# Patient Record
Sex: Male | Born: 1935 | Race: Black or African American | Hispanic: No | Smoking: Former smoker
Health system: Southern US, Community
[De-identification: ages and names within clinical notes are randomized; demographics above are authoritative.]

## PROBLEM LIST (undated history)

## (undated) DIAGNOSIS — I2699 Other pulmonary embolism without acute cor pulmonale: Secondary | ICD-10-CM

## (undated) DIAGNOSIS — I4891 Unspecified atrial fibrillation: Secondary | ICD-10-CM

## (undated) DIAGNOSIS — K219 Gastro-esophageal reflux disease without esophagitis: Secondary | ICD-10-CM

## (undated) DIAGNOSIS — R270 Ataxia, unspecified: Secondary | ICD-10-CM

## (undated) DIAGNOSIS — F329 Major depressive disorder, single episode, unspecified: Secondary | ICD-10-CM

## (undated) DIAGNOSIS — C61 Malignant neoplasm of prostate: Secondary | ICD-10-CM

## (undated) DIAGNOSIS — S7290XA Unspecified fracture of unspecified femur, initial encounter for closed fracture: Secondary | ICD-10-CM

## (undated) DIAGNOSIS — F32A Depression, unspecified: Secondary | ICD-10-CM

## (undated) DIAGNOSIS — F0390 Unspecified dementia without behavioral disturbance: Secondary | ICD-10-CM

---

## 2005-10-26 ENCOUNTER — Ambulatory Visit: Payer: Self-pay

## 2006-06-11 ENCOUNTER — Ambulatory Visit: Payer: Self-pay | Admitting: Internal Medicine

## 2006-12-31 ENCOUNTER — Ambulatory Visit: Payer: Self-pay | Admitting: Otolaryngology

## 2008-06-07 ENCOUNTER — Ambulatory Visit: Payer: Self-pay | Admitting: Internal Medicine

## 2009-01-10 ENCOUNTER — Ambulatory Visit: Payer: Self-pay | Admitting: Neurology

## 2010-10-11 ENCOUNTER — Ambulatory Visit: Payer: Self-pay | Admitting: Pain Medicine

## 2010-10-18 ENCOUNTER — Ambulatory Visit: Payer: Self-pay | Admitting: Pain Medicine

## 2010-10-31 ENCOUNTER — Emergency Department: Payer: Self-pay | Admitting: Emergency Medicine

## 2010-11-01 ENCOUNTER — Ambulatory Visit: Payer: Self-pay | Admitting: Pain Medicine

## 2010-11-14 ENCOUNTER — Inpatient Hospital Stay: Payer: Self-pay | Admitting: Internal Medicine

## 2010-11-16 DIAGNOSIS — I2699 Other pulmonary embolism without acute cor pulmonale: Secondary | ICD-10-CM | POA: Insufficient documentation

## 2010-11-16 LAB — PSA

## 2010-12-03 ENCOUNTER — Other Ambulatory Visit: Payer: Self-pay | Admitting: Geriatric Medicine

## 2014-06-24 DIAGNOSIS — G309 Alzheimer's disease, unspecified: Secondary | ICD-10-CM

## 2014-06-24 DIAGNOSIS — F0150 Vascular dementia without behavioral disturbance: Secondary | ICD-10-CM | POA: Insufficient documentation

## 2014-06-24 DIAGNOSIS — F0280 Dementia in other diseases classified elsewhere without behavioral disturbance: Secondary | ICD-10-CM

## 2014-10-05 DIAGNOSIS — R270 Ataxia, unspecified: Secondary | ICD-10-CM | POA: Insufficient documentation

## 2014-10-05 DIAGNOSIS — W19XXXA Unspecified fall, initial encounter: Secondary | ICD-10-CM | POA: Insufficient documentation

## 2015-01-10 ENCOUNTER — Observation Stay
Admit: 2015-01-10 | Disposition: A | Discharge status: Home / Self Care | Payer: Self-pay | Attending: Internal Medicine | Admitting: Internal Medicine

## 2015-01-10 LAB — COMPREHENSIVE METABOLIC PANEL
ALBUMIN: 3.6 g/dL
ANION GAP: 8 (ref 7–16)
Alkaline Phosphatase: 78 U/L
BILIRUBIN TOTAL: 0.6 mg/dL
BUN: 19 mg/dL
Calcium, Total: 8.6 mg/dL — ABNORMAL LOW
Chloride: 102 mmol/L
Co2: 27 mmol/L
Creatinine: 1.4 mg/dL — ABNORMAL HIGH
GFR CALC AF AMER: 51 — AB
GFR CALC NON AF AMER: 44 — AB
Glucose: 117 mg/dL — ABNORMAL HIGH
Potassium: 4 mmol/L
SGOT(AST): 45 U/L — ABNORMAL HIGH
SGPT (ALT): 7 U/L — ABNORMAL LOW
Sodium: 137 mmol/L
TOTAL PROTEIN: 6.9 g/dL

## 2015-01-10 LAB — URINALYSIS, COMPLETE
BILIRUBIN, UR: NEGATIVE
Bacteria: NONE SEEN
Glucose,UR: NEGATIVE mg/dL (ref 0–75)
Leukocyte Esterase: NEGATIVE
Nitrite: NEGATIVE
Ph: 5 (ref 4.5–8.0)
Protein: NEGATIVE
Specific Gravity: 1.032 (ref 1.003–1.030)

## 2015-01-10 LAB — CBC WITH DIFFERENTIAL/PLATELET
Basophil #: 0.1 10*3/uL (ref 0.0–0.1)
Basophil %: 0.7 %
EOS PCT: 0 %
Eosinophil #: 0 10*3/uL (ref 0.0–0.7)
HCT: 43.8 % (ref 40.0–52.0)
HGB: 14.4 g/dL (ref 13.0–18.0)
Lymphocyte #: 1.7 10*3/uL (ref 1.0–3.6)
Lymphocyte %: 20.6 %
MCH: 31.1 pg (ref 26.0–34.0)
MCHC: 32.8 g/dL (ref 32.0–36.0)
MCV: 95 fL (ref 80–100)
MONOS PCT: 14.8 %
Monocyte #: 1.2 x10 3/mm — ABNORMAL HIGH (ref 0.2–1.0)
NEUTROS PCT: 63.9 %
Neutrophil #: 5.1 10*3/uL (ref 1.4–6.5)
PLATELETS: 195 10*3/uL (ref 150–440)
RBC: 4.61 10*6/uL (ref 4.40–5.90)
RDW: 12.9 % (ref 11.5–14.5)
WBC: 8 10*3/uL (ref 3.8–10.6)

## 2015-01-10 LAB — LACTIC ACID, PLASMA: Lactic Acid, Venous: 2.8 mmol/L

## 2015-01-10 LAB — TROPONIN I: Troponin-I: <0.03 ng/mL

## 2015-01-10 LAB — PROTIME-INR
INR: 1.7
Prothrombin Time: 19.7 secs — ABNORMAL HIGH

## 2015-01-11 LAB — CBC WITH DIFFERENTIAL/PLATELET
BASOS ABS: 0 10*3/uL (ref 0.0–0.1)
Basophil %: 0.3 %
EOS PCT: 0 %
Eosinophil #: 0 10*3/uL (ref 0.0–0.7)
HCT: 40 % (ref 40.0–52.0)
HGB: 13.6 g/dL (ref 13.0–18.0)
LYMPHS PCT: 10.8 %
Lymphocyte #: 1 10*3/uL (ref 1.0–3.6)
MCH: 31.9 pg (ref 26.0–34.0)
MCHC: 34 g/dL (ref 32.0–36.0)
MCV: 94 fL (ref 80–100)
MONOS PCT: 13.7 %
Monocyte #: 1.3 x10 3/mm — ABNORMAL HIGH (ref 0.2–1.0)
Neutrophil #: 7.3 10*3/uL — ABNORMAL HIGH (ref 1.4–6.5)
Neutrophil %: 75.2 %
PLATELETS: 169 10*3/uL (ref 150–440)
RBC: 4.26 10*6/uL — AB (ref 4.40–5.90)
RDW: 12.9 % (ref 11.5–14.5)
WBC: 9.7 10*3/uL (ref 3.8–10.6)

## 2015-01-11 LAB — BASIC METABOLIC PANEL
ANION GAP: 6 — AB (ref 7–16)
BUN: 15 mg/dL
Calcium, Total: 8 mg/dL — ABNORMAL LOW
Chloride: 106 mmol/L
Co2: 26 mmol/L
Creatinine: 1.02 mg/dL
Glucose: 100 mg/dL — ABNORMAL HIGH
Potassium: 3.7 mmol/L
SODIUM: 138 mmol/L

## 2015-01-12 ENCOUNTER — Other Ambulatory Visit: Payer: Self-pay | Admitting: Physician Assistant

## 2015-01-12 DIAGNOSIS — I471 Supraventricular tachycardia: Secondary | ICD-10-CM

## 2015-01-12 DIAGNOSIS — R4182 Altered mental status, unspecified: Secondary | ICD-10-CM | POA: Diagnosis not present

## 2015-01-12 DIAGNOSIS — I480 Paroxysmal atrial fibrillation: Secondary | ICD-10-CM

## 2015-01-12 DIAGNOSIS — N179 Acute kidney failure, unspecified: Secondary | ICD-10-CM | POA: Diagnosis not present

## 2015-01-12 LAB — BASIC METABOLIC PANEL
Anion Gap: 5 — ABNORMAL LOW (ref 7–16)
BUN: 14 mg/dL
CALCIUM: 7.9 mg/dL — AB
CO2: 26 mmol/L
Chloride: 109 mmol/L
Creatinine: 0.91 mg/dL
Glucose: 88 mg/dL
POTASSIUM: 3.8 mmol/L
Sodium: 140 mmol/L

## 2015-01-12 LAB — CBC WITH DIFFERENTIAL/PLATELET
Basophil #: 0 10*3/uL (ref 0.0–0.1)
Basophil %: 0.3 %
Eosinophil #: 0 10*3/uL (ref 0.0–0.7)
Eosinophil %: 0.1 %
HCT: 38.2 % — ABNORMAL LOW (ref 40.0–52.0)
HGB: 12.8 g/dL — ABNORMAL LOW (ref 13.0–18.0)
Lymphocyte #: 2.2 10*3/uL (ref 1.0–3.6)
Lymphocyte %: 20.8 %
MCH: 31.3 pg (ref 26.0–34.0)
MCHC: 33.4 g/dL (ref 32.0–36.0)
MCV: 94 fL (ref 80–100)
Monocyte #: 1.4 x10 3/mm — ABNORMAL HIGH (ref 0.2–1.0)
Monocyte %: 13.6 %
Neutrophil #: 6.8 10*3/uL — ABNORMAL HIGH (ref 1.4–6.5)
Neutrophil %: 65.2 %
PLATELETS: 145 10*3/uL — AB (ref 150–440)
RBC: 4.07 10*6/uL — ABNORMAL LOW (ref 4.40–5.90)
RDW: 12.8 % (ref 11.5–14.5)
WBC: 10.4 10*3/uL (ref 3.8–10.6)

## 2015-01-12 LAB — MAGNESIUM: MAGNESIUM: 1.7 mg/dL

## 2015-01-12 LAB — TSH: Thyroid Stimulating Horm: 2.477 u[IU]/mL

## 2015-01-12 LAB — CULTURE, BLOOD (SINGLE)

## 2015-01-12 LAB — PROTIME-INR
INR: 2
PROTHROMBIN TIME: 22.7 s — AB

## 2015-01-13 LAB — PROTIME-INR
INR: 2.3
Prothrombin Time: 25.2 secs — ABNORMAL HIGH

## 2015-01-14 ENCOUNTER — Other Ambulatory Visit: Payer: Self-pay

## 2015-01-14 LAB — PROTIME-INR
INR: 3.3
Prothrombin Time: 33.6 secs — ABNORMAL HIGH

## 2015-01-16 NOTE — H&P (Signed)
PATIENT NAME:  Jorge Moore, Jorge Moore MR#:  [id number] DATE OF BIRTH:  [birth year]  DATE OF ADMISSION:  01/10/2015  REFERRING PHYSICIAN:  Wandra Arthurs, MD    PRIMARY CARE PHYSICIAN:  Cheral Marker. Ola Spurr, MD    CHIEF COMPLAINT: Weakness.   HISTORY OF PRESENT ILLNESS: This is an 81 year old African American gentleman who is from [employer], history of dementia, chronic atrial fibrillation presenting with weakness. The patient unable to provide any meaningful information given mental status and medical condition given mental status at baseline. History obtained from family present at the bedside and they are stating that he has had progressively worsening weakness for the last 1 to 2 day duration as well as associated cough of 1 duration which has been nonproductive. No fevers, chills, further symptomatology that they have noted. He was sent to the Emergency Department for worsening weakness from [facility]. On arrival noted be in atrial fibrillation, right ventricular response, heart rate 140s, received Cardizem x 1 with marked improvement of his heart rate.   REVIEW OF SYSTEMS: Unobtainable given patient's mental status and medical condition.   PAST MEDICAL HISTORY: Includes chronic atrial fibrillation, on warfarin for anticoagulation, as well as dementia with poor short-term memory, history of throat cancer many years ago.   SOCIAL HISTORY: No alcohol, tobacco, or drug usage. Resides at [facility]. Uses a walker for ambulation.   FAMILY HISTORY: No known cardiovascular or pulmonary disorders.   ALLERGIES: PENICILLIN.   HOME MEDICATIONS: Include acetaminophen 325 mg 2 tablets p.o. q. 4 hours as needed, acetaminophen/hydrocodone 325/5 mg 1 to 2 tablets every 4 hours as needed, magnesium hydroxide 30 mL every 12 hours as needed, warfarin 7.5 mg p.o. q. daily, fluoxetine 10 mg p.o. q. daily, Aricept 10 mg p.o. q. daily, calcitonin 200 international units nasal  spray daily, Colace 100 mg p.o. b.i.d., Namenda 10 mg p.o. daily, Protonix  40 mg p.o. q. daily, calcium plus vitamin D 500/200 p.o. b.i.d.   PHYSICAL EXAMINATION:  VITAL SIGNS: Temperature 98.7, heart rate 101, respirations 22, blood pressure 122/83, saturating well on supplemental O2, weight 71.2 kg, BMI 24.6.  GENERAL: Chronically ill, frail-appearing African American  gentleman currently in minimal distress given mental status.  HEAD: Normocephalic, atraumatic.  EYES: Pupils equal, round, react to light. Extraocular muscles intact. No scleral icterus.  MOUTH: Dry mucosal membrane. Dentition poor. No abscess noted.  EAR, NOSE, THROAT: Clear without exudates. No external lesions.  NECK: Supple. No thyromegaly. No nodules. No JVD.  PULMONARY: Diminished breath sounds throughout all  lung fields secondary to poor respiratory effort, however, no frank wheezes, rales, or rhonchi. No use of accessory muscles. Poor air entry as stated above.  CHEST: Nontender to palpation.  CARDIOVASCULAR: S1, S2, irregular rate, irregular rhythm. No murmurs, rubs, or gallops. No edema. Pedal pulses 2+ bilaterally. GASTROINTESTINAL: Soft, nontender, nondistended. No masses. Positive bowel sounds. No hepatosplenomegaly.  MUSCULOSKELETAL: No swelling, clubbing, or edema. Range of motion full in all extremities.  NEUROLOGIC: Cranial nerves II through XII intact. No gross focal neurological deficit. Sensation intact. Reflexes intact.  SKIN: No ulceration, lesions, rash, cyanosis. Skin warm and dry. Turgor intact.  PSYCHIATRIC: Mood, affect flat. He is awake, alert, oriented to person only. Insight, judgment poor.   LABORATORY DATA: A multitude of x-rays. Chest x-ray performed: Left sided atelectasis. CT head performed: No acute findings. CT chest performed: No PE, progressive scarring of the lingula anterolateral left lower lobe, minimal bibasilar atelectasis.   Remainder of  laboratory data: Sodium 137, potassium 4,  chloride 102, bicarbonate 27, BUN 19, creatinine 1.4, glucose 117. LFTs: AST of 45, otherwise, within normal limits. WBC of 8, hemoglobin of 14.4, platelets of 195,000. Urinalysis negative for evidence of infection.   ASSESSMENT AND PLAN: An 81 year old African American  gentleman with history of dementia as well as chronic atrial fibrillation  presenting with weakness.  1.  Acute kidney injury. IV  fluid hydration, follow urine output and renal function.  2.  Atrial fibrillation with rapid ventricular response transiently.   place on telemetry, Cardizem if required, continue anticoagulation. Check an INR.  4.  Generalized weakness. We will consult physical therapy.  5.  Venous thromboembolic prophylaxis with therapeutic warfarin.   CODE STATUS: The patient is full code.   TIME SPENT: 45 minutes.   ____________________________ Aaron Mose. Hower, MD dkh:AT D: 01/11/2015 00:07:12 ET T: 01/11/2015 00:55:58 ET JOB#: [id number]  cc: Aaron Mose. Hower, MD, <Dictator> DAVID Woodfin Ganja MD ELECTRONICALLY SIGNED 01/13/2015 10:43

## 2015-01-16 NOTE — Consult Note (Signed)
General Aspect Primary Cardiologist: New to [facility] ____________  81 year old male with history of PAF on Coumadin, PE 11/2010, dementia, poor short-term memory, prostate CA s/p prostatectomy, DM2, remote history of throat CA, and glaucoma who presented to [facility] on 4/26 with 1-2 day history of generalized weakness, fatigue, and cough.  ____________  PMH: 1. PAF on Coumadin 2. PE 11/2010 3. Dementia 4. Poor short term memory 5. Prostate CA s/p prostatectomy 6. DM2 7. Remote history of throat CA 8. Glaucoma ____________   Present Illness 81 year old male with the above problem list who [facility] above CC. He has known history of PAF on Coumadin that his managed by his PCP. There is no known ischemic history on him. He has never seen a cardiologist. The history is taken from the prior notes and his daughter. He is not listed as being on any rate controlling medications for his a-fib in [place] or [place], only warfarin.   He comes in with a 1-2 day history of gneralized weakness, fatigue, and cough, which has been nonproductive. No fevers, chills, or other symptoms.  On arrival noted be in atrial fibrillation, right ventricular response, heart rate 140s, received Cardizem x 1 with marked improvement of his heart rate. Labs showed lactic acid 2.8, SCr 1.40, troponin 0.03, blood culture negative x 2 to date, CXR showed no acute process, chronic scarring. CT head without acute proces, and with plaque along the carotid bulbs. CT chest for PE was negative. It did show mild CAD, and atlecetasis. Today it was observed on tele that he had a short episode of SVT with abberancy, then went back into NSR with HR in the 70s. EKG on 4/25 showed SVT with HR 153. He was apparently asymptomatic. Vitals stable.   Physical Exam:  GEN frail appearing   HEENT PERRL, hearing intact to voice, moist oral mucosa, drooping face at ba[place] - corrects with active motion   NECK supple  no JVD   RESP normal  resp effort  wheezing  rhonchi   CARD Regular rate and rhythm  No murmur   ABD denies tenderness  soft   EXTR negative edema   SKIN normal to palpation   NEURO motor/sensory function intact   PSYCH lethargic   Review of Systems:  ROS Pt not able to provide ROS  dementia   Medications/Allergies Reviewed Medications/Allergies reviewed   Family & Social History:  Family and Social History:  Family History dementia   Social History negative tobacco, negative ETOH, negative Illicit drugs   Place of Living Nursing Home     Atrial Fibrillation:    Alzheimer's Disease:    Throat Cancer: radiation therapy   Prostatectomy: prostate cancer  Home Medications: Medication Instructions Status  fluoxetine 10 mg oral capsule 1  orally once a day  Active  Aricept 10 mg oral tablet 1  orally once a day  Active  Namenda 10 mg oral tablet 1  orally once a day  Active  Protonix 40 mg granule, enteric coated 1 ea orally once a day  Active  acetaminophen-HYDROcodone 325 mg-5 mg tablet 1  to 2 tab(s) orally every 4 hours as needed    Active  calcitonin 200 intl units/inh spray 1 spray(s) nasal once a day  Active  calcium (as carbonate)-vitamin D 500 mg-200 intl units oral tablet 1 tab(s) orally 2 times a day (with meals)  Active  docusate sodium 100 mg capsule 1 cap(s) orally 2 times a  day  Active  magnesium hydroxide 8% suspension 30 mL orally every 12 hours as needed   Active  acetaminophen 325 mg tablet 2 tab(s) orally every 4 hours as needed   Active  warfarin 7.5 mg tablet 1 tab(s) orally once a day  Active   Lab Results:  Routine Chem:  27-Apr-16 07:22   Glucose, Serum 88 (65-99 NOTE: New Reference Range  11/23/14)  BUN 14 (6-20 NOTE: New Reference Range  11/23/14)  Creatinine (comp) 0.91 (0.61-1.24 NOTE: New Reference Range  11/23/14)  Sodium, Serum 140 (135-145 NOTE: New Reference Range  11/23/14)  Potassium, Serum 3.8 (3.5-5.1 NOTE: New Reference Range  11/23/14)   Chloride, Serum 109 (101-111 NOTE: New Reference Range  11/23/14)  CO2, Serum 26 (22-32 NOTE: New Reference Range  11/23/14)  Calcium (Total), Serum  7.9 (8.9-10.3 NOTE: New Reference Range  11/23/14)  Anion Gap  5  eGFR (African American) >60  eGFR (Non-African American) >60 (eGFR values <48m/min/1.73 m2 may be an indication of chronic kidney disease (CKD). Calculated eGFR is useful in patients with stable renal function. The eGFR calculation will not be reliable in acutely ill patients when serum creatinine is changing rapidly. It is not useful in patients on dialysis. The eGFR calculation may not be applicable to patients at the low and high extremes of body sizes, pregnant women, and vegetarians.)  Routine Coag:  27-Apr-16 07:22   Prothrombin  22.7 (11.4-15.0 NOTE: New Reference Range  10/15/14)  INR 2.0 (INR reference interval applies to patients on anticoagulant therapy. A single INR therapeutic range for coumarins is not optimal for all indications; however, the suggested range for most indications is 2.0 - 3.0. Exceptions to the INR Reference Range may include: Prosthetic heart valves, acute myocardial infarction, prevention of myocardial infarction, and combinations of aspirin and anticoagulant. The need for a higher or lower target INR must be assessed individually. Reference: The Pharmacology and Management of the Vitamin K  antagonists: the seventh ACCP Conference on Antithrombotic and Thrombolytic Therapy. CALPFX.9024Sept:126 (3suppl): [id number] A HCT value >55% may artifactually increase the PT.  In one study,  the increase was an average of 25%. Reference:  "Effect on Routine and Special Coagulation Testing Values of Citrate Anticoagulant Adjustment in Patients with High HCT Values." American Journal of Clinical Pathology 2006;126:400-405.)  Routine Hem:  27-Apr-16 07:22   WBC (CBC) 10.4  RBC (CBC)  4.07  Hemoglobin (CBC)  12.8  Hematocrit (CBC)  38.2   Platelet Count (CBC)  145  MCV 94  MCH 31.3  MCHC 33.4  RDW 12.8  Neutrophil % 65.2  Lymphocyte % 20.8  Monocyte % 13.6  Eosinophil % 0.1  Basophil % 0.3  Neutrophil #  6.8  Lymphocyte # 2.2  Monocyte #  1.4  Eosinophil # 0.0  Basophil # 0.0 (Result(s) reported on 12 Jan 2015 at 08:25AM.)   Radiology Results: Cardiology:    25-Apr-16 18:30, ECG  Ventricular Rate 153  Atrial Rate 153  P-R Interval 168  QRS Duration 80  QT 316  QTc 504  R Axis 27  T Axis -106  ECG interpretation   Sinus tachycardia  Septal infarct , age undetermined  Marked ST abnormality, possible inferior subendocardial injury  Abnormal ECG  When compared with ECG of 14-Nov-2010 09:26,  Significant changes have occurred  ----------unconfirmed----------  Confirmed by OVERREAD, NOT ([id number]), editor PEARSON, BARBARA ([id number] on 01/11/2015 9:50:20 AM    25-Apr-16 20:58, ECG  Ventricular Rate 108  Atrial Rate 108  P-R Interval 172  QRS Duration 84  QT 332  QTc 444  P Axis 52  R Axis 38  T Axis 38  ECG interpretation   Sinus tachycardia  Nonspecific T wave abnormality  Abnormal ECG  When compared with ECG of 14-Nov-2010 09:26,  Vent. rate has increased BY  39 BPM  Nonspecific T wave abnormality, worse in Inferior leads  Nonspecific T wave abnormality now evident inAnterolateral leads  ----------unconfirmed----------  Confirmed by OVERREAD, NOT ([id number]), editor PEARSON, BARBARA ([id number]) on 01/11/2015 1:14:22 PM  ECG   CT:    25-Apr-16 19:39, CT Angiography Chest  CT Angiography Chest   REASON FOR EXAM:    SOB r/o PE  COMMENTS:       PROCEDURE: CT  - CT ANGIOGRAPHY CHEST W/CONTRAST  - Jan 10 2015  7:39PM     CLINICAL DATA:  Weakness and loss of balance. Fall with left leg  pain. Shortness of breath. History of throat cancer.    EXAM:  CT ANGIOGRAPHY CHEST WITH CONTRAST    TECHNIQUE:  Multidetector CT imaging of the chest was performed using the  standard protocol during bolus administration of  intravenous  contrast. Multiplanar CT image reconstructions and MIPs were  obtained toevaluate the vascular anatomy.    CONTRAST:  75 mL Omnipaque 350 IV    COMPARISON:  11/14/2010 and chest x-ray 01/10/2015, 11/16/2010    FINDINGS:  Lungs are well inflated and demonstrate scarring over the lingula  and anterior lateral left lower lobe slightly worse compared to  2012. Minimal bibasilar dependent atelectasis. No evidence of  effusion. Subtle increased peripheral interstitial markings over the  mid to upper lungs with slight progression compared to the previous  exam. Airways are within normal.    Heart is normal in size. There is calcified plaque over the left  anterior descending and lateral circumflex coronary arteries  unchanged. There is no evidence of pulmonary embolism. Minimal  prominence of the ascending thoracic aortameasuring 3.6 cm.  Calcified subcarinal lymph nodes are present. 1 cm precarinal lymph  node likely reactive. No hilar or axillary adenopathy. Moderate size  hiatal hernia unchanged.    Images through the upper abdomen are within normal. Moderate  compression fracture near the thoracolumbar junction unchanged.    Review of the MIP images confirms the above findings.     IMPRESSION:  No acute cardiopulmonary disease and no evidence of pulmonary  embolism.  Slightly progressive scarring over the lingula/anterior lateral left  lower lobe. Minimal bibasilar atelectasis. Slight interval  progression of mild peripheral increased interstitial markings over  the mid to upper lungs. 1 cm precarinal lymph node likely reactive.    Mild atherosclerotic coronary artery disease.    Stable moderate size hiatal hernia.    Mild prominence of the ascending thoracic aorta measuring 3.6 cm.  Recommend annual imaging followup by CTA or MRA. This recommendation  follows 2010 ACCF/AHA/AATS/ACR/ASA/SCA/SCAI/SIR/STS/SVM Guidelines  for the Diagnosis and Management of Patients  with Thoracic Aortic  Disease. Circulation.2010; 121: Z610-R604.  Stable compression fracture over the thoracolumbar junction.      Electronically Signed    By: Marin Olp M.D.    On:01/10/2015 20:29         Verified By: Pearletha Alfred, M.D.,    25-Apr-16 19:39, CT Head Without Contrast  CT Head Without Contrast   REASON FOR EXAM:    fall  COMMENTS:       PROCEDURE: CT  - CT HEAD WITHOUT CONTRAST  -  Jan 10 2015  7:39PM     CLINICAL DATA:  Post fall    EXAM:  CT HEAD WITHOUT CONTRAST    CT CERVICAL SPINE WITHOUT CONTRAST    TECHNIQUE:  Multidetector CT imaging of the head and cervical spine was  performed following the standard protocol without intravenous  contrast. Multiplanar CT image reconstructions of the cervical spine  were also generated.    COMPARISON:  01/10/2009    FINDINGS:  CT HEAD FINDINGS    Similar findings of atrophy with sulcal prominence and centralized  volume loss with commensurate ex vacuo dilatation of the ventricular  system. Unchanged encephalomalacia involving the medial aspect of  the left cerebellar hemisphere (image 8, series 2). Extensive  periventricular hypodensities compatible microvascular ischemic  disease. Unchanged bilateral basal ganglial calcifications. Given  extensive background parenchymal abnormalities, there is no CT  evidence of superimposed acute large territory infarct.    No intraparenchymal or extra-axial mass or hemorrhage. Unchanged  size and configuration of the ventricles and basilar cisterns. No  midline shift. Intracranial atherosclerosis. Limited visualization  of the paranasal sinuses and mastoid air cells is normal. No  air-fluid levels.    Regional soft tissues appear normal. Debris is noted within the  bilateral external auditory canals, right greater than left. No  displaced calvarial fracture.    CT CERVICAL SPINE FINDINGS    C1 to the superior endplate of T3 is imaged.  There is straightening  and reversal of the expected cervical  lordosis with kyphosis centered about the C5-C6 articulation. No  anterolisthesis or retrolisthesis. The bilateral facets are normally  aligned. The dens is normally positioned between the lateral masses  of C1. Moderate degenerative change of the atlantodental  articulation. Normal atlantoaxial articulations.    No fracture or static subluxation of the cervical spine. Cervical  vertebral body heights are preserved. Prevertebral soft tissues are  normal.    Moderate to severe multilevel cervical spine DDD, worse at C5-C6,  C6-C7 and C7-T1 with disc space height loss, endplate irregularity  and sclerosis.  Atherosclerotic plaque within the bilateral carotid bulbs, right  greater than left. No bulky cervical lymphadenopathy on this  noncontrast examination. Limited visualization lung apices is  normal.     IMPRESSION:  Head CT Impression:    1. No acute intracranial process  2. Similar findings of prior infarct involving the medial aspect of  the left cerebellum as well as advanced atrophy and microvascular  ischemic disease.  Cervical spine Impression:    1. No fracture static subluxation of the cervical spine.  2. Straightening and focal kyphosis centered about the C5-C6  articulation without associated anterolisthesis.  3. Moderate to severe multilevel cervical spine DDD, worse at C5-C6,  C6-C7 and C7-T1.  4. Atherosclerotic plaque within the bilateral carotid bulbs.  Further evaluation with nonemergent carotid Doppler ultrasound could  be performed as clinically indicated.      Electronically Signed    By: Sandi Mariscal M.D.    On: 01/10/2015 20:23         Verified By: Aileen Fass, M.D.,    Penicillin: Other  Vital Signs/Nurse's Notes: **Vital Signs.:   27-Apr-16 05:29  Vital Signs Type Routine  Temperature Temperature (F) 99.4  Celsius 37.4  Temperature Source oral  Pulse Pulse 74  Respirations Respirations 20   Systolic BP Systolic BP 989  Diastolic BP (mmHg) Diastolic BP (mmHg) 65  Mean BP 84  Pulse Ox % Pulse Ox % 93  Pulse Ox  Activity Level  At rest  Oxygen Delivery 2L    Impression 81 year old male with history of PAF on Coumadin, PE 11/2010, dementia, poor short-term memory, prostate CA s/p prostatectomy, DM2, remote history of throat CA, and glaucoma who [facility] on 4/[facility] with 1-2 day history of generalized weakness, fatigue, and cough.  1. SVT: -EKG 4/25 showed SVT with heart rate 153 -Telemetry today showed what appears to be short episode of SVT with abberancy before converting back to NSR with heart rate of 70s -Check TSH and Mg -Start Lopressor 12.5 mg bid -No echo needed at this time given his advanced dementia and short term memory loss (treatment would still be beta blocker, would not pursue ischemic evaluation if images suggested - daughter agrees)  2. PAF: -Given his advanced age and dementia could discontinue warfarin and likely treat with aspirin -Add Lopressor as above -In NSR currently  3. Altered mental status: -Fever earlier, now resolved -Work up unrevealing so far -Continue to monitor  4. Dementia: -Continue home medications -No aggressive interventions planned from cardiac standpoint  5. Acute renal insufficiency: -Improved   Plan ATTENDING ATTESTATION  I personally saw & examined the patient today with Mr. Idolina Primer, [place].  Together, we reviewed the chart & all available data & discussed findings & recommendations on rounds.  I agree with the note  On Tele - short run of a wider complex rhythm (~6 beats) with retrograde P waves -- morphology is more c/2w SVT with aberrancy, but cannot exclude short burst of NSVT.  He has PAF on warfarin & was in RVR on admission.   With age & dementia - would not be aggressive with evaluation after 1 short episode.  if he has recurrent episodes would consider Echo to exclude low EF / cardiomyopathy (simply b/c BB would be  preferred to CCB for Afib rate control).   BB is likely better than CCB for SVT - agree with low dose BB.    We will follow Tele along with you, but do not have much more to offer.  Will sign off & see PRN MD request.  DH   Electronic Signatures: Rise Mu (PA-C)  (Signed 27-Apr-16 14:18)  Authored: General Aspect/Present Illness, History and Physical Exam, Review of System, Family & Social History, Past Medical History, Home Medications, Labs, Radiology, Allergies, Vital Signs/Nurse's Notes, Impression/Plan Leonie Man (MD)  (Signed 27-Apr-16 20:05)  Authored: Impression/Plan  Co-Signer: General Aspect/Present Illness, History and Physical Exam, Review of System, Family & Social History, Past Medical History, Home Medications, Labs, Radiology, Allergies, Vital Signs/Nurse's Notes, Impression/Plan   Last Updated: 27-Apr-16 20:05 by Leonie Man (MD)

## 2015-01-17 NOTE — Discharge Summary (Addendum)
PATIENT NAME:  Jorge Moore, Jorge Moore MR#:  [id number] DATE OF BIRTH:  09/27/1923  DATE OF ADMISSION:  01/10/2015 DATE OF DISCHARGE:  01/14/2015  DISCHARGE DIAGNOSES: 1.  Paroxysmal supraventricular tachycardia. 2.  Atrial fibrillation with rapid ventricular response.  3.  Bereavement reaction.    CONSULTATIONS: Cardiology, Dr. Donivan Scull group.   CODE STATUS: FULL.   DISCHARGE MEDICATIONS: Aricept 10 mg p.o. once daily, fluoxetine 10 mg 1 tablet p.o. once daily, Namenda 10 mg once daily, Protonix 40 mg 1 tablet p.o. once daily, calcitonin 200 international units 1 spray nasally once daily, calcium with vitamin D 1 tablet p.o. 2 times a day, Colace 100 mg 1 capsule p.o. 2 times a day, magnesium hydroxide 30 mL p.o. every 12 hours as needed, Tylenol 325 mg 2 tablets p.o. every 4 hours as needed, metoprolol 12.5 mg p.o. b.i.d., Tylenol with Percocet 1 tablet p.o. every 4 hours as needed for pain.   DISPOSITION: Discharging home with home health, physical therapy, nurse aide, and hospice services. No need of home oxygen.  DISCHARGE DIET: Low fat, low cholesterol. Dietary supplement: Ensure 2 times per day.   DISCHARGE ACTIVITY: As recommended by physical therapy.   DISCHARGE FOLLOWUP: Followup appointment in 1 week with primary care physician and 2 to 3 weeks with cardiology, Dr. Rockey Situ.  BRIEF HISTORY AND PHYSICAL AND HOSPITAL COURSE: The patient is an 81 year old pleasant African American male who came into the ED with the chief complaint of generalized weakness. The patient is sent over from [place] facility. He has chronic history of Alzheimer dementia and chronic atrial fibrillation. Please review history and physical for details.   HOSPITAL COURSE BASED ON PROBLEM: 1.  Acute kidney injury. Started him on IV fluids. Urinary output and renal function was monitored. With IV fluids the patient's renal function is back to normal.  2.  A-fib with RVR. The patient was placed on  telemetry, Coumadin was continued, INR was monitored and Coumadin dose was adjusted by pharmacy. The plan was to provide Cardizem as needed basis but rate was well-controlled. Eventually, the patient was seen by cardiology, Dr. Donivan Scull group, and they have recommended to discontinue Coumadin and start him on aspirin in view of elderly age, high risk for falls and bleeding. TSH was normal.  3.  Altered mental status, significantly improved, but was thought to be from metabolic encephalopathy. Initially had low-grade fever x1, but no cultures were done. The thought was it could be from viral infection. UA was negative. CT head was negative. No pneumonia was noted on the chest x-ray so did not give any antibiotics. No other episodes of temperature was noticed, and the patient's physical condition and altered mental status was improved and yesterday he was talking and smiling, almost back to his baseline. That was on Feb 07, 2023, morning. 4.  Episode of paroxysmal SVT. Heart rate went up to 150s transiently per telemetry. Cardiology has seen the patient. The patient spontaneously converted back to A-fib so they have recommended small dose of metoprolol 12.5 mg p.o. b.i.d. and Coumadin was discontinued as he is at high risk for bleeding if he falls.  5.  Chronic history of Alzheimer dementia at baseline and he resides at Alzheimer locked unit at [facility]. The plan is to continue Aricept and Namenda as his mentation was much better.  6.  Bereavement reaction. The patient's wife suddenly passed on 02/07/23. The patient was sad following that, but he has very strong family support. The patient was  coping okay. He will be transferred back to [facility] with hospice care. Care management was consulted regarding that.  7.  Generalized weakness. The patient was seen by physical therapy. They have recommended skilled nursing care, but family cannot afford skilled nursing facility as insurance does not cover so  the patient is going back to [facility] with hospice care and home health with RN, PT and aide. This was discussed with the family members in detail. They are aware of the plan. I have discussed with his daughter. She is agreeable. If the patient is not improving, he might need outpatient psych followup.   CONDITION AT THE TIME OF DISCHARGE: Satisfactory.   LABORATORY AND IMAGING STUDIES: On the 27th, BMP was normal, except calcium is 7.9. TSH normal. Troponin less than 0.03. WBC 10.4, hemoglobin 12.8, hematocrit 38.2, and platelets 145,000. INR is 3.3 today. PT is 33.6. Coumadin was discontinued.   Blood culture with no growth.   Chest x-ray, PA and lateral: No evidence of acute cardiopulmonary disease.  Pelvic x-ray: No fracture or dislocation seen.   The plan of care was discussed with the patient and daughter. Daughter is agreeable.  TOTAL TIME SPENT ON DISCHARGE:  45 minutes.  ____________________________ Nicholes Mango, MD ag:sb D: 01/14/2015 16:30:45 ET T: 01/14/2015 17:00:04 ET JOB#: [id number]  cc: Nicholes Mango, MD, <Dictator> [place] [facility] Minna Merritts, MD  Nicholes Mango MD ELECTRONICALLY SIGNED 01/26/2015 14:22

## 2015-01-18 ENCOUNTER — Telehealth: Payer: Self-pay | Admitting: *Deleted

## 2015-01-18 NOTE — Telephone Encounter (Signed)
Spoke to patient daughter, I tried to make an apt for hosptial follow up,  She stated he is okay from heart stand point she would call if they had any issues.  Pt wife just passed away so they are handling this right now.

## 2015-01-20 ENCOUNTER — Emergency Department
Admission: EM | Admit: 2015-01-20 | Discharge: 2015-01-20 | Disposition: A | Discharge status: Home / Self Care | Attending: Emergency Medicine | Admitting: Emergency Medicine

## 2015-01-20 ENCOUNTER — Encounter: Payer: Self-pay | Admitting: Emergency Medicine

## 2015-01-20 ENCOUNTER — Other Ambulatory Visit: Payer: Self-pay

## 2015-01-20 ENCOUNTER — Emergency Department

## 2015-01-20 DIAGNOSIS — Y9389 Activity, other specified: Secondary | ICD-10-CM | POA: Diagnosis not present

## 2015-01-20 DIAGNOSIS — Y998 Other external cause status: Secondary | ICD-10-CM | POA: Insufficient documentation

## 2015-01-20 DIAGNOSIS — Z7901 Long term (current) use of anticoagulants: Secondary | ICD-10-CM | POA: Diagnosis not present

## 2015-01-20 DIAGNOSIS — W19XXXA Unspecified fall, initial encounter: Secondary | ICD-10-CM

## 2015-01-20 DIAGNOSIS — W1839XA Other fall on same level, initial encounter: Secondary | ICD-10-CM | POA: Diagnosis not present

## 2015-01-20 DIAGNOSIS — Y92129 Unspecified place in nursing home as the place of occurrence of the external cause: Secondary | ICD-10-CM

## 2015-01-20 DIAGNOSIS — Z79899 Other long term (current) drug therapy: Secondary | ICD-10-CM | POA: Insufficient documentation

## 2015-01-20 DIAGNOSIS — Y92128 Other place in nursing home as the place of occurrence of the external cause: Secondary | ICD-10-CM | POA: Diagnosis not present

## 2015-01-20 DIAGNOSIS — Z880 Allergy status to penicillin: Secondary | ICD-10-CM | POA: Diagnosis not present

## 2015-01-20 DIAGNOSIS — S79911A Unspecified injury of right hip, initial encounter: Secondary | ICD-10-CM | POA: Insufficient documentation

## 2015-01-20 DIAGNOSIS — M25551 Pain in right hip: Secondary | ICD-10-CM

## 2015-01-20 HISTORY — DX: Major depressive disorder, single episode, unspecified: F32.9

## 2015-01-20 HISTORY — DX: Malignant neoplasm of prostate: C61

## 2015-01-20 HISTORY — DX: Unspecified dementia, unspecified severity, without behavioral disturbance, psychotic disturbance, mood disturbance, and anxiety: F03.90

## 2015-01-20 HISTORY — DX: Gastro-esophageal reflux disease without esophagitis: K21.9

## 2015-01-20 HISTORY — DX: Depression, unspecified: F32.A

## 2015-01-20 LAB — COMPREHENSIVE METABOLIC PANEL
ALT: 11 U/L — AB (ref 17–63)
AST: 27 U/L (ref 15–41)
Albumin: 2.7 g/dL — ABNORMAL LOW (ref 3.5–5.0)
Alkaline Phosphatase: 60 U/L (ref 38–126)
Anion gap: 7 (ref 5–15)
BUN: 14 mg/dL (ref 6–20)
CALCIUM: 8.4 mg/dL — AB (ref 8.9–10.3)
CO2: 30 mmol/L (ref 22–32)
Chloride: 102 mmol/L (ref 101–111)
Creatinine, Ser: 0.94 mg/dL (ref 0.61–1.24)
GFR calc non Af Amer: >60 mL/min (ref >60)
GLUCOSE: 122 mg/dL — AB (ref 65–99)
Potassium: 3.8 mmol/L (ref 3.5–5.1)
Sodium: 139 mmol/L (ref 135–145)
TOTAL PROTEIN: 6.9 g/dL (ref 6.5–8.1)
Total Bilirubin: 2.1 mg/dL — ABNORMAL HIGH (ref 0.3–1.2)

## 2015-01-20 LAB — CBC
HCT: 36.5 % — ABNORMAL LOW (ref 40.0–52.0)
HEMOGLOBIN: 11.9 g/dL — AB (ref 13.0–18.0)
MCH: 30.7 pg (ref 26.0–34.0)
MCHC: 32.5 g/dL (ref 32.0–36.0)
MCV: 94.4 fL (ref 80.0–100.0)
PLATELETS: 347 10*3/uL (ref 150–440)
RBC: 3.87 MIL/uL — AB (ref 4.40–5.90)
RDW: 12.5 % (ref 11.5–14.5)
WBC: 12.7 10*3/uL — ABNORMAL HIGH (ref 3.8–10.6)

## 2015-01-20 LAB — URINALYSIS COMPLETE WITH MICROSCOPIC (ARMC ONLY)
BACTERIA UA: NONE SEEN
Bilirubin Urine: NEGATIVE
GLUCOSE, UA: NEGATIVE mg/dL
LEUKOCYTES UA: NEGATIVE
NITRITE: NEGATIVE
Protein, ur: NEGATIVE mg/dL
SPECIFIC GRAVITY, URINE: 1.025 (ref 1.005–1.030)
pH: 6 (ref 5.0–8.0)

## 2015-01-20 LAB — TROPONIN I

## 2015-01-20 LAB — PROTIME-INR
INR: 2.91
Prothrombin Time: 30.5 seconds — ABNORMAL HIGH (ref 11.4–15.0)

## 2015-01-20 NOTE — ED Notes (Signed)
Patient is resting comfortably. 

## 2015-01-20 NOTE — ED Notes (Signed)
Pt to ed from [facility] with reports of getting tangled up in his sheets this am and falling. Pt grimaces when palpating right hip area. Pt with dementia and is on hospice care for FTT after recently losing his wife. Pt a/o on arrival to ed with edp at bedside.

## 2015-01-20 NOTE — ED Notes (Signed)
Family at bedside. 

## 2015-01-20 NOTE — ED Notes (Signed)
Pt will be transported back to [facility] via EMS>

## 2015-01-20 NOTE — ED Notes (Signed)
Patient resting in stretcher. Respirations even and unlabored. No obvious distress. Cardiac monitor in place. No needs/concerns verbalized at this time. Family at bedside. Call bell within reach. Encouraged to call with needs. Will continue to monitor.

## 2015-01-20 NOTE — Discharge Instructions (Signed)

## 2015-01-20 NOTE — ED Notes (Signed)
Xray completed and negative. Cont to monitor.

## 2015-01-20 NOTE — ED Provider Notes (Signed)
[facility] Emergency Department Provider Note    ____________________________________________  Time seen: 7:15 AM, on arrival  I have reviewed the triage vital signs and the nursing notes.   HISTORY  Chief Complaint No chief complaint on file.       HPI Jorge Moore is a 81 y.o. male who presents from [place] home. Apparently he recently entered hospice for failure to thrive. Patient was found next to his bed tangled in blankets. Nursing home staff suspects unwitnessed fall. Patient states he hurts all over but primarily in his right hip. The pain is moderate 5 out of 10 and sharp with movement. Holding the leg still makes the pain better but movement hurts.  No past medical history on file.  There are no active problems to display for this patient.   No past surgical history on file.  Current Outpatient Rx  Name  Route  Sig  Dispense  Refill  . acetaminophen (TYLENOL) 325 MG tablet   Oral   Take 650 mg by mouth every 4 (four) hours as needed.         . Calcium Carbonate-Vitamin D (CALCIUM-VITAMIN D) 500-200 MG-UNIT per tablet   Oral   Take 1 tablet by mouth 2 (two) times daily with a meal.         . docusate sodium (COLACE) 100 MG capsule   Oral   Take 100 mg by mouth 2 (two) times daily.         Marland Kitchen donepezil (ARICEPT) 10 MG tablet   Oral   Take 10 mg by mouth at bedtime.         Marland Kitchen FLUoxetine (PROZAC) 10 MG capsule   Oral   Take 10 mg by mouth daily.         Marland Kitchen HYDROcodone-acetaminophen (NORCO/VICODIN) 5-325 MG per tablet   Oral   Take 1 tablet by mouth every 4 (four) hours as needed for moderate pain.         . magnesium hydroxide (MILK OF MAGNESIA) 800 MG/5ML suspension   Oral   Take 30 mLs by mouth every 12 (twelve) hours as needed for constipation.         . memantine (NAMENDA) 10 MG tablet   Oral   Take 10 mg by mouth daily.         . NON FORMULARY   Nasal   Place 200 Int'l Units into the  nose daily. Calcitonin         . pantoprazole (PROTONIX) 40 MG tablet   Oral   Take 40 mg by mouth daily.         Marland Kitchen warfarin (COUMADIN) 7.5 MG tablet   Oral   Take 7.5 mg by mouth daily.           Allergies Penicillins  No family history on file.  Social History History  Substance Use Topics  . Smoking status: Not on file  . Smokeless tobacco: Not on file  . Alcohol Use: Not on file    Review of Systems  Constitutional: Negative for fever. Eyes: Negative for visual changes. ENT: Negative for sore throat. Cardiovascular: Negative for chest pain. Respiratory: Negative for shortness of breath. Gastrointestinal: Negative for abdominal pain, vomiting and diarrhea. Genitourinary: Negative for dysuria. Musculoskeletal: Negative for back pain. Positive for right hip pain Skin: Negative for rash. Neurological: Negative for headaches, focal weakness or numbness.   10-point ROS otherwise negative.  ____________________________________________   PHYSICAL EXAM:  VITAL SIGNS:  ED Triage Vitals  Enc Vitals Group     BP --      Pulse --      Resp --      Temp --      Temp src --      SpO2 --      Weight --      Height --      Head Cir --      Peak Flow --      Pain Score --      Pain Loc --      Pain Edu? --      Excl. in [place]? --      Constitutional: Alert and oriented. Well appearing and in no distress. Eyes: Conjunctivae are normal. PERRL. Normal extraocular movements. ENT   Head: Normocephalic and atraumatic.   Nose: No congestion/rhinnorhea.   Mouth/Throat: Mucous membranes are moist.   Neck: No stridor. Hematological/Lymphatic/Immunilogical: No cervical lymphadenopathy. Cardiovascular: Normal rate, regular rhythm. Normal and symmetric distal pulses are present in all extremities. No murmurs, rubs, or gallops. Respiratory: Normal respiratory effort without tachypnea nor retractions. Breath sounds are clear and equal bilaterally. No  wheezes/rales/rhonchi. Gastrointestinal: Soft and nontender. No distention. No abdominal bruits. There is no CVA tenderness. Genitourinary: Deferred Musculoskeletal: Pain with range of motion of right leg primarily in the hip. No pain with axial pressure. Right hip pain with hip flexion.. No joint effusions.  No lower extremity tenderness nor edema. Neurologic:  Normal speech and language. No gross focal neurologic deficits are appreciated. Speech is normal. Gait not tested Skin:  Skin is warm, dry and intact. No rash noted. Psychiatric: Mood and affect are normal. Speech and behavior are normal. Patient exhibits appropriate insight and judgment.  ____________________________________________    LABS (pertinent positives/negatives)    ____________________________________________   EKG   Date: 01/20/2015  Rate: 70  Rhythm: normal sinus rhythm  QRS Axis: normal  Intervals: normal  ST/T Wave abnormalities: normal  Conduction Disutrbances: none  Narrative Interpretation: unremarkable      ____________________________________________    RADIOLOGY   x-ray of the hip shows no acute fracture   ____________________________________________   PROCEDURES  Procedure(s) performed: None  Critical Care performed: No  ____________________________________________   INITIAL IMPRESSION / ASSESSMENT AND PLAN / ED COURSE  Pertinent labs & imaging results that were available during my care of the patient were reviewed by me and considered in my medical decision making (see chart for details). Given tenderness and right hip concerning after a fall. We'll obtain x-rays and labs given that they fall was unwitnessed. Patient is also currently in hospice will attempt to contact hospice nurse.  ----------------------------------------- 8:58 AM on 01/20/2015 -----------------------------------------  Workup unremarkable, no evidence of head trauma, x-rays negative. Feel discharge is  appropriate  ____________________________________________   FINAL CLINICAL IMPRESSION(S) / ED DIAGNOSES  Final diagnoses:  Pain in hip joint, right  Fall at nursing home, initial encounter     Lavonia Drafts, MD 01/20/15 1533

## 2015-05-03 ENCOUNTER — Emergency Department
Admission: EM | Admit: 2015-05-03 | Discharge: 2015-05-03 | Disposition: A | Discharge status: Home / Self Care | Payer: Medicare Other | Attending: Emergency Medicine | Admitting: Emergency Medicine

## 2015-05-03 ENCOUNTER — Emergency Department: Payer: Medicare Other

## 2015-05-03 DIAGNOSIS — W19XXXA Unspecified fall, initial encounter: Secondary | ICD-10-CM

## 2015-05-03 DIAGNOSIS — E119 Type 2 diabetes mellitus without complications: Secondary | ICD-10-CM | POA: Diagnosis not present

## 2015-05-03 DIAGNOSIS — Z79899 Other long term (current) drug therapy: Secondary | ICD-10-CM | POA: Insufficient documentation

## 2015-05-03 DIAGNOSIS — Z87891 Personal history of nicotine dependence: Secondary | ICD-10-CM | POA: Diagnosis not present

## 2015-05-03 DIAGNOSIS — Z880 Allergy status to penicillin: Secondary | ICD-10-CM | POA: Diagnosis not present

## 2015-05-03 DIAGNOSIS — F0390 Unspecified dementia without behavioral disturbance: Secondary | ICD-10-CM | POA: Insufficient documentation

## 2015-05-03 DIAGNOSIS — Y9389 Activity, other specified: Secondary | ICD-10-CM | POA: Insufficient documentation

## 2015-05-03 DIAGNOSIS — Y998 Other external cause status: Secondary | ICD-10-CM | POA: Insufficient documentation

## 2015-05-03 DIAGNOSIS — W01198A Fall on same level from slipping, tripping and stumbling with subsequent striking against other object, initial encounter: Secondary | ICD-10-CM | POA: Insufficient documentation

## 2015-05-03 DIAGNOSIS — S3992XA Unspecified injury of lower back, initial encounter: Secondary | ICD-10-CM | POA: Diagnosis not present

## 2015-05-03 DIAGNOSIS — S199XXA Unspecified injury of neck, initial encounter: Secondary | ICD-10-CM | POA: Insufficient documentation

## 2015-05-03 DIAGNOSIS — I10 Essential (primary) hypertension: Secondary | ICD-10-CM | POA: Insufficient documentation

## 2015-05-03 DIAGNOSIS — Z7901 Long term (current) use of anticoagulants: Secondary | ICD-10-CM | POA: Diagnosis not present

## 2015-05-03 DIAGNOSIS — Y9289 Other specified places as the place of occurrence of the external cause: Secondary | ICD-10-CM | POA: Diagnosis not present

## 2015-05-03 NOTE — Discharge Instructions (Signed)
Please ambulate with your walker at all times. Return to the ER for worsening symptoms, persistent vomiting, lethargy or other concerns.  Fall Prevention and Home Safety Falls cause injuries and can affect all age groups. It is possible to use preventive measures to significantly decrease the likelihood of falls. There are many simple measures which can make your home safer and prevent falls. OUTDOORS  Repair cracks and edges of walkways and driveways.  Remove high doorway thresholds.  Trim shrubbery on the main path into your home.  Have good outside lighting.  Clear walkways of tools, rocks, debris, and clutter.  Check that handrails are not broken and are securely fastened. Both sides of steps should have handrails.  Have leaves, snow, and ice cleared regularly.  Use sand or salt on walkways during winter months.  In the garage, clean up grease or oil spills. BATHROOM  Install night lights.  Install grab bars by the toilet and in the tub and shower.  Use non-skid mats or decals in the tub or shower.  Place a plastic non-slip stool in the shower to sit on, if needed.  Keep floors dry and clean up all water on the floor immediately.  Remove soap buildup in the tub or shower on a regular basis.  Secure bath mats with non-slip, double-sided rug tape.  Remove throw rugs and tripping hazards from the floors. BEDROOMS  Install night lights.  Make sure a bedside light is easy to reach.  Do not use oversized bedding.  Keep a telephone by your bedside.  Have a firm chair with side arms to use for getting dressed.  Remove throw rugs and tripping hazards from the floor. KITCHEN  Keep handles on pots and pans turned toward the center of the stove. Use back burners when possible.  Clean up spills quickly and allow time for drying.  Avoid walking on wet floors.  Avoid hot utensils and knives.  Position shelves so they are not too high or low.  Place commonly used  objects within easy reach.  If necessary, use a sturdy step stool with a grab bar when reaching.  Keep electrical cables out of the way.  Do not use floor polish or wax that makes floors slippery. If you must use wax, use non-skid floor wax.  Remove throw rugs and tripping hazards from the floor. STAIRWAYS  Never leave objects on stairs.  Place handrails on both sides of stairways and use them. Fix any loose handrails. Make sure handrails on both sides of the stairways are as long as the stairs.  Check carpeting to make sure it is firmly attached along stairs. Make repairs to worn or loose carpet promptly.  Avoid placing throw rugs at the top or bottom of stairways, or properly secure the rug with carpet tape to prevent slippage. Get rid of throw rugs, if possible.  Have an electrician put in a light switch at the top and bottom of the stairs. OTHER FALL PREVENTION TIPS  Wear low-heel or rubber-soled shoes that are supportive and fit well. Wear closed toe shoes.  When using a stepladder, make sure it is fully opened and both spreaders are firmly locked. Do not climb a closed stepladder.  Add color or contrast paint or tape to grab bars and handrails in your home. Place contrasting color strips on first and last steps.  Learn and use mobility aids as needed. Install an electrical emergency response system.  Turn on lights to avoid dark areas. Replace light bulbs  that burn out immediately. Get light switches that glow.  Arrange furniture to create clear pathways. Keep furniture in the same place.  Firmly attach carpet with non-skid or double-sided tape.  Eliminate uneven floor surfaces.  Select a carpet pattern that does not visually hide the edge of steps.  Be aware of all pets. OTHER HOME SAFETY TIPS  Set the water temperature for 120 F (48.8 C).  Keep emergency numbers on or near the telephone.  Keep smoke detectors on every level of the home and near sleeping  areas. Document Released: 08/24/2002 Document Revised: 03/04/2012 Document Reviewed: 11/23/2011 [facility] Patient Information 2015 [place], [place]. This information is not intended to replace advice given to you by your health care provider. Make sure you discuss any questions you have with your health care provider.

## 2015-05-03 NOTE — ED Provider Notes (Signed)
[facility] Emergency Department Provider Note  ____________________________________________  Time seen: Approximately 12:29 AM  I have reviewed the triage vital signs and the nursing notes.   HISTORY  Chief Complaint Fall  History limited by dementia History provided by daughter  HPI Jorge Moore is a 81 y.o. male who presents to the ED via EMS from [place] s/p witnessed fall.Patient was found leaning against the wall complaining of neck and back pain. He was ambulatory to the ambulance. Upon arrival patient denies complaints. Denies LOC. Taking baby aspirin only; daughter states he is no longer on warfarin.   Past Medical History  Diagnosis Date  . Depressed   . Dementia   . Hypertension   . GERD (gastroesophageal reflux disease)   . Diabetes mellitus without complication   . Prostate cancer     There are no active problems to display for this patient.   No past surgical history on file.  Current Outpatient Rx  Name  Route  Sig  Dispense  Refill  . acetaminophen (TYLENOL) 325 MG tablet   Oral   Take 650 mg by mouth every 4 (four) hours as needed.         . Calcium Carbonate-Vitamin D (CALCIUM-VITAMIN D) 500-200 MG-UNIT per tablet   Oral   Take 1 tablet by mouth 2 (two) times daily with a meal.         . docusate sodium (COLACE) 100 MG capsule   Oral   Take 100 mg by mouth 2 (two) times daily.         Marland Kitchen donepezil (ARICEPT) 10 MG tablet   Oral   Take 10 mg by mouth at bedtime.         Marland Kitchen FLUoxetine (PROZAC) 10 MG capsule   Oral   Take 10 mg by mouth daily.         Marland Kitchen HYDROcodone-acetaminophen (NORCO/VICODIN) 5-325 MG per tablet   Oral   Take 1 tablet by mouth every 4 (four) hours as needed for moderate pain.         . magnesium hydroxide (MILK OF MAGNESIA) 800 MG/5ML suspension   Oral   Take 30 mLs by mouth every 12 (twelve) hours as needed for constipation.         . memantine (NAMENDA) 10 MG tablet  Oral   Take 10 mg by mouth daily.         . NON FORMULARY   Nasal   Place 200 Int'l Units into the nose daily. Calcitonin         . pantoprazole (PROTONIX) 40 MG tablet   Oral   Take 40 mg by mouth daily.         Marland Kitchen warfarin (COUMADIN) 7.5 MG tablet   Oral   Take 7.5 mg by mouth daily.           Allergies Penicillins  No family history on file.  Social History Social History  Substance Use Topics  . Smoking status: Former Research scientist (life sciences)  . Smokeless tobacco: Not on file  . Alcohol Use: No    Review of Systems Constitutional: No fever/chills Eyes: No visual changes. ENT: No sore throat. Cardiovascular: Denies chest pain. Respiratory: Denies shortness of breath. Gastrointestinal: No abdominal pain.  No nausea, no vomiting.  No diarrhea.  No constipation. Genitourinary: Negative for dysuria. Musculoskeletal: Negative for back pain. Skin: Negative for rash. Neurological: Negative for headaches, focal weakness or numbness.  10-point ROS otherwise negative.  ____________________________________________  PHYSICAL EXAM:  VITAL SIGNS: ED Triage Vitals  Enc Vitals Group     BP --      Pulse --      Resp --      Temp --      Temp src --      SpO2 --      Weight --      Height --      Head Cir --      Peak Flow --      Pain Score --      Pain Loc --      Pain Edu? --      Excl. in [place]? --     Constitutional: Alert and oriented. Well appearing and in no acute distress. Eyes: Conjunctivae are normal. PERRL. EOMI. Head: Atraumatic. Nose: No congestion/rhinnorhea. Mouth/Throat: Mucous membranes are moist.  Oropharynx non-erythematous. Neck: No stridor. No cervical spine tenderness to palpation. There is no step-off or deformity. Cardiovascular: Normal rate, regular rhythm. Grossly normal heart sounds.  Good peripheral circulation. Respiratory: Normal respiratory effort.  No retractions. Lungs CTAB. Gastrointestinal: Soft and nontender. No distention. No  abdominal bruits. No CVA tenderness. Musculoskeletal: Pelvis is stable. Hips are nontender with full range of motion bilaterally. No lower extremity tenderness nor edema.  No joint effusions. Neurologic:  Normal speech and language. No gross focal neurologic deficits are appreciated. Baseline mental status per daughter. Skin:  Skin is warm, dry and intact. No rash noted. Psychiatric: Mood and affect are normal. Speech and behavior are normal.  ____________________________________________   LABS (all labs ordered are listed, but only abnormal results are displayed)  Labs Reviewed - No data to display ____________________________________________  EKG  ED ECG REPORT I, Denajah Farias J, the attending physician, personally viewed and interpreted this ECG.   Date: 05/03/2015  EKG Time: 0141  Rate: 57  Rhythm: sinus bradycardia  Axis: Normal  Intervals:none  ST&T Change: Nonspecific  ____________________________________________  RADIOLOGY  CT head and cervical spine without contrast interpreted per Dr. Dorann Lodge: CT HEAD: No acute intracranial process.  Severe global brain atrophy with a component of suspected normal pressure hydrocephalus. Severe chronic small vessel ischemic disease. 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  Soft tissue within the RIGHT external auditory canal with erosion, though this could represent prior malignant otitis externus, neoplasm not excluded, recommend direct inspection.  CT CERVICAL SPINE: Broad reversed cervical lordosis without acute fracture or malalignment.  Multilevel severe neural foraminal narrowing. ____________________________________________   PROCEDURES  Procedure(s) performed: None  Critical Care performed: No  ____________________________________________   INITIAL IMPRESSION / ASSESSMENT AND PLAN / ED COURSE  Pertinent labs & imaging results that were available during my care of the patient were  reviewed by me and considered in my medical decision making (see chart for details).  81 year old male s/p unwitnessed fall at the nursing facility, initially complaining of neck and back pain. Discussed with daughter; given patient's dementia, will obtain CT head and cervical spine, and EKG.  ----------------------------------------- 1:59 AM on 05/03/2015 -----------------------------------------  Patient resting in no acute distress. Updated daughter of imaging results and EKG. Directly inspected patient's right external auditory canal and there is no mass or nodule visualized. Daughter denies him having worsening dementia or urinary incontinence, and states he ambulates steadily with a walker. Strict return precautions given. Daughter verbalizes understanding and agrees with plan of care. ____________________________________________   FINAL CLINICAL IMPRESSION(S) / ED DIAGNOSES  Final diagnoses:  Fall, initial encounter  Paulette Blanch, MD 05/03/15 [phone]

## 2015-05-03 NOTE — ED Notes (Signed)
Pt is altered at baseline. Pt fell and was found leaning against the wall c/o of neck and back pain

## 2015-05-25 DIAGNOSIS — R930 Abnormal findings on diagnostic imaging of skull and head, not elsewhere classified: Secondary | ICD-10-CM | POA: Insufficient documentation

## 2015-06-20 ENCOUNTER — Encounter: Payer: Self-pay | Admitting: Emergency Medicine

## 2015-06-20 ENCOUNTER — Emergency Department: Payer: Medicare Other

## 2015-06-20 ENCOUNTER — Emergency Department
Admission: EM | Admit: 2015-06-20 | Discharge: 2015-06-20 | Disposition: A | Discharge status: Home / Self Care | Payer: Medicare Other | Attending: Student | Admitting: Student

## 2015-06-20 DIAGNOSIS — S8990XA Unspecified injury of unspecified lower leg, initial encounter: Secondary | ICD-10-CM | POA: Insufficient documentation

## 2015-06-20 DIAGNOSIS — W010XXA Fall on same level from slipping, tripping and stumbling without subsequent striking against object, initial encounter: Secondary | ICD-10-CM | POA: Diagnosis not present

## 2015-06-20 DIAGNOSIS — Y92002 Bathroom of unspecified non-institutional (private) residence single-family (private) house as the place of occurrence of the external cause: Secondary | ICD-10-CM | POA: Diagnosis not present

## 2015-06-20 DIAGNOSIS — Z7901 Long term (current) use of anticoagulants: Secondary | ICD-10-CM | POA: Diagnosis not present

## 2015-06-20 DIAGNOSIS — Z880 Allergy status to penicillin: Secondary | ICD-10-CM | POA: Insufficient documentation

## 2015-06-20 DIAGNOSIS — Y998 Other external cause status: Secondary | ICD-10-CM | POA: Insufficient documentation

## 2015-06-20 DIAGNOSIS — I10 Essential (primary) hypertension: Secondary | ICD-10-CM | POA: Insufficient documentation

## 2015-06-20 DIAGNOSIS — Z79899 Other long term (current) drug therapy: Secondary | ICD-10-CM | POA: Diagnosis not present

## 2015-06-20 DIAGNOSIS — Z87891 Personal history of nicotine dependence: Secondary | ICD-10-CM | POA: Insufficient documentation

## 2015-06-20 DIAGNOSIS — Y9389 Activity, other specified: Secondary | ICD-10-CM | POA: Diagnosis not present

## 2015-06-20 DIAGNOSIS — E119 Type 2 diabetes mellitus without complications: Secondary | ICD-10-CM | POA: Insufficient documentation

## 2015-06-20 DIAGNOSIS — W19XXXA Unspecified fall, initial encounter: Secondary | ICD-10-CM

## 2015-06-20 NOTE — ED Notes (Signed)
Pt from [facility] ems was called for possible fall. Per staff pt was "found" on the bathroom floor and was unable to tell if pt was assisted to restroom or not. Put is new to staff so wasn't able to give much information to ems on pts hx.

## 2015-06-20 NOTE — ED Provider Notes (Signed)
[facility] Emergency Department Provider Note  ____________________________________________  Time seen: Approximately 9:02 PM  I have reviewed the triage vital signs and the nursing notes.   HISTORY  Chief Complaint Fall  Caveat-history of present illness and review of systems Limited secondary to the patient's chronic dementia. All information obtained from staff at the [facility].  HPI Jorge Moore is a 81 y.o. male with history of dementia, hypertension, GERD, diabetes, history of atrial fibrillation and pulmonary embolism but not currently anticoagulated who presents from the [facility] for presumed mechanical fall which occurred suddenly just prior to the patient's arrival. According to staff at the [facility], the patient walked himself to the bathroom with his walker. Staff heard him call out soon after that and they found him laying on the floor in his bathroom. He was awake and alert but was complaining of some pain in his leg.  they are unsure whether or not he hit his head. He has not had any recent illness including no cough, vomiting, diarrhea, fevers or chills.   Past Medical History  Diagnosis Date  . Depressed   . Dementia   . Hypertension   . GERD (gastroesophageal reflux disease)   . Diabetes mellitus without complication ([place])   . Prostate cancer ([place])     There are no active problems to display for this patient.   History reviewed. No pertinent past surgical history.  Current Outpatient Rx  Name  Route  Sig  Dispense  Refill  . acetaminophen (TYLENOL) 325 MG tablet   Oral   Take 650 mg by mouth every 4 (four) hours as needed.         . Calcium Carbonate-Vitamin D (CALCIUM-VITAMIN D) 500-200 MG-UNIT per tablet   Oral   Take 1 tablet by mouth 2 (two) times daily with a meal.         . docusate sodium (COLACE) 100 MG capsule   Oral   Take 100 mg by mouth 2 (two) times daily.         Marland Kitchen donepezil (ARICEPT) 10 MG tablet   Oral   Take 10  mg by mouth at bedtime.         Marland Kitchen FLUoxetine (PROZAC) 10 MG capsule   Oral   Take 10 mg by mouth daily.         Marland Kitchen HYDROcodone-acetaminophen (NORCO/VICODIN) 5-325 MG per tablet   Oral   Take 1 tablet by mouth every 4 (four) hours as needed for moderate pain.         . magnesium hydroxide (MILK OF MAGNESIA) 800 MG/5ML suspension   Oral   Take 30 mLs by mouth every 12 (twelve) hours as needed for constipation.         . memantine (NAMENDA) 10 MG tablet   Oral   Take 10 mg by mouth daily.         . NON FORMULARY   Nasal   Place 200 Int'l Units into the nose daily. Calcitonin         . pantoprazole (PROTONIX) 40 MG tablet   Oral   Take 40 mg by mouth daily.         Marland Kitchen warfarin (COUMADIN) 7.5 MG tablet   Oral   Take 7.5 mg by mouth daily.           Allergies Penicillins  History reviewed. No pertinent family history.  Social History Social History  Substance Use Topics  . Smoking status: Former Research scientist (life sciences)  .  Smokeless tobacco: None  . Alcohol Use: No    Review of Systems   Caveat-history of present illness and review of systems Limited secondary to the patient's chronic dementia. All information obtained from staff at the [facility]. ____________________________________________   PHYSICAL EXAM:  Filed Vitals:   06/20/15 2103  BP: 121/58  Pulse: 66  Temp: 98.4 F (36.9 C)  TempSrc: Oral  Resp: 13  Height: 5\' 9"  (1.753 m)  Weight: 150 lb 6 oz (68.21 kg)  SpO2: 99%   Constitutional: Alert and oriented to self only. Pleasant. Well appearing and in no acute distress. Eyes: Conjunctivae are normal. PERRL. EOMI. Head: Atraumatic. Nose: No congestion/rhinnorhea. Mouth/Throat: Mucous membranes are moist.  Oropharynx non-erythematous. Neck: No stridor. No midline cervical spine tenderness to palpation. Cardiovascular: Normal rate, regular rhythm. Grossly normal heart sounds.  Good peripheral circulation. Respiratory: Normal respiratory effort.  No  retractions. Lungs CTAB. Gastrointestinal: Soft and nontender. No distention. No abdominal bruits. No CVA tenderness. Genitourinary: deferred Musculoskeletal: No lower extremity tenderness nor edema, nor deformity.  No joint effusions. Pelvis is stable to rock and compression.patient has full passive range of motion of bilateral hips. He has no midline tenderness to palpation throughout the T or L-spine.  Neurologic:  Normal speech and language. No gross focal neurologic deficits are appreciated.  Skin:  Skin is warm, dry and intact. No rash noted. Psychiatric: Mood and affect are normal. Speech and behavior are normal.  ____________________________________________   LABS (all labs ordered are listed, but only abnormal results are displayed)  Labs Reviewed - No data to display ____________________________________________  EKG  None ____________________________________________  RADIOLOGY  CT head and C-spine   IMPRESSION: No acute intracranial pathology. Extensive chronic ischemic changes are stable.  No evidence of cervical spine injury. Chronic changes are stable. ____________________________________________   PROCEDURES  Procedure(s) performed: None  Critical Care performed: No  ____________________________________________   INITIAL IMPRESSION / ASSESSMENT AND PLAN / ED COURSE  Pertinent labs & imaging results that were available during my care of the patient were reviewed by me and considered in my medical decision making (see chart for details).  Jorge Moore is a 81 y.o. male with history of dementia, hypertension, GERD, diabetes, history of atrial fibrillation and pulmonary embolism but not currently anticoagulated who presents from the [facility] for fall which occurred suddenly just prior to the patient's arrival. On exam, he is generally well-appearing and in no acute distress. Vital signs stable, he is afebrile. His exam is unremarkable and he has no pain  complaints at this time. Plan for screening CT head, C-spine given possible head injury this evening, advance age. If unremarkable, anticipate discharge back to the [facility] with PCP follow-up as needed.   ----------------------------------------- 10:27 PM on 06/20/2015 -----------------------------------------  Imaging negative. Patient continues to feel well with no pain complaints. I discussed his care with his daughter who is at the bedside reports that this happens to him often, she also reports that frequently he will lean against a wall or slide down a wall if he is walking and develops some kind of pain or discomfort and this may have happened today. She feels he is at his baseline terms of mental status and is comfortable taking him back to the [facility]. We'll discharge with return precautions and PCP follow-up. ____________________________________________   FINAL CLINICAL IMPRESSION(S) / ED DIAGNOSES  Final diagnoses:  Fall, initial encounter      Joanne Gavel, MD 06/20/15 2228

## 2015-06-25 ENCOUNTER — Emergency Department: Payer: Medicare Other

## 2015-06-25 ENCOUNTER — Emergency Department
Admission: EM | Admit: 2015-06-25 | Discharge: 2015-06-25 | Disposition: A | Discharge status: Home / Self Care | Payer: Medicare Other | Attending: Emergency Medicine | Admitting: Emergency Medicine

## 2015-06-25 ENCOUNTER — Encounter: Payer: Self-pay | Admitting: Emergency Medicine

## 2015-06-25 DIAGNOSIS — Z87891 Personal history of nicotine dependence: Secondary | ICD-10-CM | POA: Diagnosis not present

## 2015-06-25 DIAGNOSIS — Y9389 Activity, other specified: Secondary | ICD-10-CM | POA: Diagnosis not present

## 2015-06-25 DIAGNOSIS — Z79899 Other long term (current) drug therapy: Secondary | ICD-10-CM | POA: Insufficient documentation

## 2015-06-25 DIAGNOSIS — E119 Type 2 diabetes mellitus without complications: Secondary | ICD-10-CM | POA: Insufficient documentation

## 2015-06-25 DIAGNOSIS — Z7901 Long term (current) use of anticoagulants: Secondary | ICD-10-CM | POA: Insufficient documentation

## 2015-06-25 DIAGNOSIS — W19XXXA Unspecified fall, initial encounter: Secondary | ICD-10-CM | POA: Diagnosis not present

## 2015-06-25 DIAGNOSIS — Y998 Other external cause status: Secondary | ICD-10-CM | POA: Diagnosis not present

## 2015-06-25 DIAGNOSIS — Y92121 Bathroom in nursing home as the place of occurrence of the external cause: Secondary | ICD-10-CM | POA: Diagnosis not present

## 2015-06-25 DIAGNOSIS — S0990XA Unspecified injury of head, initial encounter: Secondary | ICD-10-CM | POA: Diagnosis present

## 2015-06-25 DIAGNOSIS — Z880 Allergy status to penicillin: Secondary | ICD-10-CM | POA: Insufficient documentation

## 2015-06-25 DIAGNOSIS — I10 Essential (primary) hypertension: Secondary | ICD-10-CM | POA: Diagnosis not present

## 2015-06-25 HISTORY — DX: Other pulmonary embolism without acute cor pulmonale: I26.99

## 2015-06-25 HISTORY — DX: Unspecified atrial fibrillation: I48.91

## 2015-06-25 HISTORY — DX: Ataxia, unspecified: R27.0

## 2015-06-25 NOTE — Discharge Instructions (Signed)
Fall Prevention in Hospitals, Adult As a hospital patient, your condition and the treatments you receive can increase your risk for falls. Some additional risk factors for falls in a hospital include:  Being in an unfamiliar environment.  Being on bed rest.  Your surgery.  Taking certain medicines.  Your tubing requirements, such as intravenous (IV) therapy or catheters. It is important that you learn how to decrease fall risks while at the hospital. Below are important tips that can help prevent falls. SAFETY TIPS FOR PREVENTING FALLS Talk about your risk of falling.  Ask your health care provider why you are at risk for falling. Is it your medicine, illness, tubing placement, or something else?  Make a plan with your health care provider to keep you safe from falls.  Ask your health care provider or pharmacist about side effects of your medicines. Some medicines can make you dizzy or affect your coordination. Ask for help.  Ask for help before getting out of bed. You may need to press your call button.  Ask for assistance in getting safely to the toilet.  Ask for a walker or cane to be put at your bedside. Ask that most of the side rails on your bed be placed up before your health care provider leaves the room.  Ask family or friends to sit with you.  Ask for things that are out of your reach, such as your glasses, hearing aids, telephone, bedside table, or call button. Follow these tips to avoid falling:  Stay lying or seated, rather than standing, while waiting for help.  Wear rubber-soled slippers or shoes whenever you walk in the hospital.  Avoid quick, sudden movements.  Change positions slowly.  Sit on the side of your bed before standing.  Stand up slowly and wait before you start to walk.  Let your health care provider know if there is a spill on the floor.  Pay careful attention to the medical equipment, electrical cords, and tubes around you.  When you  need help, use your call button by your bed or in the bathroom. Wait for one of your health care providers to help you.  If you feel dizzy or unsure of your footing, return to bed and wait for assistance.  Avoid being distracted by the TV, telephone, or another person in your room.  Do not lean or support yourself on rolling objects, such as IV poles or bedside tables.   This information is not intended to replace advice given to you by your health care provider. Make sure you discuss any questions you have with your health care provider.   Document Released: 08/31/2000 Document Revised: 09/24/2014 Document Reviewed: 05/11/2012 Elsevier Interactive Patient Education [employer]. Please return as needed

## 2015-06-25 NOTE — ED Notes (Signed)
BIB EMS from the [facility] EMS reports pt lost his balance and fell hit the back of head on a door. No laceration or hematoma noted at this time. Pt also states he hurts all over.

## 2015-06-25 NOTE — ED Provider Notes (Signed)
[facility] Emergency Department Provider Note  ____________________________________________  Time seen: Approximately 8:16 AM  I have reviewed the triage vital signs and the nursing notes.   HISTORY  Chief Complaint Fall  History limited by dementia  HPI Jorge Moore is a 81 y.o. male patient found on floor in bathroom and nursing home. Very difficult to understand with patient saying he mumbles a lot he does seem to indicate that he has some pain in his back are not sure exactly where that is the exam seems to change every time I palpate him and he mumbles more there is no pain on palpation on repeated exams in the arms hips or legs however  Past Medical History  Diagnosis Date  . Depressed   . Dementia   . Hypertension   . GERD (gastroesophageal reflux disease)   . Diabetes mellitus without complication ([place])   . Prostate cancer ([place])   . Atrial fibrillation ([place])   . Ataxia   . PE (pulmonary embolism)     There are no active problems to display for this patient.   No past surgical history on file.  Current Outpatient Rx  Name  Route  Sig  Dispense  Refill  . acetaminophen (TYLENOL) 325 MG tablet   Oral   Take 650 mg by mouth every 4 (four) hours as needed.         . Calcium Carbonate-Vitamin D (CALCIUM-VITAMIN D) 500-200 MG-UNIT per tablet   Oral   Take 1 tablet by mouth 2 (two) times daily with a meal.         . docusate sodium (COLACE) 100 MG capsule   Oral   Take 100 mg by mouth 2 (two) times daily.         Marland Kitchen donepezil (ARICEPT) 10 MG tablet   Oral   Take 10 mg by mouth at bedtime.         Marland Kitchen FLUoxetine (PROZAC) 10 MG capsule   Oral   Take 10 mg by mouth daily.         Marland Kitchen HYDROcodone-acetaminophen (NORCO/VICODIN) 5-325 MG per tablet   Oral   Take 1 tablet by mouth every 4 (four) hours as needed for moderate pain.         . magnesium hydroxide (MILK OF MAGNESIA) 800 MG/5ML suspension   Oral   Take 30 mLs by  mouth every 12 (twelve) hours as needed for constipation.         . memantine (NAMENDA) 10 MG tablet   Oral   Take 10 mg by mouth daily.         . NON FORMULARY   Nasal   Place 200 Int'l Units into the nose daily. Calcitonin         . pantoprazole (PROTONIX) 40 MG tablet   Oral   Take 40 mg by mouth daily.         Marland Kitchen warfarin (COUMADIN) 7.5 MG tablet   Oral   Take 7.5 mg by mouth daily.           Allergies Penicillins  No family history on file.  Social History Social History  Substance Use Topics  . Smoking status: Former Research scientist (life sciences)  . Smokeless tobacco: None  . Alcohol Use: No    Review of Systems Unable to obtain due to dementia and mumbling  ____________________________________________   PHYSICAL EXAM:  VITAL SIGNS: ED Triage Vitals  Enc Vitals Group     BP 06/25/15 0802 119/68  mmHg     Pulse Rate 06/25/15 0802 65     Resp 06/25/15 0802 18     Temp 06/25/15 0802 97.9 F (36.6 C)     Temp Source 06/25/15 0802 Oral     SpO2 06/25/15 0802 94 %     Weight 06/25/15 0802 162 lb 0.6 oz (73.5 kg)     Height 06/25/15 0802 5\' 8"  (1.727 m)     Head Cir --      Peak Flow --      Pain Score 06/25/15 0803 5     Pain Loc --      Pain Edu? --      Excl. in [place]? --    Constitutional: Alert  Well appearing and in no acute distress. Eyes: Conjunctivae are normal. PERRL. EOMI. Head: Atraumatic. Nose: No congestion/rhinnorhea. Mouth/Throat: Mucous membranes are moist.  Oropharynx non-erythematous. Neck: No stridor.  No apparent cervical spine tenderness to palpation Cardiovascular: Normal rate, regular rhythm. Grossly normal heart sounds.  Good peripheral circulation. Respiratory: Normal respiratory effort.  No retractions. Lungs CTAB. Gastrointestinal: Soft and nontender. No distention. No abdominal bruits. No CVA tenderness. Musculoskeletal: No lower extremity tenderness nor edema.  No joint effusions. Neurologic:  No gross focal neurologic deficits are  appreciated.  Skin:  Skin is warm, dry and intact. No rash noted.  ____________________________________________   LABS (all labs ordered are listed, but only abnormal results are displayed)  Labs Reviewed - No data to display ____________________________________________  EKG   ____________________________________________  RADIOLOGY CT of the head and neck read by radiology as no acute disease Chest x-ray shows no acute disease there is a stable old T12 compression fracture per radiology Lumbar spine showed no acute fractures that show the old T12 compression fracture  ____________________________________________   PROCEDURES    ____________________________________________   INITIAL IMPRESSION / ASSESSMENT AND PLAN / ED COURSE  Pertinent labs & imaging results that were available during my care of the patient were reviewed by me and considered in my medical decision making (see chart for details).   ____________________________________________   FINAL CLINICAL IMPRESSION(S) / ED DIAGNOSES  Final diagnoses:  Fall, initial encounter      Nena Polio, MD 06/25/15 [phone]

## 2015-07-22 DIAGNOSIS — G9389 Other specified disorders of brain: Secondary | ICD-10-CM | POA: Insufficient documentation

## 2015-09-19 ENCOUNTER — Emergency Department: Payer: Medicare Other

## 2015-09-19 ENCOUNTER — Emergency Department
Admission: EM | Admit: 2015-09-19 | Discharge: 2015-09-19 | Disposition: A | Discharge status: Home / Self Care | Payer: Medicare Other | Attending: Emergency Medicine | Admitting: Emergency Medicine

## 2015-09-19 ENCOUNTER — Encounter: Payer: Self-pay | Admitting: Emergency Medicine

## 2015-09-19 DIAGNOSIS — Z79899 Other long term (current) drug therapy: Secondary | ICD-10-CM | POA: Diagnosis not present

## 2015-09-19 DIAGNOSIS — M542 Cervicalgia: Secondary | ICD-10-CM | POA: Diagnosis present

## 2015-09-19 DIAGNOSIS — Z880 Allergy status to penicillin: Secondary | ICD-10-CM | POA: Diagnosis not present

## 2015-09-19 DIAGNOSIS — F0390 Unspecified dementia without behavioral disturbance: Secondary | ICD-10-CM | POA: Diagnosis not present

## 2015-09-19 DIAGNOSIS — M5090 Cervical disc disorder, unspecified, unspecified cervical region: Secondary | ICD-10-CM | POA: Diagnosis not present

## 2015-09-19 DIAGNOSIS — Z7901 Long term (current) use of anticoagulants: Secondary | ICD-10-CM | POA: Insufficient documentation

## 2015-09-19 DIAGNOSIS — Z87891 Personal history of nicotine dependence: Secondary | ICD-10-CM | POA: Diagnosis not present

## 2015-09-19 LAB — BASIC METABOLIC PANEL
Anion gap: 3 — ABNORMAL LOW (ref 5–15)
BUN: 15 mg/dL (ref 6–20)
CALCIUM: 9 mg/dL (ref 8.9–10.3)
CO2: 32 mmol/L (ref 22–32)
Chloride: 102 mmol/L (ref 101–111)
Creatinine, Ser: 1.16 mg/dL (ref 0.61–1.24)
GFR calc non Af Amer: 54 mL/min — ABNORMAL LOW (ref >60)
Glucose, Bld: 127 mg/dL — ABNORMAL HIGH (ref 65–99)
Potassium: 3.8 mmol/L (ref 3.5–5.1)
SODIUM: 137 mmol/L (ref 135–145)

## 2015-09-19 LAB — CBC
HCT: 37.2 % — ABNORMAL LOW (ref 40.0–52.0)
Hemoglobin: 12.3 g/dL — ABNORMAL LOW (ref 13.0–18.0)
MCH: 32.3 pg (ref 26.0–34.0)
MCHC: 33.2 g/dL (ref 32.0–36.0)
MCV: 97.2 fL (ref 80.0–100.0)
Platelets: 264 10*3/uL (ref 150–440)
RBC: 3.82 MIL/uL — AB (ref 4.40–5.90)
RDW: 12.3 % (ref 11.5–14.5)
WBC: 11.7 10*3/uL — ABNORMAL HIGH (ref 3.8–10.6)

## 2015-09-19 LAB — PROTIME-INR
INR: 1.2
Prothrombin Time: 15.4 seconds — ABNORMAL HIGH (ref 11.4–15.0)

## 2015-09-19 LAB — GLUCOSE, CAPILLARY: Glucose-Capillary: 109 mg/dL — ABNORMAL HIGH (ref 65–99)

## 2015-09-19 LAB — TROPONIN I: Troponin I: <0.03 ng/mL (ref <0.031)

## 2015-09-19 MED ORDER — TRAMADOL HCL 50 MG PO TABS
50.0000 mg | ORAL_TABLET | Freq: Once | ORAL | Status: AC
  Administered 2015-09-19: 50 mg via ORAL
  Filled 2015-09-19: qty 1

## 2015-09-19 MED ORDER — SODIUM CHLORIDE 0.9 % IV BOLUS (SEPSIS)
500.0000 mL | Freq: Once | INTRAVENOUS | Status: AC
  Administered 2015-09-19: 500 mL via INTRAVENOUS

## 2015-09-19 NOTE — ED Notes (Signed)
Patient transported to CT 

## 2015-09-19 NOTE — ED Provider Notes (Signed)
Time Seen: Approximately 1900  I have reviewed the triage notes  Chief Complaint: Weakness and Hypotension   History of Present Illness: Jorge Moore is a 82 y.o. male who was sent via EMS because of decreased responsiveness and a low blood pressure. Patient's awake and alert and able to answer questions here he does have some baseline dementia. His family states he's been complaining of some right-sided neck pain which is not been assessed by the staff at the nursing facility and is been complaining about the pain for the last 10 days and is been given Ultram. Of any changes in his other medications. There's been no history of trauma. The patient himself denies any headaches nausea, vomiting, chest pain, shortness of breath, abdominal pain, focal weakness or any other new concerns. He has a history of atrial fibrillation and is still on Coumadin.   Past Medical History  Diagnosis Date  . Depressed   . Dementia   . GERD (gastroesophageal reflux disease)   . Prostate cancer ([place])   . Atrial fibrillation ([place])   . Ataxia   . PE (pulmonary embolism)     There are no active problems to display for this patient.   History reviewed. No pertinent past surgical history.  History reviewed. No pertinent past surgical history.  Current Outpatient Rx  Name  Route  Sig  Dispense  Refill  . acetaminophen (TYLENOL) 325 MG tablet   Oral   Take 650 mg by mouth every 4 (four) hours as needed.         . Calcium Carbonate-Vitamin D (CALCIUM-VITAMIN D) 500-200 MG-UNIT per tablet   Oral   Take 1 tablet by mouth 2 (two) times daily with a meal.         . docusate sodium (COLACE) 100 MG capsule   Oral   Take 100 mg by mouth 2 (two) times daily.         Marland Kitchen donepezil (ARICEPT) 10 MG tablet   Oral   Take 10 mg by mouth at bedtime.         Marland Kitchen FLUoxetine (PROZAC) 10 MG capsule   Oral   Take 10 mg by mouth daily.         Marland Kitchen HYDROcodone-acetaminophen (NORCO/VICODIN) 5-325 MG per  tablet   Oral   Take 1 tablet by mouth every 4 (four) hours as needed for moderate pain.         . magnesium hydroxide (MILK OF MAGNESIA) 800 MG/5ML suspension   Oral   Take 30 mLs by mouth every 12 (twelve) hours as needed for constipation.         . memantine (NAMENDA) 10 MG tablet   Oral   Take 10 mg by mouth daily.         . NON FORMULARY   Nasal   Place 200 Int'l Units into the nose daily. Calcitonin         . pantoprazole (PROTONIX) 40 MG tablet   Oral   Take 40 mg by mouth daily.         Marland Kitchen warfarin (COUMADIN) 7.5 MG tablet   Oral   Take 7.5 mg by mouth daily.           Allergies:  Penicillins  Family History: No family history on file.  Social History: Social History  Substance Use Topics  . Smoking status: Former Research scientist (life sciences)  . Smokeless tobacco: None  . Alcohol Use: No     Review of Systems:  10 point review of systems was performed and was otherwise negative:  Constitutional: No fever Eyes: No visual disturbances ENT: No sore throat, ear pain Cardiac: No chest pain Respiratory: No shortness of breath, wheezing, or stridor Abdomen: No abdominal pain, no vomiting, No diarrhea Endocrine: No weight loss, No night sweats Extremities: No peripheral edema, cyanosis Skin: No rashes, easy bruising Neurologic: No focal weakness, trouble with speech or swollowing Urologic: No dysuria, Hematuria, or urinary frequency   Physical Exam:  ED Triage Vitals  Enc Vitals Group     BP 09/19/15 1838 113/51 mmHg     Pulse Rate 09/19/15 1838 71     Resp 09/19/15 1838 18     Temp 09/19/15 1838 97.6 F (36.4 C)     Temp Source 09/19/15 1838 Oral     SpO2 09/19/15 1838 94 %     Weight 09/19/15 1838 160 lb (72.576 kg)     Height 09/19/15 1838 5\' 10"  (1.778 m)     Head Cir --      Peak Flow --      Pain Score --      Pain Loc --      Pain Edu? --      Excl. in [place]? --     General: Awake , Alert , and Oriented times 3; GCS 15 Head: Normal cephalic ,  atraumatic Eyes: Pupils equal , round, reactive to light Nose/Throat: No nasal drainage, patent upper airway without erythema or exudate.  Neck: Supple, Full range of motion, No anterior adenopathy or palpable thyroid masses Lungs: Clear to ascultation without wheezes , rhonchi, or rales Heart: Regular rate, regular rhythm without murmurs , gallops , or rubs Abdomen: Soft, non tender without rebound, guarding , or rigidity; bowel sounds positive and symmetric in all 4 quadrants. No organomegaly .        Extremities: 2 plus symmetric pulses. No edema, clubbing or cyanosis Neurologic: normal ambulation, Motor symmetric without deficits, sensory intact Skin: warm, dry, no rashes   Labs:   All laboratory work was reviewed including any pertinent negatives or positives listed below:  Labs Reviewed  [place] ([place])  TROPONIN I  CBG MONITORING, ED   Laboratory work reviewed showed no significant abnormalities  Radiology:     EXAM: CT CERVICAL SPINE WITHOUT CONTRAST  TECHNIQUE: Multidetector CT imaging of the cervical spine was performed without intravenous contrast. Multiplanar CT image reconstructions were also generated.  COMPARISON: 06/25/2015  FINDINGS: Spinal visualization through the bottom of T3. Prevertebral soft tissues are within normal limits. Similar appearance of reversal of expected cervical lordosis about the C5-6 level. Encephalomalacia in the left cerebellar hemisphere is similar to on the prior and presumably related to prior infarct. This is suboptimally evaluated. Bilateral carotid atherosclerosis. Biapical pleural parenchymal scarring. No apical pneumothorax. Prominent disc bulge at C2-3, similar. Multilevel spondylosis, resulting in areas of central canal and neural foraminal narrowing. Most significant at C5-6 and C6-7. Skull base intact. Maintenance of vertebral body height.  Endplate osteophytes and degenerative disc disease at C5 through T1. Facet arthropathy is worse at C3 through C5. Coronal reformats demonstrate only degenerative change at the C1-2 articulation.  IMPRESSION: 1. Advanced spondylosis, without acute fracture or subluxation. 2. Reversal of expected cervical lordosis, likely degenerative. Prominent C2-3 disc bulge is chronic.   I personally reviewed the radiologic studies     ED Course:  Patient's stay here was uneventful and he was given Ultram here  which seemed to improve his pain and range of motion. His blood pressure was monitored continuously while here in emergency department her was no episodes of hypotension. His white blood cell count is slightly elevated bilirubin may be hemoconcentration the patient's CT showed a disc bulge but did not appear to be anything acute that would require surgical management. The findings were reviewed with the patient but mainly with his family. He'll be transported back to the nursing facility.    Assessment:  Possible hypotension (resolved) Cervical disc disease     Plan:  Outpatient management Patient was advised to return immediately if condition worsens. Patient was advised to follow up with their primary care physician or other specialized physicians involved in their outpatient care             Daymon Larsen, MD 09/19/15 2359

## 2015-09-19 NOTE — ED Notes (Signed)
Pt arrived via EMS from the [facility]. EMS was called due to reports of low blood pressure. EMS reports 107/51. Pt reports back and neck pain. Pt has a history of dementia. Pt responds when spoken to.

## 2015-11-03 ENCOUNTER — Emergency Department: Payer: Medicare Other

## 2015-11-03 ENCOUNTER — Encounter: Payer: Self-pay | Admitting: Radiology

## 2015-11-03 ENCOUNTER — Inpatient Hospital Stay
Admission: EM | Admit: 2015-11-03 | Discharge: 2015-11-07 | DRG: 480 | Disposition: A | Discharge status: Skilled Nursing Facility | Payer: Medicare Other | Attending: Internal Medicine | Admitting: Internal Medicine

## 2015-11-03 DIAGNOSIS — S72001A Fracture of unspecified part of neck of right femur, initial encounter for closed fracture: Secondary | ICD-10-CM

## 2015-11-03 DIAGNOSIS — K219 Gastro-esophageal reflux disease without esophagitis: Secondary | ICD-10-CM | POA: Diagnosis present

## 2015-11-03 DIAGNOSIS — I4891 Unspecified atrial fibrillation: Secondary | ICD-10-CM | POA: Diagnosis present

## 2015-11-03 DIAGNOSIS — F0390 Unspecified dementia without behavioral disturbance: Secondary | ICD-10-CM | POA: Diagnosis present

## 2015-11-03 DIAGNOSIS — R296 Repeated falls: Secondary | ICD-10-CM | POA: Diagnosis present

## 2015-11-03 DIAGNOSIS — S72009A Fracture of unspecified part of neck of unspecified femur, initial encounter for closed fracture: Secondary | ICD-10-CM | POA: Diagnosis present

## 2015-11-03 DIAGNOSIS — H9190 Unspecified hearing loss, unspecified ear: Secondary | ICD-10-CM | POA: Diagnosis present

## 2015-11-03 DIAGNOSIS — W19XXXA Unspecified fall, initial encounter: Secondary | ICD-10-CM | POA: Diagnosis present

## 2015-11-03 DIAGNOSIS — S72141A Displaced intertrochanteric fracture of right femur, initial encounter for closed fracture: Principal | ICD-10-CM | POA: Diagnosis present

## 2015-11-03 DIAGNOSIS — Z86711 Personal history of pulmonary embolism: Secondary | ICD-10-CM

## 2015-11-03 DIAGNOSIS — R270 Ataxia, unspecified: Secondary | ICD-10-CM | POA: Diagnosis present

## 2015-11-03 DIAGNOSIS — J189 Pneumonia, unspecified organism: Secondary | ICD-10-CM | POA: Diagnosis present

## 2015-11-03 DIAGNOSIS — R1310 Dysphagia, unspecified: Secondary | ICD-10-CM | POA: Diagnosis present

## 2015-11-03 DIAGNOSIS — Z7982 Long term (current) use of aspirin: Secondary | ICD-10-CM | POA: Diagnosis not present

## 2015-11-03 DIAGNOSIS — Z419 Encounter for procedure for purposes other than remedying health state, unspecified: Secondary | ICD-10-CM

## 2015-11-03 DIAGNOSIS — Z01811 Encounter for preprocedural respiratory examination: Secondary | ICD-10-CM | POA: Diagnosis present

## 2015-11-03 DIAGNOSIS — Z87891 Personal history of nicotine dependence: Secondary | ICD-10-CM

## 2015-11-03 DIAGNOSIS — I10 Essential (primary) hypertension: Secondary | ICD-10-CM | POA: Diagnosis present

## 2015-11-03 DIAGNOSIS — Y92129 Unspecified place in nursing home as the place of occurrence of the external cause: Secondary | ICD-10-CM

## 2015-11-03 DIAGNOSIS — Z800 Family history of malignant neoplasm of digestive organs: Secondary | ICD-10-CM | POA: Diagnosis not present

## 2015-11-03 DIAGNOSIS — R000 Tachycardia, unspecified: Secondary | ICD-10-CM | POA: Diagnosis not present

## 2015-11-03 DIAGNOSIS — Z833 Family history of diabetes mellitus: Secondary | ICD-10-CM | POA: Diagnosis not present

## 2015-11-03 DIAGNOSIS — Z79899 Other long term (current) drug therapy: Secondary | ICD-10-CM

## 2015-11-03 DIAGNOSIS — Z8546 Personal history of malignant neoplasm of prostate: Secondary | ICD-10-CM | POA: Diagnosis not present

## 2015-11-03 DIAGNOSIS — I959 Hypotension, unspecified: Secondary | ICD-10-CM | POA: Diagnosis not present

## 2015-11-03 DIAGNOSIS — Z01810 Encounter for preprocedural cardiovascular examination: Secondary | ICD-10-CM

## 2015-11-03 DIAGNOSIS — F329 Major depressive disorder, single episode, unspecified: Secondary | ICD-10-CM | POA: Diagnosis present

## 2015-11-03 DIAGNOSIS — R509 Fever, unspecified: Secondary | ICD-10-CM

## 2015-11-03 LAB — ABO/RH: ABO/RH(D): A NEG

## 2015-11-03 LAB — COMPREHENSIVE METABOLIC PANEL
ALBUMIN: 2.3 g/dL — AB (ref 3.5–5.0)
ALT: 6 U/L — ABNORMAL LOW (ref 17–63)
ANION GAP: 4 — AB (ref 5–15)
AST: 14 U/L — AB (ref 15–41)
Alkaline Phosphatase: 42 U/L (ref 38–126)
BILIRUBIN TOTAL: 0.8 mg/dL (ref 0.3–1.2)
BUN: 11 mg/dL (ref 6–20)
CHLORIDE: 118 mmol/L — AB (ref 101–111)
CO2: 19 mmol/L — ABNORMAL LOW (ref 22–32)
Calcium: 6.4 mg/dL — CL (ref 8.9–10.3)
Creatinine, Ser: 0.73 mg/dL (ref 0.61–1.24)
GFR calc Af Amer: >60 mL/min (ref >60)
GFR calc non Af Amer: >60 mL/min (ref >60)
GLUCOSE: 75 mg/dL (ref 65–99)
POTASSIUM: 3.2 mmol/L — AB (ref 3.5–5.1)
Sodium: 141 mmol/L (ref 135–145)
TOTAL PROTEIN: 4.4 g/dL — AB (ref 6.5–8.1)

## 2015-11-03 LAB — CBC WITH DIFFERENTIAL/PLATELET
BASOS ABS: 0 10*3/uL (ref 0–0.1)
BASOS PCT: 1 %
EOS ABS: 0.2 10*3/uL (ref 0–0.7)
EOS PCT: 2 %
HEMATOCRIT: 39.3 % — AB (ref 40.0–52.0)
Hemoglobin: 12.9 g/dL — ABNORMAL LOW (ref 13.0–18.0)
Lymphocytes Relative: 40 %
Lymphs Abs: 2.9 10*3/uL (ref 1.0–3.6)
MCH: 32.2 pg (ref 26.0–34.0)
MCHC: 32.9 g/dL (ref 32.0–36.0)
MCV: 98.1 fL (ref 80.0–100.0)
MONO ABS: 0.6 10*3/uL (ref 0.2–1.0)
MONOS PCT: 8 %
Neutro Abs: 3.6 10*3/uL (ref 1.4–6.5)
Neutrophils Relative %: 49 %
PLATELETS: 214 10*3/uL (ref 150–440)
RBC: 4 MIL/uL — ABNORMAL LOW (ref 4.40–5.90)
RDW: 13.5 % (ref 11.5–14.5)
WBC: 7.3 10*3/uL (ref 3.8–10.6)

## 2015-11-03 LAB — URINALYSIS COMPLETE WITH MICROSCOPIC (ARMC ONLY)
BACTERIA UA: NONE SEEN
Bilirubin Urine: NEGATIVE
GLUCOSE, UA: NEGATIVE mg/dL
Hgb urine dipstick: NEGATIVE
Ketones, ur: NEGATIVE mg/dL
Leukocytes, UA: NEGATIVE
Nitrite: NEGATIVE
PROTEIN: NEGATIVE mg/dL
Specific Gravity, Urine: 1.02 (ref 1.005–1.030)
pH: 6 (ref 5.0–8.0)

## 2015-11-03 LAB — TYPE AND SCREEN
ABO/RH(D): A NEG
ANTIBODY SCREEN: NEGATIVE

## 2015-11-03 LAB — PROTIME-INR
INR: 1.03
PROTHROMBIN TIME: 13.7 s (ref 11.4–15.0)

## 2015-11-03 LAB — MRSA PCR SCREENING: MRSA BY PCR: NEGATIVE

## 2015-11-03 MED ORDER — CLINDAMYCIN PHOSPHATE 600 MG/50ML IV SOLN
600.0000 mg | INTRAVENOUS | Status: AC
  Administered 2015-11-04: 600 mg via INTRAVENOUS
  Filled 2015-11-03: qty 50

## 2015-11-03 MED ORDER — SODIUM CHLORIDE 0.9 % IV SOLN
INTRAVENOUS | Status: DC
  Administered 2015-11-03 – 2015-11-04 (×2): via INTRAVENOUS

## 2015-11-03 MED ORDER — DONEPEZIL HCL 5 MG PO TABS
10.0000 mg | ORAL_TABLET | Freq: Every day | ORAL | Status: DC
  Administered 2015-11-05 – 2015-11-07 (×3): 10 mg via ORAL
  Filled 2015-11-03 (×3): qty 2

## 2015-11-03 MED ORDER — ASPIRIN 81 MG PO CHEW: 324.0000 mg | CHEWABLE_TABLET | Freq: Once | ORAL | Status: DC

## 2015-11-03 MED ORDER — TRAMADOL HCL 50 MG PO TABS
50.0000 mg | ORAL_TABLET | Freq: Three times a day (TID) | ORAL | Status: DC
Start: 2015-11-03 — End: 2015-11-04
  Administered 2015-11-03: 50 mg via ORAL
  Filled 2015-11-03: qty 1

## 2015-11-03 MED ORDER — MEMANTINE HCL 10 MG PO TABS
10.0000 mg | ORAL_TABLET | Freq: Every day | ORAL | Status: DC
  Administered 2015-11-05 – 2015-11-07 (×3): 10 mg via ORAL
  Filled 2015-11-03 (×3): qty 1

## 2015-11-03 MED ORDER — ASPIRIN 81 MG PO CHEW: 81.0000 mg | CHEWABLE_TABLET | Freq: Every day | ORAL | Status: DC

## 2015-11-03 MED ORDER — POTASSIUM CHLORIDE 20 MEQ PO PACK
40.0000 meq | PACK | Freq: Two times a day (BID) | ORAL | Status: DC
  Administered 2015-11-03 – 2015-11-07 (×7): 40 meq via ORAL
  Filled 2015-11-03 (×7): qty 2

## 2015-11-03 MED ORDER — MORPHINE SULFATE (PF) 2 MG/ML IV SOLN: 2.0000 mg | INTRAVENOUS | Status: DC | PRN

## 2015-11-03 MED ORDER — MELATONIN 3 MG PO TABS: 1.0000 | ORAL_TABLET | Freq: Every evening | ORAL | Status: DC | PRN

## 2015-11-03 MED ORDER — ONDANSETRON HCL 4 MG/2ML IJ SOLN
4.0000 mg | Freq: Once | INTRAMUSCULAR | Status: AC
  Administered 2015-11-03: 4 mg via INTRAVENOUS
  Filled 2015-11-03: qty 2

## 2015-11-03 MED ORDER — FENTANYL CITRATE (PF) 100 MCG/2ML IJ SOLN
50.0000 ug | Freq: Once | INTRAMUSCULAR | Status: AC
  Administered 2015-11-03: 50 ug via INTRAVENOUS
  Filled 2015-11-03: qty 2

## 2015-11-03 MED ORDER — ACETAMINOPHEN 500 MG PO TABS
500.0000 mg | ORAL_TABLET | Freq: Three times a day (TID) | ORAL | Status: DC
  Administered 2015-11-03: 500 mg via ORAL
  Filled 2015-11-03: qty 1

## 2015-11-03 MED ORDER — OXYCODONE-ACETAMINOPHEN 5-325 MG PO TABS
1.0000 | ORAL_TABLET | ORAL | Status: DC | PRN
Start: 2015-11-03 — End: 2015-11-03
  Administered 2015-11-03: 1 via ORAL
  Filled 2015-11-03: qty 1

## 2015-11-03 MED ORDER — FLUOXETINE HCL 10 MG PO CAPS
10.0000 mg | ORAL_CAPSULE | Freq: Every day | ORAL | Status: DC
  Administered 2015-11-05 – 2015-11-07 (×3): 10 mg via ORAL
  Filled 2015-11-03 (×4): qty 1

## 2015-11-03 MED ORDER — OXYCODONE HCL 5 MG PO TABS: 5.0000 mg | ORAL_TABLET | ORAL | Status: DC | PRN

## 2015-11-03 MED ORDER — METOPROLOL TARTRATE 25 MG PO TABS
12.5000 mg | ORAL_TABLET | Freq: Two times a day (BID) | ORAL | Status: DC
  Administered 2015-11-03 – 2015-11-04 (×2): 12.5 mg via ORAL
  Filled 2015-11-03 (×2): qty 1

## 2015-11-03 MED ORDER — HEPARIN SODIUM (PORCINE) 5000 UNIT/ML IJ SOLN
5000.0000 [IU] | Freq: Three times a day (TID) | INTRAMUSCULAR | Status: DC
Start: 2015-11-03 — End: 2015-11-04
  Filled 2015-11-03: qty 1

## 2015-11-03 NOTE — ED Notes (Signed)
Patient transported to radiology

## 2015-11-03 NOTE — Consult Note (Signed)
ORTHOPAEDIC CONSULTATION  PATIENT NAME: Jorge Moore DOB: 01-Feb-1924  MRN: [id number]  REQUESTING PHYSICIAN: Dustin Flock, MD  Chief Complaint: Right hip pain  HPI: Jorge Moore is a 82 y.o. male resident of [place] who complains of  right hip pain. The patient apparently sustained a mechanical fall earlier this afternoon and landed on his right hip and side. He complained of the immediate onset of right hip and leg pain and was unable to stand or bear weight due to the pain. He denied any other injuries. Prior to the fall he was a wheelchair ambulator and occasionally ambulated with a walker. 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  Past Medical History  Diagnosis Date  . Depressed   . Dementia   . GERD (gastroesophageal reflux disease)   . Prostate cancer ([place])   . Atrial fibrillation ([place])   . Ataxia   . PE (pulmonary embolism)    No past surgical history on file. Social History   Social History  . Marital Status: Widowed    Spouse Name: N/A  . Number of Children: N/A  . Years of Education: N/A   Social History Main Topics  . Smoking status: Former Research scientist (life sciences)  . Smokeless tobacco: None  . Alcohol Use: No  . Drug Use: None  . Sexual Activity: Not Currently   Other Topics Concern  . None   Social History Narrative   Family History  Problem Relation Age of Onset  . Colon cancer Mother   . Diabetes Father   . Diabetes Brother    Allergies  Allergen Reactions  . Penicillins     Reaction: unknown    Prior to Admission medications   Medication Sig Start Date End Date Taking? Authorizing Provider  acetaminophen (TYLENOL) 325 MG tablet Take 650 mg by mouth every 4 (four) hours as needed.   Yes Historical Provider, MD  acetaminophen (TYLENOL) 500 MG tablet Take 500 mg by mouth 3 (three) times daily. Along with tramadol   Yes Historical Provider, MD  aspirin 81 MG chewable tablet Chew 81 mg by mouth daily.   Yes Historical Provider, MD   donepezil (ARICEPT) 10 MG tablet Take 10 mg by mouth daily.    Yes Historical Provider, MD  FLUoxetine (PROZAC) 10 MG capsule Take 10 mg by mouth daily.   Yes Historical Provider, MD  Melatonin 3 MG TABS Take 1 tablet by mouth at bedtime as needed.   Yes Historical Provider, MD  memantine (NAMENDA) 10 MG tablet Take 10 mg by mouth daily.   Yes Historical Provider, MD  metoprolol tartrate (LOPRESSOR) 25 MG tablet Take 12.5 mg by mouth 2 (two) times daily.   Yes Historical Provider, MD  Loma Boston (OYSTER CALCIUM) 500 MG TABS tablet Take 500 mg of elemental calcium by mouth 2 (two) times daily.   Yes Historical Provider, MD  traMADol (ULTRAM) 50 MG tablet Take 50 mg by mouth 3 (three) times daily.   Yes Historical Provider, MD   Ct Head Wo Contrast  11/03/2015  CLINICAL DATA:  Head injury after fall at nursing facility. EXAM: CT HEAD WITHOUT CONTRAST CT CERVICAL SPINE WITHOUT CONTRAST TECHNIQUE: Multidetector CT imaging of the head and cervical spine was performed following the standard protocol without intravenous contrast. Multiplanar CT image reconstructions of the cervical spine were also generated. COMPARISON:  CT scan of cervical spine of September 19, 2015. CT scan of June 25, 2015. FINDINGS: CT HEAD FINDINGS Bony calvarium appears intact.  Mild diffuse cortical atrophy is noted. Moderate chronic ischemic white matter disease is noted. Probable old left cerebellar infarction. No mass effect or midline shift is noted. Ventricular size is within normal limits. There is no evidence of mass lesion, hemorrhage or acute infarction. Stable old lacunar infarction in right basal ganglia is noted. CT CERVICAL SPINE FINDINGS Reversal of normal lordosis of cervical spine is noted most likely degenerative in origin. Grade 1 anterolisthesis of C4-5 is noted secondary to posterior facet joint hypertrophy. Severe degenerative disc disease is noted at C5-6, C6-7 and C7-T1. Visualized lung apices are unremarkable.  IMPRESSION: Mild diffuse cortical atrophy. Moderate chronic ischemic white matter disease. Stable probable old left cerebellar infarction. No acute intracranial abnormality seen. Severe multilevel degenerative disc disease. No acute abnormality seen in the cervical spine. Electronically Signed   By: Marijo Conception, M.D.   On: 11/03/2015 20:02   Ct Cervical Spine Wo Contrast  11/03/2015  CLINICAL DATA:  Head injury after fall at nursing facility. EXAM: CT HEAD WITHOUT CONTRAST CT CERVICAL SPINE WITHOUT CONTRAST TECHNIQUE: Multidetector CT imaging of the head and cervical spine was performed following the standard protocol without intravenous contrast. Multiplanar CT image reconstructions of the cervical spine were also generated. COMPARISON:  CT scan of cervical spine of September 19, 2015. CT scan of June 25, 2015. FINDINGS: CT HEAD FINDINGS Bony calvarium appears intact. Mild diffuse cortical atrophy is noted. Moderate chronic ischemic white matter disease is noted. Probable old left cerebellar infarction. No mass effect or midline shift is noted. Ventricular size is within normal limits. There is no evidence of mass lesion, hemorrhage or acute infarction. Stable old lacunar infarction in right basal ganglia is noted. CT CERVICAL SPINE FINDINGS Reversal of normal lordosis of cervical spine is noted most likely degenerative in origin. Grade 1 anterolisthesis of C4-5 is noted secondary to posterior facet joint hypertrophy. Severe degenerative disc disease is noted at C5-6, C6-7 and C7-T1. Visualized lung apices are unremarkable. IMPRESSION: Mild diffuse cortical atrophy. Moderate chronic ischemic white matter disease. Stable probable old left cerebellar infarction. No acute intracranial abnormality seen. Severe multilevel degenerative disc disease. No acute abnormality seen in the cervical spine. Electronically Signed   By: Marijo Conception, M.D.   On: 11/03/2015 20:02   Dg Chest Port 1 View  11/03/2015   CLINICAL DATA:  Preop chest exam.  Fall today. EXAM: PORTABLE CHEST 1 VIEW COMPARISON:  01/10/2015 FINDINGS: Normal heart size. Negative aortic and hilar contours. Right upper mediastinal convexity is from ectatic vessels based on 2016 chest CT. Blunting of the lateral left costophrenic sulcus is scarring based on 2016 chest CT. There is no edema, consolidation, effusion, or pneumothorax. No visible fracture. IMPRESSION: No active disease. Electronically Signed   By: Monte Fantasia M.D.   On: 11/03/2015 20:43   Dg Hip Unilat With Pelvis 2-3 Views Right  11/03/2015  CLINICAL DATA:  Fall today with right hip pain, initial encounter EXAM: DG HIP (WITH OR WITHOUT PELVIS) 2-3V RIGHT COMPARISON:  None. FINDINGS: There is a subtle undisplaced fracture at the base of the right femoral neck involving the intratrochanteric region. Pelvic ring is intact. Postsurgical changes are noted. IMPRESSION: Undisplaced fracture at the base of the right femoral neck extending into the intertrochanteric region. Electronically Signed   By: Inez Catalina M.D.   On: 11/03/2015 20:08    ROS: Unable to determine secondary to dementia.  Physical Exam: General: Alert and alert in obvious discomfort. HEENT: Atraumatic and normocephalic. Sclera are  clear. Extraocular motion is intact. Oropharynx is clear with moist mucosa. Neck: Supple, nontender, good range of motion. No JVD or carotid bruits. Lungs: Clear to auscultation bilaterally. Cardiovascular: Regular rate and rhythm with normal S1 and S2. No murmurs. No gallops or rubs. Pedal pulses are palpable bilaterally. Homans test is negative bilaterally. No significant pretibial or ankle edema. Abdomen: Soft, nontender, and nondistended. Bowel sounds are present. Skin: No lesions in the area of chief complaint Neurologic: Awake, alert, but disoriented. Sensory function is grossly intact. Motor strength is felt to be 5 over 5 bilaterally. No clonus or tremor. Good motor  coordination. Lymphatic: No axillary or cervical lymphadenopathy  MUSCULOSKELETAL: Examination of the right lower extremity demonstrated pain with any attempted motion of the right hip. No gross shortening or excessive rotation to the right lower extremity. No knee effusion. No tenderness or swelling about the right foot or ankle.  Assessment: Right intertrochanteric femur fracture  Plan: The findings were discussed in detail with the patient's daughter. Recommendation was made for open reduction and internal fixation of the right intertrochanteric femur fracture. The usual perioperative course was discussed. The risks and benefits of surgical intervention were reviewed. The patient's daughter expressed understanding of the risks and benefits and agreed with plans for surgical intervention.   Jahnay Lantier P. Holley Bouche M.D.

## 2015-11-03 NOTE — H&P (Signed)
[facility] NAME: Jorge Moore    MR#:  [id number]  DATE OF BIRTH:  Jan 30, 1924  DATE OF ADMISSION:  11/03/2015  PRIMARY CARE PHYSICIAN: Adrian Prows, MD   REQUESTING/REFERRING PHYSICIAN: [place]  CHIEF COMPLAINT:   Chief Complaint  Patient presents with  . Fall    HISTORY OF PRESENT ILLNESS: Jorge Moore  is a 82 y.o. male with a known history of depression, dementia, prostate cancer, atrial fibrillation, previously on Coumadin but not taking for almost one year, ataxia- lives in the [place]- able to walk with a walker. Today he fell down over there and has right hip fracture so sent to emergency room, hospitalist service was contacted for admission. Patient is not able to give any history because of dementia, history obtained from his daughter.  PAST MEDICAL HISTORY:   Past Medical History  Diagnosis Date  . Depressed   . Dementia   . GERD (gastroesophageal reflux disease)   . Prostate cancer ([place])   . Atrial fibrillation ([place])   . Ataxia   . PE (pulmonary embolism)     PAST SURGICAL HISTORY: No past surgical history on file.  SOCIAL HISTORY:  Social History  Substance Use Topics  . Smoking status: Former Research scientist (life sciences)  . Smokeless tobacco: Not on file  . Alcohol Use: No    FAMILY HISTORY:  Family History  Problem Relation Age of Onset  . Colon cancer Mother   . Diabetes Father   . Diabetes Brother     DRUG ALLERGIES:  Allergies  Allergen Reactions  . Penicillins     Reaction: unknown     REVIEW OF SYSTEMS:   Patient has complained of pain in the right hip but he is not able to give any more history or details because of his dementia.  MEDICATIONS AT HOME:  Prior to Admission medications   Medication Sig Start Date End Date Taking? Authorizing Provider  acetaminophen (TYLENOL) 325 MG tablet Take 650 mg by mouth every 4 (four) hours as needed.   Yes Historical Provider, MD  acetaminophen  (TYLENOL) 500 MG tablet Take 500 mg by mouth 3 (three) times daily. Along with tramadol   Yes Historical Provider, MD  aspirin 81 MG chewable tablet Chew 81 mg by mouth daily.   Yes Historical Provider, MD  donepezil (ARICEPT) 10 MG tablet Take 10 mg by mouth daily.    Yes Historical Provider, MD  FLUoxetine (PROZAC) 10 MG capsule Take 10 mg by mouth daily.   Yes Historical Provider, MD  Melatonin 3 MG TABS Take 1 tablet by mouth at bedtime as needed.   Yes Historical Provider, MD  memantine (NAMENDA) 10 MG tablet Take 10 mg by mouth daily.   Yes Historical Provider, MD  metoprolol tartrate (LOPRESSOR) 25 MG tablet Take 12.5 mg by mouth 2 (two) times daily.   Yes Historical Provider, MD  Loma Boston (OYSTER CALCIUM) 500 MG TABS tablet Take 500 mg of elemental calcium by mouth 2 (two) times daily.   Yes Historical Provider, MD  traMADol (ULTRAM) 50 MG tablet Take 50 mg by mouth 3 (three) times daily.   Yes Historical Provider, MD      PHYSICAL EXAMINATION:   VITAL SIGNS: Blood pressure 128/62, pulse 52, temperature 97.9 F (36.6 C), temperature source Oral, resp. rate 17, height 5\' 8"  (1.727 m), weight 72.576 kg (160 lb), SpO2 100 %.  GENERAL:  82 y.o.-year-old patient lying in the bed with no acute  distress.  EYES: Pupils equal, round, reactive to light and accommodation. No scleral icterus. Extraocular muscles intact.  HEENT: Head atraumatic, normocephalic. Oropharynx and nasopharynx clear.  NECK:  Supple, no jugular venous distention. No thyroid enlargement, no tenderness.  LUNGS: Normal breath sounds bilaterally, no wheezing, rales,rhonchi or crepitation. No use of accessory muscles of respiration.  CARDIOVASCULAR: S1, S2 normal. No murmurs, rubs, or gallops.  ABDOMEN: Soft, nontender, nondistended. Bowel sounds present. No organomegaly or mass.  EXTREMITIES: No pedal edema, cyanosis, or clubbing. Right hip tenderness. NEUROLOGIC: Cranial nerves II through XII are intact. Muscle strength  5/5 in all extremities except right lower extremity where he is not moving much because of fracture and pain.. Sensation intact. Gait not checked.  PSYCHIATRIC: The patient is alert and oriented x 3.  SKIN: No obvious rash, lesion, or ulcer.   LABORATORY PANEL:   CBC  Recent Labs Lab 11/03/15 1929  WBC 7.3  HGB 12.9*  HCT 39.3*  PLT 214  MCV 98.1  MCH 32.2  MCHC 32.9  RDW 13.5  LYMPHSABS 2.9  MONOABS 0.6  EOSABS 0.2  BASOSABS 0.0   ------------------------------------------------------------------------------------------------------------------  Chemistries   Recent Labs Lab 11/03/15 1929  NA 141  K 3.2*  CL 118*  CO2 19*  GLUCOSE 75  BUN 11  CREATININE 0.73  CALCIUM 6.4*  AST 14*  ALT 6*  ALKPHOS 42  BILITOT 0.8   ------------------------------------------------------------------------------------------------------------------ estimated creatinine clearance is 60.6 mL/min (by C-G formula based on Cr of 0.73). ------------------------------------------------------------------------------------------------------------------ No results for input(s): TSH, T4TOTAL, T3FREE, THYROIDAB in the last 72 hours.  Invalid input(s): FREET3   Coagulation profile  Recent Labs Lab 11/03/15 1929  INR 1.03   ------------------------------------------------------------------------------------------------------------------- No results for input(s): DDIMER in the last 72 hours. -------------------------------------------------------------------------------------------------------------------  Cardiac Enzymes No results for input(s): CKMB, TROPONINI, MYOGLOBIN in the last 168 hours.  Invalid input(s): CK ------------------------------------------------------------------------------------------------------------------ Invalid input(s): POCBNP  ---------------------------------------------------------------------------------------------------------------  Urinalysis     Component Value Date/Time   COLORURINE YELLOW* 11/03/2015 2027   COLORURINE Yellow 01/10/2015 1842   APPEARANCEUR CLEAR* 11/03/2015 2027   APPEARANCEUR Clear 01/10/2015 1842   LABSPEC 1.020 11/03/2015 2027   LABSPEC 1.032 01/10/2015 1842   PHURINE 6.0 11/03/2015 2027   PHURINE 5.0 01/10/2015 1842   GLUCOSEU NEGATIVE 11/03/2015 2027   GLUCOSEU Negative 01/10/2015 1842   HGBUR NEGATIVE 11/03/2015 2027   HGBUR 2+ 01/10/2015 1842   BILIRUBINUR NEGATIVE 11/03/2015 2027   BILIRUBINUR Negative 01/10/2015 1842   KETONESUR NEGATIVE 11/03/2015 2027   KETONESUR Trace 01/10/2015 1842   PROTEINUR NEGATIVE 11/03/2015 2027   PROTEINUR Negative 01/10/2015 1842   NITRITE NEGATIVE 11/03/2015 2027   NITRITE Negative 01/10/2015 1842   LEUKOCYTESUR NEGATIVE 11/03/2015 2027   LEUKOCYTESUR Negative 01/10/2015 1842     RADIOLOGY: Ct Head Wo Contrast  11/03/2015  CLINICAL DATA:  Head injury after fall at nursing facility. EXAM: CT HEAD WITHOUT CONTRAST CT CERVICAL SPINE WITHOUT CONTRAST TECHNIQUE: Multidetector CT imaging of the head and cervical spine was performed following the standard protocol without intravenous contrast. Multiplanar CT image reconstructions of the cervical spine were also generated. COMPARISON:  CT scan of cervical spine of September 19, 2015. CT scan of June 25, 2015. FINDINGS: CT HEAD FINDINGS Bony calvarium appears intact. Mild diffuse cortical atrophy is noted. Moderate chronic ischemic white matter disease is noted. Probable old left cerebellar infarction. No mass effect or midline shift is noted. Ventricular size is within normal limits. There is no evidence of mass lesion, hemorrhage or acute infarction. Stable  old lacunar infarction in right basal ganglia is noted. CT CERVICAL SPINE FINDINGS Reversal of normal lordosis of cervical spine is noted most likely degenerative in origin. Grade 1 anterolisthesis of C4-5 is noted secondary to posterior facet joint hypertrophy. Severe  degenerative disc disease is noted at C5-6, C6-7 and C7-T1. Visualized lung apices are unremarkable. IMPRESSION: Mild diffuse cortical atrophy. Moderate chronic ischemic white matter disease. Stable probable old left cerebellar infarction. No acute intracranial abnormality seen. Severe multilevel degenerative disc disease. No acute abnormality seen in the cervical spine. Electronically Signed   By: Marijo Conception, M.D.   On: 11/03/2015 20:02   Ct Cervical Spine Wo Contrast  11/03/2015  CLINICAL DATA:  Head injury after fall at nursing facility. EXAM: CT HEAD WITHOUT CONTRAST CT CERVICAL SPINE WITHOUT CONTRAST TECHNIQUE: Multidetector CT imaging of the head and cervical spine was performed following the standard protocol without intravenous contrast. Multiplanar CT image reconstructions of the cervical spine were also generated. COMPARISON:  CT scan of cervical spine of September 19, 2015. CT scan of June 25, 2015. FINDINGS: CT HEAD FINDINGS Bony calvarium appears intact. Mild diffuse cortical atrophy is noted. Moderate chronic ischemic white matter disease is noted. Probable old left cerebellar infarction. No mass effect or midline shift is noted. Ventricular size is within normal limits. There is no evidence of mass lesion, hemorrhage or acute infarction. Stable old lacunar infarction in right basal ganglia is noted. CT CERVICAL SPINE FINDINGS Reversal of normal lordosis of cervical spine is noted most likely degenerative in origin. Grade 1 anterolisthesis of C4-5 is noted secondary to posterior facet joint hypertrophy. Severe degenerative disc disease is noted at C5-6, C6-7 and C7-T1. Visualized lung apices are unremarkable. IMPRESSION: Mild diffuse cortical atrophy. Moderate chronic ischemic white matter disease. Stable probable old left cerebellar infarction. No acute intracranial abnormality seen. Severe multilevel degenerative disc disease. No acute abnormality seen in the cervical spine. Electronically  Signed   By: Marijo Conception, M.D.   On: 11/03/2015 20:02   Dg Chest Port 1 View  11/03/2015  CLINICAL DATA:  Preop chest exam.  Fall today. EXAM: PORTABLE CHEST 1 VIEW COMPARISON:  01/10/2015 FINDINGS: Normal heart size. Negative aortic and hilar contours. Right upper mediastinal convexity is from ectatic vessels based on 2016 chest CT. Blunting of the lateral left costophrenic sulcus is scarring based on 2016 chest CT. There is no edema, consolidation, effusion, or pneumothorax. No visible fracture. IMPRESSION: No active disease. Electronically Signed   By: Monte Fantasia M.D.   On: 11/03/2015 20:43   Dg Hip Unilat With Pelvis 2-3 Views Right  11/03/2015  CLINICAL DATA:  Fall today with right hip pain, initial encounter EXAM: DG HIP (WITH OR WITHOUT PELVIS) 2-3V RIGHT COMPARISON:  None. FINDINGS: There is a subtle undisplaced fracture at the base of the right femoral neck involving the intratrochanteric region. Pelvic ring is intact. Postsurgical changes are noted. IMPRESSION: Undisplaced fracture at the base of the right femoral neck extending into the intertrochanteric region. Electronically Signed   By: Inez Catalina M.D.   On: 11/03/2015 20:08    IMPRESSION AND PLAN:  * Right hip fracture  Admitted to medical floor, orthopedic consult for further management.  As per daughter patient does not have any cardiac history.  He had A. fib and he was taking Coumadin but was taken off for almost a year ago.  At baseline he is able to walk with a walker, and I would recommend to proceed with surgery without  any further testing to get back his functional status.  He is at moderate risk for this surgery because of his old age.  I do not see any echocardiogram or other cardiac workup done in the past.  We will give Percocet for pain control and heparin subcutaneous, and keep him nothing by mouth for possible surgery tomorrow morning.  * Hypertension  Continue home medication.  * Depression and  dementia  Continue medication.   All the records are reviewed and case discussed with ED provider. Management plans discussed with the patient, family and they are in agreement.  CODE STATUS:Full code . Code Status History    This patient does not have a recorded code status. Please follow your organizational policy for patients in this situation.       TOTAL TIME TAKING CARE OF THIS PATIENT: 45 minutes.    Vaughan Basta M.D on 11/03/2015   Between 7am to 6pm - Pager - [phone]  After 6pm go to [email or web address] - password [email or web address] [facility]  [facility]  Office  [phone]  CC: Primary care physician; Adrian Prows, MD   Note: This dictation was prepared with Dragon dictation along with smaller phrase technology. Any transcriptional errors that result from this process are unintentional.

## 2015-11-03 NOTE — Progress Notes (Signed)

## 2015-11-03 NOTE — ED Provider Notes (Addendum)
[facility] Emergency Department Provider Note  ____________________________________________   I have reviewed the triage vital signs and the nursing notes.   HISTORY  Chief Complaint Fall    HPI Jorge Moore is a 82 y.o. male presents today complaining of hip pain. She needs to be on Coumadin but no longer is. The patient was noted by triage nurse immediately prior to my arrival in the room is nonverbal but I think he is just hard of hearing as he was able to talk to me with no difficulty. The patient has a history of mild dementia. His daughter has come to the bedside and says that he is at his baseline. By report it was a non-syncopal fall. He is not clear exactly why he fell. No further history is really available from this patient.  Past Medical History  Diagnosis Date  . Depressed   . Dementia   . GERD (gastroesophageal reflux disease)   . Prostate cancer ([place])   . Atrial fibrillation ([place])   . Ataxia   . PE (pulmonary embolism)     There are no active problems to display for this patient.   No past surgical history on file.  Current Outpatient Rx  Name  Route  Sig  Dispense  Refill  . acetaminophen (TYLENOL) 325 MG tablet   Oral   Take 650 mg by mouth every 4 (four) hours as needed.         Marland Kitchen acetaminophen (TYLENOL) 500 MG tablet   Oral   Take 500 mg by mouth 3 (three) times daily. Along with tramadol         . aspirin 81 MG chewable tablet   Oral   Chew 81 mg by mouth daily.         Marland Kitchen donepezil (ARICEPT) 10 MG tablet   Oral   Take 10 mg by mouth daily.          Marland Kitchen FLUoxetine (PROZAC) 10 MG capsule   Oral   Take 10 mg by mouth daily.         . Melatonin 3 MG TABS   Oral   Take 1 tablet by mouth at bedtime as needed.         . memantine (NAMENDA) 10 MG tablet   Oral   Take 10 mg by mouth daily.         . metoprolol tartrate (LOPRESSOR) 25 MG tablet   Oral   Take 12.5 mg by mouth 2 (two) times daily.          [place] (OYSTER CALCIUM) 500 MG TABS tablet   Oral   Take 500 mg of elemental calcium by mouth 2 (two) times daily.         . traMADol (ULTRAM) 50 MG tablet   Oral   Take 50 mg by mouth 3 (three) times daily.           Allergies Penicillins  No family history on file.  Social History Social History  Substance Use Topics  . Smoking status: Former Research scientist (life sciences)  . Smokeless tobacco: None  . Alcohol Use: No    Review of Systems Cannot obtain second patient dementia  ____________________________________________   PHYSICAL EXAM:  VITAL SIGNS: ED Triage Vitals  Enc Vitals Group     BP 11/03/15 1923 109/61 mmHg     Pulse Rate 11/03/15 1923 57     Resp 11/03/15 1923 16     Temp 11/03/15 1923 97.9 F (  36.6 C)     Temp Source 11/03/15 1923 Oral     SpO2 11/03/15 1923 96 %     Weight 11/03/15 1923 160 lb (72.576 kg)     Height 11/03/15 1923 5\' 8"  (1.727 m)     Head Cir --      Peak Flow --      Pain Score --      Pain Loc --      Pain Edu? --      Excl. in [place]? --     Constitutional: Alert and oriented to name and place unsure of the dates. Nontoxic in appearance holding his right hip Eyes: Conjunctivae are normal. PERRL. EOMI. Head: Atraumatic. Nose: No congestion/rhinnorhea. Mouth/Throat: Mucous membranes are moist.  Oropharynx non-erythematous. Neck: No stridor.   Nontender with no meningismus Cardiovascular: Normal rate, regular rhythm. Grossly normal heart sounds.  Good peripheral circulation. Respiratory: Normal respiratory effort.  No retractions. Lungs CTAB. Abdominal: Soft and nontender. No distention. No guarding no rebound Back:  There is no focal tenderness or step off there is no midline tenderness there are no lesions noted. there is no CVA tenderness Musculoskeletal: Tenderness to palpation in the right hip, tenderness to ranging of the right hip no obvious deformity noted good strong distal pulses. No joint effusions, no DVT signs strong distal  pulses no edema Neurologic:  Normal speech and language. No gross focal neurologic deficits are appreciated.  Skin:  Skin is warm, dry and intact. No rash noted. Psychiatric: Mood and affect are normal. Speech and behavior are normal.  ____________________________________________   LABS (all labs ordered are listed, but only abnormal results are displayed)  Labs Reviewed  CBC WITH DIFFERENTIAL/PLATELET - Abnormal; Notable for the following:    RBC 4.00 (*)    Hemoglobin 12.9 (*)    HCT 39.3 (*)    All other components within normal limits  PROTIME-INR  COMPREHENSIVE METABOLIC PANEL  URINALYSIS COMPLETEWITH MICROSCOPIC ([facility] ONLY)  TYPE AND SCREEN  ABO/RH   ____________________________________________  EKG  I personally interpreted any EKGs ordered by me or triage Sinus rhythm, premature atrial complexes, no acute ST elevation or depression normal axis otherwise unremarkable EKG rate 61 ____________________________________________  RADIOLOGY  I reviewed any imaging ordered by me or triage that were performed during my shift ____________________________________________   PROCEDURES  Procedure(s) performed: None  Critical Care performed: None  ____________________________________________   INITIAL IMPRESSION / ASSESSMENT AND PLAN / ED COURSE  Pertinent labs & imaging results that were available during my care of the patient were reviewed by me and considered in my medical decision making (see chart for details).  Patient presents today after a fall at the facility. He has sustained a broken hip. We will place a Foley catheter he will be admitted to the hospital. The rest of his workup is unremarkable thus far.  Dr. Lake Bells of orthopedic surgery agrees with management and requests admission to the hospital service per protocol. ____________________________________________   FINAL CLINICAL IMPRESSION(S) / ED DIAGNOSES  Final diagnoses:  Hip fracture, right,  closed, initial encounter [facility])  Pre-op chest exam      This chart was dictated using voice recognition software.  Despite best efforts to proofread,  errors can occur which can change meaning.     Schuyler Amor, MD 11/03/15 2019  Schuyler Amor, MD 11/03/15 [id number]  Schuyler Amor, MD 11/03/15 2035

## 2015-11-03 NOTE — ED Notes (Addendum)
Per EMS patient presents to ED due to a fall from [facility]. Poss hip fracture. Patient takes coumadin. Unable to answer his pain level. Patient is moaning and grabbing at right leg.

## 2015-11-04 ENCOUNTER — Encounter
Admission: EM | Disposition: A | Discharge status: Skilled Nursing Facility | Payer: Self-pay | Source: Home / Self Care | Attending: Internal Medicine

## 2015-11-04 ENCOUNTER — Inpatient Hospital Stay: Payer: Medicare Other

## 2015-11-04 ENCOUNTER — Encounter: Payer: Self-pay | Admitting: *Deleted

## 2015-11-04 ENCOUNTER — Inpatient Hospital Stay: Payer: Medicare Other | Admitting: Certified Registered Nurse Anesthetist

## 2015-11-04 DIAGNOSIS — N359 Urethral stricture, unspecified: Secondary | ICD-10-CM

## 2015-11-04 DIAGNOSIS — R338 Other retention of urine: Secondary | ICD-10-CM

## 2015-11-04 HISTORY — PX: INTRAMEDULLARY (IM) NAIL INTERTROCHANTERIC: SHX5875

## 2015-11-04 LAB — URINALYSIS COMPLETE WITH MICROSCOPIC (ARMC ONLY)
BILIRUBIN URINE: NEGATIVE
Glucose, UA: NEGATIVE mg/dL
Ketones, ur: NEGATIVE mg/dL
Leukocytes, UA: NEGATIVE
Nitrite: NEGATIVE
PH: 5 (ref 5.0–8.0)
PROTEIN: 30 mg/dL — AB
Specific Gravity, Urine: 1.023 (ref 1.005–1.030)

## 2015-11-04 LAB — BASIC METABOLIC PANEL
Anion gap: 5 (ref 5–15)
BUN: 13 mg/dL (ref 6–20)
CALCIUM: 8.6 mg/dL — AB (ref 8.9–10.3)
CHLORIDE: 104 mmol/L (ref 101–111)
CO2: 29 mmol/L (ref 22–32)
CREATININE: 0.85 mg/dL (ref 0.61–1.24)
GFR calc non Af Amer: >60 mL/min (ref >60)
Glucose, Bld: 105 mg/dL — ABNORMAL HIGH (ref 65–99)
Potassium: 4.1 mmol/L (ref 3.5–5.1)
SODIUM: 138 mmol/L (ref 135–145)

## 2015-11-04 LAB — CBC
HCT: 37.4 % — ABNORMAL LOW (ref 40.0–52.0)
HCT: 29.7 % — ABNORMAL LOW (ref 40.0–52.0)
Hemoglobin: 12.4 g/dL — ABNORMAL LOW (ref 13.0–18.0)
Hemoglobin: 9.8 g/dL — ABNORMAL LOW (ref 13.0–18.0)
MCH: 32.6 pg (ref 26.0–34.0)
MCH: 32.3 pg (ref 26.0–34.0)
MCHC: 33.3 g/dL (ref 32.0–36.0)
MCHC: 33.1 g/dL (ref 32.0–36.0)
MCV: 98 fL (ref 80.0–100.0)
MCV: 97.7 fL (ref 80.0–100.0)
PLATELETS: 214 10*3/uL (ref 150–440)
PLATELETS: 170 10*3/uL (ref 150–440)
RBC: 3.81 MIL/uL — AB (ref 4.40–5.90)
RBC: 3.04 MIL/uL — AB (ref 4.40–5.90)
RDW: 13.2 % (ref 11.5–14.5)
RDW: 13.2 % (ref 11.5–14.5)
WBC: 11.8 10*3/uL — AB (ref 3.8–10.6)
WBC: 9.3 10*3/uL (ref 3.8–10.6)

## 2015-11-04 SURGERY — FIXATION, FRACTURE, INTERTROCHANTERIC, WITH INTRAMEDULLARY ROD
Anesthesia: Spinal | Site: Hip | Laterality: Right | Wound class: Clean

## 2015-11-04 MED ORDER — ACETAMINOPHEN 325 MG PO TABS: 650.0000 mg | ORAL_TABLET | Freq: Four times a day (QID) | ORAL | Status: DC | PRN

## 2015-11-04 MED ORDER — ACETAMINOPHEN 10 MG/ML IV SOLN
INTRAVENOUS | Status: DC | PRN
  Administered 2015-11-04: 1000 mg via INTRAVENOUS

## 2015-11-04 MED ORDER — NEOMYCIN-POLYMYXIN B GU 40-200000 IR SOLN
Status: DC | PRN
  Administered 2015-11-04: 4 mL

## 2015-11-04 MED ORDER — BISACODYL 10 MG RE SUPP
10.0000 mg | Freq: Every day | RECTAL | Status: DC | PRN
  Administered 2015-11-05: 10 mg via RECTAL
  Filled 2015-11-04: qty 1

## 2015-11-04 MED ORDER — METOCLOPRAMIDE HCL 5 MG/ML IJ SOLN: 5.0000 mg | Freq: Three times a day (TID) | INTRAMUSCULAR | Status: DC | PRN

## 2015-11-04 MED ORDER — MORPHINE SULFATE (PF) 2 MG/ML IV SOLN: 2.0000 mg | INTRAVENOUS | Status: DC | PRN

## 2015-11-04 MED ORDER — ACETAMINOPHEN 10 MG/ML IV SOLN
1000.0000 mg | Freq: Four times a day (QID) | INTRAVENOUS | Status: AC
  Administered 2015-11-04 – 2015-11-05 (×4): 1000 mg via INTRAVENOUS
  Filled 2015-11-04 (×4): qty 100

## 2015-11-04 MED ORDER — SENNOSIDES-DOCUSATE SODIUM 8.6-50 MG PO TABS
1.0000 | ORAL_TABLET | Freq: Two times a day (BID) | ORAL | Status: DC
  Administered 2015-11-04 – 2015-11-07 (×6): 1 via ORAL
  Filled 2015-11-04 (×6): qty 1

## 2015-11-04 MED ORDER — FENTANYL CITRATE (PF) 100 MCG/2ML IJ SOLN: 25.0000 ug | INTRAMUSCULAR | Status: DC | PRN

## 2015-11-04 MED ORDER — PANTOPRAZOLE SODIUM 40 MG PO TBEC
40.0000 mg | DELAYED_RELEASE_TABLET | Freq: Two times a day (BID) | ORAL | Status: DC
  Administered 2015-11-04 – 2015-11-07 (×6): 40 mg via ORAL
  Filled 2015-11-04 (×6): qty 1

## 2015-11-04 MED ORDER — ENOXAPARIN SODIUM 40 MG/0.4ML ~~LOC~~ SOLN
40.0000 mg | SUBCUTANEOUS | Status: DC
  Administered 2015-11-05 – 2015-11-07 (×3): 40 mg via SUBCUTANEOUS
  Filled 2015-11-04 (×3): qty 0.4

## 2015-11-04 MED ORDER — ACETAMINOPHEN 650 MG RE SUPP: 650.0000 mg | Freq: Four times a day (QID) | RECTAL | Status: DC | PRN

## 2015-11-04 MED ORDER — MENTHOL 3 MG MT LOZG
1.0000 | LOZENGE | OROMUCOSAL | Status: DC | PRN
  Filled 2015-11-04: qty 9

## 2015-11-04 MED ORDER — PROPOFOL 500 MG/50ML IV EMUL
INTRAVENOUS | Status: DC | PRN
  Administered 2015-11-04: 50 ug/kg/min via INTRAVENOUS

## 2015-11-04 MED ORDER — ACETAMINOPHEN 10 MG/ML IV SOLN
INTRAVENOUS | Status: AC
  Filled 2015-11-04: qty 100

## 2015-11-04 MED ORDER — SODIUM CHLORIDE 0.9 % IV BOLUS (SEPSIS)
500.0000 mL | Freq: Once | INTRAVENOUS | Status: AC
  Administered 2015-11-04: 500 mL via INTRAVENOUS

## 2015-11-04 MED ORDER — EPHEDRINE SULFATE 50 MG/ML IJ SOLN
INTRAMUSCULAR | Status: DC | PRN
  Administered 2015-11-04: 5 mg via INTRAVENOUS

## 2015-11-04 MED ORDER — METOCLOPRAMIDE HCL 5 MG PO TABS: 5.0000 mg | ORAL_TABLET | Freq: Three times a day (TID) | ORAL | Status: DC | PRN

## 2015-11-04 MED ORDER — NEOMYCIN-POLYMYXIN B GU 40-200000 IR SOLN
Status: AC
  Filled 2015-11-04: qty 4

## 2015-11-04 MED ORDER — CLINDAMYCIN PHOSPHATE 600 MG/50ML IV SOLN
600.0000 mg | Freq: Four times a day (QID) | INTRAVENOUS | Status: AC
  Administered 2015-11-04 (×2): 600 mg via INTRAVENOUS
  Filled 2015-11-04 (×2): qty 50

## 2015-11-04 MED ORDER — ONDANSETRON HCL 4 MG/2ML IJ SOLN: 4.0000 mg | Freq: Four times a day (QID) | INTRAMUSCULAR | Status: DC | PRN

## 2015-11-04 MED ORDER — FERROUS SULFATE 325 (65 FE) MG PO TABS
325.0000 mg | ORAL_TABLET | Freq: Two times a day (BID) | ORAL | Status: DC
  Administered 2015-11-05 – 2015-11-07 (×5): 325 mg via ORAL
  Filled 2015-11-04 (×5): qty 1

## 2015-11-04 MED ORDER — MAGNESIUM HYDROXIDE 400 MG/5ML PO SUSP
30.0000 mL | Freq: Every day | ORAL | Status: DC | PRN
  Administered 2015-11-05: 30 mL via ORAL
  Filled 2015-11-04 (×2): qty 30

## 2015-11-04 MED ORDER — SODIUM CHLORIDE 0.9 % IV SOLN
INTRAVENOUS | Status: DC
Start: 2015-11-04 — End: 2015-11-06
  Administered 2015-11-04 – 2015-11-06 (×4): via INTRAVENOUS

## 2015-11-04 MED ORDER — PHENOL 1.4 % MT LIQD
1.0000 | OROMUCOSAL | Status: DC | PRN
  Filled 2015-11-04: qty 177

## 2015-11-04 MED ORDER — OXYCODONE HCL 5 MG PO TABS
5.0000 mg | ORAL_TABLET | ORAL | Status: DC | PRN
  Administered 2015-11-05 – 2015-11-07 (×5): 5 mg via ORAL
  Filled 2015-11-04 (×5): qty 1

## 2015-11-04 MED ORDER — TETRACAINE HCL 1 % IJ SOLN
INTRAMUSCULAR | Status: AC
  Filled 2015-11-04: qty 2

## 2015-11-04 MED ORDER — ONDANSETRON HCL 4 MG/2ML IJ SOLN: 4.0000 mg | Freq: Once | INTRAMUSCULAR | Status: DC | PRN

## 2015-11-04 MED ORDER — FLEET ENEMA 7-19 GM/118ML RE ENEM: 1.0000 | ENEMA | Freq: Once | RECTAL | Status: DC | PRN

## 2015-11-04 MED ORDER — SODIUM CHLORIDE 0.9 % IV BOLUS (SEPSIS)
1000.0000 mL | Freq: Once | INTRAVENOUS | Status: AC
  Administered 2015-11-04: 1000 mL via INTRAVENOUS

## 2015-11-04 MED ORDER — ONDANSETRON HCL 4 MG PO TABS: 4.0000 mg | ORAL_TABLET | Freq: Four times a day (QID) | ORAL | Status: DC | PRN

## 2015-11-04 MED ORDER — FENTANYL CITRATE (PF) 100 MCG/2ML IJ SOLN
INTRAMUSCULAR | Status: DC | PRN
  Administered 2015-11-04 (×2): 25 ug via INTRAVENOUS

## 2015-11-04 MED ORDER — TRAMADOL HCL 50 MG PO TABS
50.0000 mg | ORAL_TABLET | ORAL | Status: DC | PRN
  Administered 2015-11-07: 50 mg via ORAL
  Filled 2015-11-04: qty 1

## 2015-11-04 SURGICAL SUPPLY — 43 items
BIT DRILL SPINE 4.0MMX260 (BIT) ×1
BIT DRILL SPINE 4.0X260 (BIT) ×2 IMPLANT
BLADE CLIPPER SURG (BLADE) ×3 IMPLANT
BLADE HELICAL 110 (Orthopedic Implant) ×2 IMPLANT
BLADE HELICAL 110MM (Orthopedic Implant) ×1 IMPLANT
BNDG COHESIVE 4X5 TAN STRL (GAUZE/BANDAGES/DRESSINGS) ×3 IMPLANT
CANISTER SUCT 1200ML W/VALVE (MISCELLANEOUS) ×3 IMPLANT
DRAPE C-ARMOR (DRAPES) IMPLANT
DRAPE SHEET LG 3/4 BI-LAMINATE (DRAPES) ×3 IMPLANT
DRAPE TABLE BACK 80X90 (DRAPES) ×3 IMPLANT
DRSG DERMACEA 8X12 NADH (GAUZE/BANDAGES/DRESSINGS) IMPLANT
DRSG OPSITE POSTOP 4X12 (GAUZE/BANDAGES/DRESSINGS) IMPLANT
DRSG OPSITE POSTOP 4X6 (GAUZE/BANDAGES/DRESSINGS) ×6 IMPLANT
DURAPREP 26ML APPLICATOR (WOUND CARE) ×3 IMPLANT
ELECT REM PT RETURN 9FT ADLT (ELECTROSURGICAL) ×3
ELECTRODE REM PT RTRN 9FT ADLT (ELECTROSURGICAL) ×1 IMPLANT
GAUZE SPONGE 4X4 12PLY STRL (GAUZE/BANDAGES/DRESSINGS) IMPLANT
GLOVE BIO SURGEON STRL SZ7.5 (GLOVE) ×6 IMPLANT
GLOVE BIO SURGEON STRL SZ8 (GLOVE) ×3 IMPLANT
GLOVE BIOGEL M STRL SZ7.5 (GLOVE) ×3 IMPLANT
GLOVE INDICATOR 8.0 STRL GRN (GLOVE) ×3 IMPLANT
GLOVE SURG XRAY 8.5 LX (GLOVE) ×3 IMPLANT
GOWN STRL REUS W/ TWL LRG LVL3 (GOWN DISPOSABLE) ×2 IMPLANT
GOWN STRL REUS W/TWL LRG LVL3 (GOWN DISPOSABLE) ×4
GUIDEWIRE 3.2X400 (WIRE) ×6 IMPLANT
KIT RM TURNOVER CYSTO AR (KITS) ×3 IMPLANT
MAT BLUE FLOOR 46X72 FLO (MISCELLANEOUS) ×3 IMPLANT
NAIL TROCH FX 11/130D 170-S (Nail) ×3 IMPLANT
NS IRRIG 1000ML POUR BTL (IV SOLUTION) ×3 IMPLANT
PACK HIP COMPR (MISCELLANEOUS) ×3 IMPLANT
REAMER ROD DEEP FLUTE 2.5X950 (INSTRUMENTS) ×3 IMPLANT
SCREW LOCKING IM 5.0MX40M (Screw) ×3 IMPLANT
SOL PREP PVP 2OZ (MISCELLANEOUS) ×3
SOLUTION PREP PVP 2OZ (MISCELLANEOUS) ×1 IMPLANT
STAPLER SKIN PROX 35W (STAPLE) ×3 IMPLANT
SUCTION FRAZIER HANDLE 10FR (MISCELLANEOUS) ×2
SUCTION TUBE FRAZIER 10FR DISP (MISCELLANEOUS) ×1 IMPLANT
SUT VIC AB 0 CT1 36 (SUTURE) ×3 IMPLANT
SUT VIC AB 1 CT1 36 (SUTURE) ×3 IMPLANT
SUT VIC AB 2-0 CT1 27 (SUTURE) ×2
SUT VIC AB 2-0 CT1 TAPERPNT 27 (SUTURE) ×1 IMPLANT
SYRINGE IRR TOOMEY STRL 70CC (SYRINGE) ×3 IMPLANT
TAPE MICROFOAM 4IN (TAPE) ×3 IMPLANT

## 2015-11-04 NOTE — Brief Op Note (Signed)
11/03/2015 - 11/04/2015  11:48 AM  PATIENT:  Jorge Moore  82 y.o. male  PRE-OPERATIVE DIAGNOSIS:  Right intertrochanteric femur fracture  POST-OPERATIVE DIAGNOSIS:  Same  PROCEDURE:   Open reduction and internal fixation of a right intertrochanteric femur fracture  SURGEON:  Surgeon(s) and Role:    * Dereck Leep, MD - Primary  ASSISTANTS: none   ANESTHESIA:   spinal  EBL:  Total I/O In: 717.5 [I.V.:717.5] Out: 225 [Urine:200; Blood:25]  BLOOD ADMINISTERED:none  DRAINS: none   LOCAL MEDICATIONS USED:  NONE  SPECIMEN:  No Specimen  DISPOSITION OF SPECIMEN:  N/A  COUNTS:  YES  TOURNIQUET:  * No tourniquets in log *  DICTATION: .Dragon Dictation  PLAN OF CARE: Admit to inpatient   PATIENT DISPOSITION:  PACU - hemodynamically stable.   Delay start of Pharmacological VTE agent (>24hrs) due to surgical blood loss or risk of bleeding: yes

## 2015-11-04 NOTE — OR Nursing (Signed)
Daughter was at patient's bedside the entire time patient was in SDS and helped with all questions. Pt. Voided sponts in urinal 75 mls

## 2015-11-04 NOTE — Progress Notes (Signed)
[facility]                                                                                                                                                                                            Patient Demographics   Jorge Moore, is a 82 y.o. male, DOB - 1924/01/27, [id number]  Admit date - 11/03/2015   Admitting Physician Vaughan Basta, MD  Outpatient Primary MD for the patient is FITZGERALD, DAVID, MD   LOS - 1  Subjective: Patient admitted with hip fracture his dad advanced dementia and unable to provide any review of systems     Review of Systems:   CONSTITUTIONAL: Unable to provide  Vitals:   Filed Vitals:   11/04/15 1215 11/04/15 1220 11/04/15 1235 11/04/15 1257  BP:  130/61 136/66 128/68  Pulse: 55 56 57 118  Temp:   98 F (36.7 C) 97.7 F (36.5 C)  TempSrc:    Oral  Resp: 9 14 14 20   Height:      Weight:      SpO2: 100% 99% 94% 90%    Wt Readings from Last 3 Encounters:  11/03/15 72.576 kg (160 lb)  09/19/15 72.576 kg (160 lb)  06/25/15 73.5 kg (162 lb 0.6 oz)     Intake/Output Summary (Last 24 hours) at 11/04/15 1257 Last data filed at 11/04/15 1235  Gross per 24 hour  Intake 1067.5 ml  Output    375 ml  Net  692.5 ml    Physical Exam:   GENERAL: Pleasant-appearing in no apparent distress.  HEAD, EYES, EARS, NOSE AND THROAT: Atraumatic, normocephalic. Extraocular muscles are intact. Pupils equal and reactive to light. Sclerae anicteric. No conjunctival injection. No oro-pharyngeal erythema.  NECK: Supple. There is no jugular venous distention. No bruits, no lymphadenopathy, no thyromegaly.  HEART: Regular rate and rhythm,. No murmurs, no rubs, no clicks.  LUNGS: Clear to auscultation bilaterally. No rales or rhonchi. No wheezes.  ABDOMEN: Soft, flat, nontender, nondistended. Has good bowel sounds. No hepatosplenomegaly appreciated.  EXTREMITIES: No evidence of any cyanosis,  clubbing, or peripheral edema.  +2 pedal and radial pulses bilaterally.  NEUROLOGIC: The patient is alert, not oriented to place person or time with no focal motor or sensory deficits appreciated bilaterally.  SKIN: Moist and warm with no rashes appreciated.  Psych: Not anxious, depressed LN: No inguinal LN enlargement    Antibiotics   Anti-infectives    Start     Dose/Rate Route Frequency Ordered Stop   11/03/15 2230  clindamycin (CLEOCIN) IVPB 600 mg    Comments:  SEND WITH THE PATIENT  TO THE OR. DO NOT ADMINISTER ON THE UNIT!   600 mg 100 mL/hr over 30 Minutes Intravenous To Surgery 11/03/15 2216 11/04/15 1007      Medications   Scheduled Meds: . [MAR Hold] acetaminophen  500 mg Oral TID  . [MAR Hold] aspirin  81 mg Oral Daily  . [MAR Hold] donepezil  10 mg Oral Daily  . [MAR Hold] FLUoxetine  10 mg Oral Daily  . [MAR Hold] heparin  5,000 Units Subcutaneous 3 times per day  . [MAR Hold] memantine  10 mg Oral Daily  . [MAR Hold] metoprolol tartrate  12.5 mg Oral BID  . [MAR Hold] potassium chloride  40 mEq Oral BID  . [MAR Hold] traMADol  50 mg Oral TID   Continuous Infusions: . sodium chloride 75 mL/hr at 11/03/15 2233   PRN Meds:.fentaNYL (SUBLIMAZE) injection, morphine injection, ondansetron (ZOFRAN) IV, oxyCODONE   Data Review:   Micro Results Recent Results (from the past 240 hour(s))  MRSA PCR Screening     Status: None   Collection Time: 11/03/15 10:10 PM  Result Value Ref Range Status   MRSA by PCR NEGATIVE NEGATIVE Final    Comment:        The GeneXpert MRSA Assay (FDA approved for NASAL specimens only), is one component of a comprehensive MRSA colonization surveillance program. It is not intended to diagnose MRSA infection nor to guide or monitor treatment for MRSA infections.     Radiology Reports Ct Head Wo Contrast  11/03/2015  CLINICAL DATA:  Head injury after fall at nursing facility. EXAM: CT HEAD WITHOUT CONTRAST CT CERVICAL SPINE  WITHOUT CONTRAST TECHNIQUE: Multidetector CT imaging of the head and cervical spine was performed following the standard protocol without intravenous contrast. Multiplanar CT image reconstructions of the cervical spine were also generated. COMPARISON:  CT scan of cervical spine of September 19, 2015. CT scan of June 25, 2015. FINDINGS: CT HEAD FINDINGS Bony calvarium appears intact. Mild diffuse cortical atrophy is noted. Moderate chronic ischemic white matter disease is noted. Probable old left cerebellar infarction. No mass effect or midline shift is noted. Ventricular size is within normal limits. There is no evidence of mass lesion, hemorrhage or acute infarction. Stable old lacunar infarction in right basal ganglia is noted. CT CERVICAL SPINE FINDINGS Reversal of normal lordosis of cervical spine is noted most likely degenerative in origin. Grade 1 anterolisthesis of C4-5 is noted secondary to posterior facet joint hypertrophy. Severe degenerative disc disease is noted at C5-6, C6-7 and C7-T1. Visualized lung apices are unremarkable. IMPRESSION: Mild diffuse cortical atrophy. Moderate chronic ischemic white matter disease. Stable probable old left cerebellar infarction. No acute intracranial abnormality seen. Severe multilevel degenerative disc disease. No acute abnormality seen in the cervical spine. Electronically Signed   By: Marijo Conception, M.D.   On: 11/03/2015 20:02   Ct Cervical Spine Wo Contrast  11/03/2015  CLINICAL DATA:  Head injury after fall at nursing facility. EXAM: CT HEAD WITHOUT CONTRAST CT CERVICAL SPINE WITHOUT CONTRAST TECHNIQUE: Multidetector CT imaging of the head and cervical spine was performed following the standard protocol without intravenous contrast. Multiplanar CT image reconstructions of the cervical spine were also generated. COMPARISON:  CT scan of cervical spine of September 19, 2015. CT scan of June 25, 2015. FINDINGS: CT HEAD FINDINGS Bony calvarium appears intact. Mild  diffuse cortical atrophy is noted. Moderate chronic ischemic white matter disease is noted. Probable old left cerebellar infarction. No mass effect or midline shift is noted.  Ventricular size is within normal limits. There is no evidence of mass lesion, hemorrhage or acute infarction. Stable old lacunar infarction in right basal ganglia is noted. CT CERVICAL SPINE FINDINGS Reversal of normal lordosis of cervical spine is noted most likely degenerative in origin. Grade 1 anterolisthesis of C4-5 is noted secondary to posterior facet joint hypertrophy. Severe degenerative disc disease is noted at C5-6, C6-7 and C7-T1. Visualized lung apices are unremarkable. IMPRESSION: Mild diffuse cortical atrophy. Moderate chronic ischemic white matter disease. Stable probable old left cerebellar infarction. No acute intracranial abnormality seen. Severe multilevel degenerative disc disease. No acute abnormality seen in the cervical spine. Electronically Signed   By: Marijo Conception, M.D.   On: 11/03/2015 20:02   Dg Chest Port 1 View  11/03/2015  CLINICAL DATA:  Preop chest exam.  Fall today. EXAM: PORTABLE CHEST 1 VIEW COMPARISON:  01/10/2015 FINDINGS: Normal heart size. Negative aortic and hilar contours. Right upper mediastinal convexity is from ectatic vessels based on 2016 chest CT. Blunting of the lateral left costophrenic sulcus is scarring based on 2016 chest CT. There is no edema, consolidation, effusion, or pneumothorax. No visible fracture. IMPRESSION: No active disease. Electronically Signed   By: Monte Fantasia M.D.   On: 11/03/2015 20:43   Dg Hip Operative Unilat W Or W/o Pelvis Right  11/04/2015  CLINICAL DATA:  82 year old male status post right hip fixation EXAM: OPERATIVE RIGHT HIP (WITH PELVIS IF PERFORMED) 6 VIEWS TECHNIQUE: Fluoroscopic spot image(s) were submitted for interpretation post-operatively. COMPARISON:  preoperative radiographs 11/03/2015 FINDINGS: Six intraoperative spot radiographs  demonstrate fixation of the right intertrochanteric fracture with a proximal age medullary nail including a single distal interlocking screw and a transfemoral neck gamma nail. No evidence of immediate hardware complication. Alignment appears anatomic. IMPRESSION: ORIF right intertrochanteric fracture as described above without evidence of acute hardware complication. Electronically Signed   By: Jacqulynn Cadet M.D.   On: 11/04/2015 12:21   Dg Hip Unilat With Pelvis 2-3 Views Right  11/03/2015  CLINICAL DATA:  Fall today with right hip pain, initial encounter EXAM: DG HIP (WITH OR WITHOUT PELVIS) 2-3V RIGHT COMPARISON:  None. FINDINGS: There is a subtle undisplaced fracture at the base of the right femoral neck involving the intratrochanteric region. Pelvic ring is intact. Postsurgical changes are noted. IMPRESSION: Undisplaced fracture at the base of the right femoral neck extending into the intertrochanteric region. Electronically Signed   By: Inez Catalina M.D.   On: 11/03/2015 20:08     CBC  Recent Labs Lab 11/03/15 1929 11/04/15 0524  WBC 7.3 11.8*  HGB 12.9* 12.4*  HCT 39.3* 37.4*  PLT 214 214  MCV 98.1 98.0  MCH 32.2 32.6  MCHC 32.9 33.3  RDW 13.5 13.2  LYMPHSABS 2.9  --   MONOABS 0.6  --   EOSABS 0.2  --   BASOSABS 0.0  --     Chemistries   Recent Labs Lab 11/03/15 1929 11/04/15 0524  NA 141 138  K 3.2* 4.1  CL 118* 104  CO2 19* 29  GLUCOSE 75 105*  BUN 11 13  CREATININE 0.73 0.85  CALCIUM 6.4* 8.6*  AST 14*  --   ALT 6*  --   ALKPHOS 42  --   BILITOT 0.8  --    ------------------------------------------------------------------------------------------------------------------ estimated creatinine clearance is 57 mL/min (by C-G formula based on Cr of 0.85). ------------------------------------------------------------------------------------------------------------------ No results for input(s): HGBA1C in the last 72  hours. ------------------------------------------------------------------------------------------------------------------ No results for input(s): CHOL, HDL, LDLCALC,  TRIG, CHOLHDL, LDLDIRECT in the last 72 hours. ------------------------------------------------------------------------------------------------------------------ No results for input(s): TSH, T4TOTAL, T3FREE, THYROIDAB in the last 72 hours.  Invalid input(s): FREET3 ------------------------------------------------------------------------------------------------------------------ No results for input(s): VITAMINB12, FOLATE, FERRITIN, TIBC, IRON, RETICCTPCT in the last 72 hours.  Coagulation profile  Recent Labs Lab 11/03/15 1929  INR 1.03    No results for input(s): DDIMER in the last 72 hours.  Cardiac Enzymes No results for input(s): CKMB, TROPONINI, MYOGLOBIN in the last 168 hours.  Invalid input(s): CK ------------------------------------------------------------------------------------------------------------------ Invalid input(s): [place]  Patient is 82 year old status post hip fracture 1. Right hip fracture  That is post repair- physical therapy  2. Hypertension Continue continue metoprolol 12.5 twice a day. Blood pressure stable.  3.Depression continue Prozac and   4. Dementia continue Aricept and Namenda  5. Misc: heparin for dvt proph      Code Status Orders        Start     Ordered   11/03/15 2222  Full code   Continuous     11/03/15 2221    Code Status History    Date Active Date Inactive Code Status Order ID Comments User Context   11/03/2015 10:16 PM 11/03/2015 10:22 PM Full Code [id number]  Dereck Leep, MD ED           Consults  ortho  DVT Prophylaxis  heprin  Lab Results  Component Value Date   PLT 214 11/04/2015     Time Spent in minutes   70min   Dustin Flock M.D on 11/04/2015 at 12:57 PM  Between 7am to 6pm - Pager -  [phone]  After 6pm go to [email or web address] - password [email or web address] [facility]   Office  [phone]

## 2015-11-04 NOTE — Clinical Social Work Placement (Signed)
   CLINICAL SOCIAL WORK PLACEMENT  NOTE  Date:  11/04/2015  Patient Details  Name: Jorge Moore MRN: [id number] Date of Birth: September 25, 1923  Clinical Social Work is seeking post-discharge placement for this patient at the [place] level of care (*CSW will initial, date and re-position this form in  chart as items are completed):  Yes   Patient/family provided with [facility] Work Department's list of facilities offering this level of care within the geographic area requested by the patient (or if unable, by the patient's family).  Yes   Patient/family informed of their freedom to choose among providers that offer the needed level of care, that participate in Medicare, Medicaid or managed care program needed by the patient, have an available bed and are willing to accept the patient.  Yes   Patient/family informed of [facility]'s ownership interest in [facility] and [facility], as well as of the fact that they are under no obligation to receive care at these facilities.  PASRR submitted to EDS on       PASRR number received on       Existing PASRR number confirmed on 11/04/15     FL2 transmitted to all facilities in geographic area requested by pt/family on 11/04/15     FL2 transmitted to all facilities within larger geographic area on       Patient informed that his/her managed care company has contracts with or will negotiate with certain facilities, including the following:        Yes   Patient/family informed of bed offers received.  Patient chooses bed at  [facility] )     Physician recommends and patient chooses bed at      Patient to be transferred to   on  .  Patient to be transferred to facility by       Patient family notified on   of transfer.  Name of family member notified:        PHYSICIAN       Additional Comment:    _______________________________________________ Loralyn Freshwater, LCSW 11/04/2015,  11:25 AM

## 2015-11-04 NOTE — Progress Notes (Signed)
Rapid response called for this patient.  Primary nurse notified MD.  I updated dr Rudene Christians.  No orders given from him

## 2015-11-04 NOTE — Consult Note (Signed)
@ENCDATE @ 10:17 AM   Jorge Moore [birth year] [id number]  Referring provider: Dr. Skip Estimable  Chief Complaint  Patient presents with  . Fall    HPI: The patient is a 82 year old gentleman presents in the operating room for a right hip fracture with difficult Foley catheter placement. The patient under spinal anesthesia and sedation so no further history is able to obtain at this time.   PMH: Past Medical History  Diagnosis Date  . Depressed   . Dementia   . GERD (gastroesophageal reflux disease)   . Prostate cancer ([place])   . Atrial fibrillation ([place])   . Ataxia   . PE (pulmonary embolism)     Surgical History: History reviewed. No pertinent past surgical history.  Home Medications:    Medication List    ASK your doctor about these medications        acetaminophen 325 MG tablet  Commonly known as:  TYLENOL  Take 650 mg by mouth every 4 (four) hours as needed.     acetaminophen 500 MG tablet  Commonly known as:  TYLENOL  Take 500 mg by mouth 3 (three) times daily. Along with tramadol     aspirin 81 MG chewable tablet  Chew 81 mg by mouth daily.     donepezil 10 MG tablet  Commonly known as:  ARICEPT  Take 10 mg by mouth daily.     FLUoxetine 10 MG capsule  Commonly known as:  PROZAC  Take 10 mg by mouth daily.     Melatonin 3 MG Tabs  Take 1 tablet by mouth at bedtime as needed.     memantine 10 MG tablet  Commonly known as:  NAMENDA  Take 10 mg by mouth daily.     metoprolol tartrate 25 MG tablet  Commonly known as:  LOPRESSOR  Take 12.5 mg by mouth 2 (two) times daily.     oyster calcium 500 MG Tabs tablet  Take 500 mg of elemental calcium by mouth 2 (two) times daily.     traMADol 50 MG tablet  Commonly known as:  ULTRAM  Take 50 mg by mouth 3 (three) times daily.        Allergies:  Allergies  Allergen Reactions  . Penicillins     Reaction: unknown     Family History: Family History  Problem Relation Age of Onset  .  Colon cancer Mother   . Diabetes Father   . Diabetes Brother     Social History:  reports that he has quit smoking. He does not have any smokeless tobacco history on file. He reports that he does not drink alcohol. His drug history is not on file.  ROS: Unable to obtain as the patient is under anesthesia                                        Physical Exam: BP 125/61 mmHg  Pulse 69  Temp(Src) 97 F (36.1 C) (Tympanic)  Resp 16  Ht 5\' 8"  (1.727 m)  Wt 160 lb (72.576 kg)  BMI 24.33 kg/m2  SpO2 95%  Constitutional:  Under anesthesia HEENT: [place] AT, moist mucus membranes.  Trachea midline, no masses. Cardiovascular: No clubbing, cyanosis, or edema. Respiratory: Normal respiratory effort, no increased work of breathing. GI: Abdomen is soft, nontender, nondistended, no abdominal masses GU: No CVA tenderness. Normal phallus uncircumcised. Testicles and equal bilaterally. Skin: No  rashes, bruises or suspicious lesions.t.  Laboratory Data: Lab Results  Component Value Date   WBC 11.8* 11/04/2015   HGB 12.4* 11/04/2015   HCT 37.4* 11/04/2015   MCV 98.0 11/04/2015   PLT 214 11/04/2015    Lab Results  Component Value Date   CREATININE 0.85 11/04/2015    Lab Results  Component Value Date   PSA  11/15/2010    ========== TEST NAME ==========  ========= RESULTS =========  = REFERENCE RANGE =  PROSTATE-SPECIFIC AG.  PSA, Serum (Serial Monitor) Prostate Specific Ag, Serum     [   <0.1 ng/mL           ]           0.0-4.0 Roche ECLIA methodology.                                                                      . According to the American Urological Association, Serum PSA should decrease and remain at undetectable levels after radical prostatectomy. The AUA defines biochemical recurrence as an initial PSA value 0.2 ng/mL or greater followed by a subsequent confirmatory PSA value 0.2 ng/mL or greater. Values obtained with different assay methods or kits  cannot be used interchangeably. Results cannot be interpreted as absolute evidence of the presence or absence of malignant disease.               [facility]            No: [id number]           [address] [place], [place] [address]           Lindon Romp, MD         [phone]   Result(s) reported on 16 Nov 2010 at 11:49AM.     No results found for: TESTOSTERONE  No results found for: HGBA1C  Urinalysis    Component Value Date/Time   COLORURINE YELLOW* 11/03/2015 2027   COLORURINE Yellow 01/10/2015 1842   APPEARANCEUR CLEAR* 11/03/2015 2027   APPEARANCEUR Clear 01/10/2015 1842   LABSPEC 1.020 11/03/2015 2027   LABSPEC 1.032 01/10/2015 1842   PHURINE 6.0 11/03/2015 2027   PHURINE 5.0 01/10/2015 1842   GLUCOSEU NEGATIVE 11/03/2015 2027   GLUCOSEU Negative 01/10/2015 1842   HGBUR NEGATIVE 11/03/2015 2027   HGBUR 2+ 01/10/2015 1842   BILIRUBINUR NEGATIVE 11/03/2015 2027   BILIRUBINUR Negative 01/10/2015 1842   KETONESUR NEGATIVE 11/03/2015 2027   KETONESUR Trace 01/10/2015 1842   PROTEINUR NEGATIVE 11/03/2015 2027   PROTEINUR Negative 01/10/2015 1842   NITRITE NEGATIVE 11/03/2015 2027   NITRITE Negative 01/10/2015 1842   LEUKOCYTESUR NEGATIVE 11/03/2015 2027   LEUKOCYTESUR Negative 01/10/2015 1842    The patient was prepped and draped in sterile fashion. A 16 French Foley catheter was placed in the patient's bladder per urethra. There was mild tightness in the proximal urethra suggesting minor stricture. This was easily navigated passed with return of clear yellow urine. 10 cc was placed in the Foley balloon.  Assessment & Plan:    1. Difficult Foley -Continue Foley catheter dependent drainage until Foley is no longer needed by primary team. Okay to discontinue Foley when no longer medically necessary. -Please call with questions  Nickie Retort, MD  [facility] [address], [place], [place]  [address] [phone]

## 2015-11-04 NOTE — Care Management (Signed)
Multiple CM consults received and will follow however the CSW will assist with discharge either back to ALF or SNF- surgery today. ALF uses [facility] for home health.

## 2015-11-04 NOTE — Progress Notes (Signed)
Paged Dr. Posey Pronto regarding declining blood pressure.  Pending instructions.

## 2015-11-04 NOTE — Anesthesia Procedure Notes (Signed)
Spinal  End time: 11/04/2015 10:01 AM Staffing Resident/CRNA: Doreen Salvage Performed by: resident/CRNA  Preanesthetic Checklist Completed: patient identified, site marked, surgical consent, pre-op evaluation, timeout performed, IV checked, risks and benefits discussed and monitors and equipment checked Spinal Block Patient position: left lateral decubitus Prep: Betadine Patient monitoring: heart rate, cardiac monitor, continuous pulse ox and blood pressure Approach: midline Location: L4-5 Injection technique: single-shot Needle Needle type: [place]acre and Introducer  Needle gauge: 25 G Needle length: 9 cm

## 2015-11-04 NOTE — Significant Event (Signed)
RT's Andrey Cota and Ardelle Balls responded to rapid response on patient.  Upon arrival, patient O2 sat on room air 93%, RT's opted to leave room, letting RN Maudie Mercury know to call us if we are needed in any way.

## 2015-11-04 NOTE — Evaluation (Signed)
Clinical/Bedside Swallow Evaluation Patient Details  Name: Jorge Moore MRN: [id number] Date of Birth: 1923/10/24  Today's Date: 11/04/2015 Time: SLP Start Time (ACUTE ONLY): 75 SLP Stop Time (ACUTE ONLY): 1650 SLP Time Calculation (min) (ACUTE ONLY): 60 min  Past Medical History:  Past Medical History  Diagnosis Date  . Depressed   . Dementia   . GERD (gastroesophageal reflux disease)   . Prostate cancer ([place])   . Atrial fibrillation ([place])   . Ataxia   . PE (pulmonary embolism)    Past Surgical History:  Past Surgical History  Procedure Laterality Date  . Intramedullary (im) nail intertrochanteric Right 11/04/2015    Procedure: INTRAMEDULLARY (IM) NAIL INTERTROCHANTRIC;  Surgeon: Dereck Leep, MD;  Location: [facility] ORS;  Service: Orthopedics;  Laterality: Right;   HPI:  Pt is a 82 y.o. male with a known history of depression, ataxia, GERD, dementia, prostate cancer, atrial fibrillation, previously on Coumadin but not taking for almost one year; lives in the [place] and able to walk with a walker. Pt is eating a "regular" diet but it's "soft foods" d/t loose dentures. Of note, family has tried to use an adhesive to stabilize the dentures, however, this is not working any longer d/t the shrinking of the gums, esp. the bottom gums. Family makes sure the foods are soft and moist in order for easier eating but stated he eats "fried chicken and pinto beans". Family denies any trouble swallowing or s/s of aspiration w/ meals.    Assessment / Plan / Recommendation Clinical Impression  Pt appeared to adequately tolerate trials of thin liquids and purees/soft solids (broken down well) w/ no overt s/s of aspiration and no decline in respiratory status/vocal quality during/post po trials. Pt exhibited adequate oral phase control and management of boluses w/ appropriate oral clearing b/t trials. Pt did require the foods to be moist and broken down for easier mastication sec. to his loose denture  status. The family stated he eats "regular" foods that are "soft" but seem to appreciate the even softer foods like purees (mashed potatoes, ice cream). Rec. a modified diet of Dys. 3 w/ thin liquids w/ strict aspiration precautions including NO straws and assistance w/ feeding at meals; rec. meds in Puree. ST will f/u toleration of diet and education w/ family while admitted.     Aspiration Risk  Mild aspiration risk (reduced w/ aspiration precautions)    Diet Recommendation  Dys. 3 w/ thin liquids; strict aspiration precautions; reflux precautions; feeding assistance at meals.  Medication Administration: Whole meds with puree    Other  Recommendations Recommended Consults:  (Dietician as needed) Oral Care Recommendations: Oral care BID;Staff/trained caregiver to provide oral care   Follow up Recommendations   (TBD)    Frequency and Duration min 2x/week  2 weeks       Prognosis Prognosis for Safe Diet Advancement: Good Barriers to Reach Goals: Cognitive deficits      Swallow Study   General Date of Onset: 11/03/15 HPI: Pt is a 82 y.o. male with a known history of depression, ataxia, GERD, dementia, prostate cancer, atrial fibrillation, previously on Coumadin but not taking for almost one year; lives in the [place] and able to walk with a walker. Pt is eating a "regular" diet but it's "soft foods" d/t loose dentures. Of note, family has tried to use an adhesive to stabilize the dentures, however, this is not working any longer d/t the shrinking of the gums, esp. the bottom gums. Family makes sure  the foods are soft and moist in order for easier eating but stated he eats "fried chicken and pinto beans". Family denies any trouble swallowing or s/s of aspiration w/ meals.  Type of Study: Bedside Swallow Evaluation Previous Swallow Assessment: none Diet Prior to this Study: Regular;Thin liquids (soft foods per family but "regular, not pureed") Temperature Spikes Noted: No (wbc elevated  11.8) Respiratory Status: Nasal cannula History of Recent Intubation: No Behavior/Cognition: Alert;Cooperative;Pleasant mood;Confused;Requires cueing;Distractible (baseline Dementia) Oral Cavity Assessment: Dry Oral Care Completed by SLP: Yes Oral Cavity - Dentition: Dentures, top;Dentures, bottom (min. loose) Vision: Functional for self-feeding Self-Feeding Abilities: Able to feed self;Needs assist;Needs set up;Total assist Patient Positioning: Upright in bed Baseline Vocal Quality: Low vocal intensity Volitional Cough: Strong Volitional Swallow: Able to elicit    Oral/Motor/Sensory Function Overall Oral Motor/Sensory Function: Within functional limits   Ice Chips Ice chips: Within functional limits Presentation: Spoon (fed; 3 trials)   Thin Liquid Thin Liquid: Within functional limits Presentation: Cup;Self Fed;Straw (assisted; 9 trials via cup, 2 trials via straw) Other Comments: normally does not use straws at home per Dtr.; Dtr wishes to use cup at this time    Nectar Thick Nectar Thick Liquid: Not tested   Honey Thick Honey Thick Liquid: Not tested   Puree Puree: Within functional limits Presentation: Spoon (fed; 10+ trials)   Solid   GO   Solid: Within functional limits (softened solids - moist; 6 trials) Presentation: Spoon Other Comments: min. increased oral phase time but cleared appropropriately and timely       Orinda Kenner, MS, CCC-SLP  Watson,Katherine 11/04/2015,4:59 PM

## 2015-11-04 NOTE — NC FL2 (Signed)
[place] LEVEL OF CARE SCREENING TOOL     IDENTIFICATION  Patient Name: Jorge Moore Birthdate: 01-21-1924 Sex: male Admission Date (Current Location): 11/03/2015  [place] and [place] Number:  Engineering geologist and Address:  [facility], [address], [place], [place] [address]      Provider Number: [id number]  Attending Physician Name and Address:  Dustin Flock, MD  Relative Name and Phone Number:       Current Level of Care: Hospital Recommended Level of Care: [place] Prior Approval Number:    Date Approved/Denied:   PASRR Number:  ([id number])  Discharge Plan: SNF    Current Diagnoses: Patient Active Problem List   Diagnosis Date Noted  . Hip fracture, right ([place]) 11/03/2015  . Hip fracture ([place]) 11/03/2015    Orientation RESPIRATION BLADDER Height & Weight     Self, Time, Situation, Place  Normal Continent Weight: 160 lb (72.576 kg) Height:  5\' 8"  (172.7 cm)  BEHAVIORAL SYMPTOMS/MOOD NEUROLOGICAL BOWEL NUTRITION STATUS   (none )  (none ) Continent Diet (Diet: NPO for surgery )  AMBULATORY STATUS COMMUNICATION OF NEEDS Skin   Extensive Assist Verbally Surgical wounds (Incision: Right Hip. )                       Personal Care Assistance Level of Assistance  Bathing, Feeding, Dressing Bathing Assistance: Limited assistance Feeding assistance: Independent Dressing Assistance: Limited assistance     Functional Limitations Info  Sight, Hearing, Speech Sight Info: Adequate Hearing Info: Adequate Speech Info: Adequate    SPECIAL CARE FACTORS FREQUENCY  PT (By licensed PT), OT (By licensed OT)     PT Frequency:  (5) OT Frequency:  (5)            Contractures      Additional Factors Info  Code Status, Allergies Code Status Info:  (Full Code. ) Allergies Info:  (Penicillins)           Current Medications (11/04/2015):  This is the current hospital active medication  list Current Facility-Administered Medications  Medication Dose Route Frequency Provider Last Rate Last Dose  . 0.9 %  sodium chloride infusion   Intravenous Continuous Dereck Leep, MD 75 mL/hr at 11/03/15 2233    . [MAR Hold] acetaminophen (TYLENOL) tablet 500 mg  500 mg Oral TID Vaughan Basta, MD   500 mg at 11/03/15 2251  . [MAR Hold] aspirin chewable tablet 81 mg  81 mg Oral Daily Vaughan Basta, MD      . [place] Hold] donepezil (ARICEPT) tablet 10 mg  10 mg Oral Daily Vaughan Basta, MD      . [place] Hold] FLUoxetine (PROZAC) capsule 10 mg  10 mg Oral Daily Vaughan Basta, MD      . [place] Hold] heparin injection 5,000 Units  5,000 Units Subcutaneous 3 times per day Vaughan Basta, MD   Stopped at 11/04/15 0600  . [MAR Hold] memantine (NAMENDA) tablet 10 mg  10 mg Oral Daily Vaughan Basta, MD      . [place] Hold] metoprolol tartrate (LOPRESSOR) tablet 12.5 mg  12.5 mg Oral BID Vaughan Basta, MD   12.5 mg at 11/04/15 0807  . morphine 2 MG/ML injection 2-4 mg  2-4 mg Intravenous Q4H PRN Dereck Leep, MD      . neomycin-polymyxin B (NEOSPORIN) irrigation solution    PRN Dereck Leep, MD   4 mL at 11/04/15 1052  . oxyCODONE (  Oxy IR/ROXICODONE) immediate release tablet 5-10 mg  5-10 mg Oral Q4H PRN Dereck Leep, MD      . [place] Hold] potassium chloride (KLOR-CON) packet 40 mEq  40 mEq Oral BID Vaughan Basta, MD   40 mEq at 11/03/15 2157  . [MAR Hold] traMADol (ULTRAM) tablet 50 mg  50 mg Oral TID Vaughan Basta, MD   50 mg at 11/03/15 2251   Facility-Administered Medications Ordered in Other Encounters  Medication Dose Route Frequency Provider Last Rate Last Dose  . ePHEDrine injection   Intravenous Anesthesia Intra-op Lance Muss, CRNA   5 mg at 11/04/15 1045  . fentaNYL (SUBLIMAZE) injection    Anesthesia Intra-op Lance Muss, CRNA   25 mcg at 11/04/15 0945  . propofol (DIPRIVAN) 500 MG/50ML infusion    Continuous PRN Lance Muss,  CRNA 8.7 mL/hr at 11/04/15 1045 20 mcg/kg/min at 11/04/15 1045     Discharge Medications: Please see discharge summary for a list of discharge medications.  Relevant Imaging Results:  Relevant Lab Results:   Additional Information  (SSN: [id number])  Loralyn Freshwater, LCSW

## 2015-11-04 NOTE — Transfer of Care (Signed)
Immediate Anesthesia Transfer of Care Note  Patient: Jorge Moore  Procedure(s) Performed: Procedure(s): INTRAMEDULLARY (IM) NAIL INTERTROCHANTRIC (Right)  Patient Location: PACU  Anesthesia Type:Spinal  Level of Consciousness: responds to stimulation  Airway & Oxygen Therapy: Patient Spontanous Breathing and Patient connected to face mask oxygen  Post-op Assessment: Report given to RN and Post -op Vital signs reviewed and stable  Post vital signs: Reviewed and stable  Last Vitals:  Filed Vitals:   11/04/15 0753 11/04/15 0856  BP: 129/68 125/61  Pulse: 84 69  Temp: 36.8 C 36.1 C  Resp: 16 16    Complications: No apparent anesthesia complications

## 2015-11-04 NOTE — Progress Notes (Signed)
Paged [facility] X 2, no call back.  Called RRT.  Opened fluids to run.  All vitals stable except continued hypotension.  Dressing clean and dry, minimal blood loss during surgery and no visible blood loss.

## 2015-11-04 NOTE — Care Management (Signed)
RNCM consult received and will continue to follow along with CSW. Patient is from ALF at the [facility].

## 2015-11-04 NOTE — Plan of Care (Signed)
Problem: SLP Dysphagia Goals Goal: Misc Dysphagia Goal Pt will safely tolerate po diet of least restrictive consistency w/ no overt s/s of aspiration noted by Staff/pt/family x3 sessions.    

## 2015-11-04 NOTE — Anesthesia Preprocedure Evaluation (Addendum)
Anesthesia Evaluation  Patient identified by MRN, date of birth, ID band Patient awake    Reviewed: Allergy & Precautions, H&P , NPO status , Patient's Chart, lab work & pertinent test results, reviewed documented beta blocker date and time   History of Anesthesia Complications Negative for: history of anesthetic complications  Airway Mallampati: III  TM Distance: >3 FB Neck ROM: full    Dental no notable dental hx. (+) Edentulous Upper, Edentulous Lower   Pulmonary neg pulmonary ROS, former smoker,    Pulmonary exam normal breath sounds clear to auscultation       Cardiovascular Exercise Tolerance: Good (-) hypertension(-) angina(-) CAD, (-) Past MI, (-) Cardiac Stents and (-) CABG Normal cardiovascular exam+ dysrhythmias Atrial Fibrillation (-) Valvular Problems/Murmurs Rhythm:regular Rate:Normal     Neuro/Psych neg Seizures PSYCHIATRIC DISORDERS (Depression and dementia) CVA    GI/Hepatic Neg liver ROS, GERD  ,  Endo/Other  negative endocrine ROS  Renal/GU negative Renal ROS  negative genitourinary   Musculoskeletal   Abdominal   Peds  Hematology negative hematology ROS (+)   Anesthesia Other Findings Past Medical History:   Depressed                                                    Dementia                                                     GERD (gastroesophageal reflux disease)                       Prostate cancer ([facility])                                        Atrial fibrillation ([facility])                                    Ataxia                                                       PE (pulmonary embolism)                                      Reproductive/Obstetrics negative OB ROS                            Anesthesia Physical Anesthesia Plan  ASA: III  Anesthesia Plan: Spinal   Post-op Pain Management:    Induction:   Airway Management Planned:   Additional Equipment:    Intra-op Plan:   Post-operative Plan:   Informed Consent: I have reviewed the patients History and Physical, chart, labs and discussed the procedure including the risks, benefits and alternatives for the proposed anesthesia with the patient or  authorized representative who has indicated his/her understanding and acceptance.   Dental Advisory Given  Plan Discussed with: Anesthesiologist, CRNA and Surgeon  Anesthesia Plan Comments:        Anesthesia Quick Evaluation

## 2015-11-04 NOTE — Progress Notes (Signed)
Dr. Anselm Jungling to order 1 liter bolus and lab workup.  Will continue to monitor patient closely and call if assistance is needed.

## 2015-11-04 NOTE — Op Note (Addendum)
OPERATIVE NOTE  DATE OF SURGERY: 11/04/2015  PATIENT NAME:  Jorge Moore   DOB: Jan 20, 1924  MRN: [id number]  PRE-OPERATIVE DIAGNOSIS: Right intertrochanteric femur fracture  POST-OPERATIVE DIAGNOSIS:  Same  PROCEDURE: Open reduction and internal fixation of a right intertrochanteric femur fracture   SURGEON:  Marciano Sequin. M.D.  ANESTHESIA: spinal  ESTIMATED BLOOD LOSS: 25 mL  FLUIDS REPLACED: 700 mL of crystalloid  DRAINS: None  IMPLANTS UTILIZED: Synthes 11 mm/130 trochanteric fixation nail, A999333 mm helical blade, 40 mm x 5.0 mm locking screw  INDICATIONS FOR SURGERY: Jorge Moore is a 82 y.o. year old male who fell and sustained a displaced right intertrochanteric femur fracture on 11/03/2015. After discussion of the risks and benefits of surgical intervention, the patient expressed understanding of the risks benefits and agree with plans for open reduction and internal fixation.   The risks, benefits, and alternatives were discussed at length including but not limited to the risks of infection, bleeding, nerve injury, stiffness, blood clots, the need for revision surgery, limb length inequality, cardiopulmonary complications, among others, and they were willing to proceed.  PROCEDURE IN DETAIL: The patient was brought into the operating room and, after adequate spinal anesthesia was achieved, patient was placed on the fracture table. All bony prominences were well-padded. The right lower extremity was placed in traction and a provisional reduction was performed and verified using the C-arm. The patient's right hip and leg were cleaned and prepped with alcohol and DuraPrep and draped in the usual sterile fashion. A "timeout" was performed as per usual protocol. A lateral incision was made extended from the proximal portion of the greater trochanter proximally. The fascia was incised in line with the skin incision and the fibers of the hip abductors were split in line. The  tip of the greater trochanter was palpated and a distally threaded guide pin was inserted into the tip of the greater trochanter and advanced into the medullary canal. Position was confirmed in both AP and lateral planes using the C-arm. A pilot hole was enlarged using a step drill. A Synthes 11 mm/130 trochanteric fixation nail was advanced over the guidepin and position confirmed using the C-arm. A second stab incision was made and the tissue protector was inserted through the outrigger device and advanced to the lateral cortex of the femur. A threaded screw guide pin was inserted into the femoral neck and head and position was again confirmed in both AP and lateral planes. Vision was were obtained and it was felt that a A999333 mm helical blade was appropriate. The cortex was reamed and then a cannulated reamer was advanced over the guidepin to the appropriate depth. A A999333 mm helical blade was then advanced over the guidepin and impacted into place. Good position was noted in multiple planes using the C-arm. The locking sleeve was engaged. Finally, a third stab incision was made and the tissue protector was inserted through the outrigger device and advanced the lateral cortex of the femur for placement of the distal locking screw. A 40 mm x 5.0 mm locking screw was then inserted. The outrigger device was removed. The hip was visualized in all planes using the C-arm with good reduction appreciated and good position of the hardware noted.  The wound was irrigated with copious amounts of normal saline with antibiotic solution and suctioned dry. Good hemostasis was appreciated. The fascia was reapproximated using interrupted sutures of #1 Vicryl. Subcutaneous tissue was approximated layers using first #0 Vicryl followed #2-0  Vicryl. The skin was closed with skin staples. A sterile dressing was applied.  The patient tolerated the procedure well and was transported to the recovery room in stable condition.   Marciano Sequin., M.D.

## 2015-11-04 NOTE — Progress Notes (Signed)
Rapid Response Event Note  Overview:      Initial Focused Assessment:   Interventions:   Event Summary: Rapid response paged at [facility]. Pt was sleeping but awakens to voice. Hx of dementia, orientation in question. Vital signs WDL except bp map in mid 50's. See flowsheets for full vital signs.  Vachhani spoke with primary rn, order cbc, chest xray, 2 one liter bolus. Will await results from lab and will assess if pt needs bed in icu after iv fluid bolus given.             Tomasa Rand

## 2015-11-04 NOTE — Clinical Social Work Note (Signed)
Clinical Social Work Assessment  Patient Details  Name: Jorge Moore MRN: [id number] Date of Birth: 08/15/1924  Date of referral:  11/04/15               Reason for consult:  Facility Placement, Other (Comment Required) (From [facility] )                Permission sought to share information with:  Chartered certified accountant granted to share information::  Yes, Verbal Permission Granted  Name::      IT sales professional::   [place]   Relationship::     Contact Information:     Housing/Transportation Living arrangements for the past 2 months:  [place] of Information:  Adult Children Patient Interpreter Needed:  None Criminal Activity/Legal Involvement Pertinent to Current Situation/Hospitalization:  No - Comment as needed Significant Relationships:  Adult Children Lives with:  Facility Resident Do you feel safe going back to the place where you live?   (Unable to assess. ) Need for family participation in patient care:  Yes (Comment)  Care giving concerns: Patient is a resident at [facility].    Social Worker assessment / plan:  Holiday representative (CSW) received SNF consult. Patient had surgery today for a hip fracture. CSW contacted [facility] and spoke with [employer]. Per Levada Dy patient has been a resident since September 2016. Levada Dy also reported that patient uses a walker and a wheel chair for ambulation. CSW made Med Tech aware that patient is having surgery today. CSW contacted patient's daughter Judeth Porch. Per daughter patient is private pay at [facility] and has been there for 6 months. Daughter reported that patient was at [place] for 2 years before moving to [place]. CSW explained to daughter that due to patient's injury he will likely need to go to rehab at [facility] before returning to [facility]. Daughter is agreeable to SNF search and prefers [facility]. CSW explained that Medicare will pay  for days 1-20 at SNF if patient has a 3 night inpatient qualifying stay. CSW explained that patient will have to participate in PT. Daughter verbalized her understanding.   CSW presented bed offers and daughter chose [facility]. Plan is for patient to D/C to [facility] on Monday 11/07/15. [name] admissions coordinator at [facility] is aware of above. CSW will continue to follow and assist as needed.   Employment status:  Retired Forensic scientist:  Medicare PT Recommendations:  Not assessed at this time [place] / Referral to community resources:  [place]  Patient/Family's Response to care:  Patient's daughter Judeth Porch is agreeable for patient to go to [facility] for rehab.   Patient/Family's Understanding of and Emotional Response to Diagnosis, Current Treatment, and Prognosis: Patient's daughter was pleasant throughout assessment.   Emotional Assessment Appearance:    Attitude/Demeanor/Rapport:  Unable to Assess Affect (typically observed):  Unable to Assess Orientation:  Oriented to Self, Oriented to Place, Oriented to  Time, Fluctuating Orientation (Suspected and/or reported Sundowners) Alcohol / Substance use:  Not Applicable Psych involvement (Current and /or in the community):  No (Comment)  Discharge Needs  Concerns to be addressed:  Discharge Planning Concerns Readmission within the last 30 days:  No Current discharge risk:  Dependent with Mobility, Cognitively Impaired Barriers to Discharge:  Continued Medical Work up   Loralyn Freshwater, LCSW 11/04/2015, 1:51 PM

## 2015-11-05 LAB — BASIC METABOLIC PANEL
Anion gap: 3 — ABNORMAL LOW (ref 5–15)
BUN: 13 mg/dL (ref 6–20)
CALCIUM: 7.9 mg/dL — AB (ref 8.9–10.3)
CO2: 25 mmol/L (ref 22–32)
CREATININE: 0.77 mg/dL (ref 0.61–1.24)
Chloride: 109 mmol/L (ref 101–111)
GLUCOSE: 97 mg/dL (ref 65–99)
Potassium: 4 mmol/L (ref 3.5–5.1)
Sodium: 137 mmol/L (ref 135–145)

## 2015-11-05 LAB — CBC
HCT: 27.7 % — ABNORMAL LOW (ref 40.0–52.0)
Hemoglobin: 9.2 g/dL — ABNORMAL LOW (ref 13.0–18.0)
MCH: 32.4 pg (ref 26.0–34.0)
MCHC: 33.1 g/dL (ref 32.0–36.0)
MCV: 97.8 fL (ref 80.0–100.0)
Platelets: 155 K/uL (ref 150–440)
RBC: 2.83 MIL/uL — ABNORMAL LOW (ref 4.40–5.90)
RDW: 13.2 % (ref 11.5–14.5)
WBC: 9.2 K/uL (ref 3.8–10.6)

## 2015-11-05 LAB — URINE CULTURE: SPECIAL REQUESTS: NORMAL

## 2015-11-05 NOTE — Anesthesia Postprocedure Evaluation (Signed)
Anesthesia Post Note  Patient: Jorge Moore  Procedure(s) Performed: Procedure(s) (LRB): INTRAMEDULLARY (IM) NAIL INTERTROCHANTRIC (Right)  Patient location during evaluation: PACU Anesthesia Type: Spinal Level of consciousness: oriented and awake and alert Pain management: pain level controlled Vital Signs Assessment: post-procedure vital signs reviewed and stable Respiratory status: spontaneous breathing, respiratory function stable and patient connected to nasal cannula oxygen Cardiovascular status: blood pressure returned to baseline and stable Postop Assessment: no headache and no backache Anesthetic complications: no    Last Vitals:  Filed Vitals:   11/05/15 0411 11/05/15 0748  BP: 100/50 110/53  Pulse:  89  Temp:  36.8 C  Resp:  16    Last Pain:  Filed Vitals:   11/05/15 0748  PainSc: Asleep                 Molli Barrows

## 2015-11-05 NOTE — Progress Notes (Signed)
Spoke with Reche Dixon regarding  Foley catheter. Sherren Mocha stated to leave in foley until urology decides it needs to be removed.

## 2015-11-05 NOTE — Progress Notes (Signed)
  Subjective: 1 Day Post-Op Procedure(s) (LRB): INTRAMEDULLARY (IM) NAIL INTERTROCHANTRIC (Right) Patient reports pain as moderate, per nursing.   Patient seen in rounds with Dr. Rudene Christians. Patient is well, and has had no acute complaints or problems.  Minimal communication with the patient. Plan is to go Skilled nursing facility after hospital stay. Negative for chest pain and shortness of breath Fever: no Gastrointestinal: Negative for nausea and vomiting  Objective: Vital signs in last 24 hours: Temp:  [97 F (36.1 C)-99 F (37.2 C)] 98.4 F (36.9 C) (02/18 0334) Pulse Rate:  [52-118] 79 (02/18 0334) Resp:  [9-20] 16 (02/18 0334) BP: (76-136)/(39-68) 100/50 mmHg (02/18 0411) SpO2:  [90 %-100 %] 100 % (02/18 0334)  Intake/Output from previous day:  Intake/Output Summary (Last 24 hours) at 11/05/15 0653 Last data filed at 11/05/15 0447  Gross per 24 hour  Intake 3648.75 ml  Output   1075 ml  Net 2573.75 ml    Intake/Output this shift: Total I/O In: 2461.3 [I.V.:1161.3; IV Piggyback:1300] Out: 800 [Urine:800]  Labs:  Recent Labs  11/03/15 1929 11/04/15 0524 11/04/15 1901 11/05/15 0403  HGB 12.9* 12.4* 9.8* 9.2*    Recent Labs  11/04/15 1901 11/05/15 0403  WBC 9.3 9.2  RBC 3.04* 2.83*  HCT 29.7* 27.7*  PLT 170 155    Recent Labs  11/04/15 0524 11/05/15 0403  NA 138 137  K 4.1 4.0  CL 104 109  CO2 29 25  BUN 13 13  CREATININE 0.85 0.77  GLUCOSE 105* 97  CALCIUM 8.6* 7.9*    Recent Labs  11/03/15 1929  INR 1.03     EXAM General - Patient is Minimal communication with limited reaction to questions. Extremity - Neurologically intact Neurovascular intact Intact pulses distally Compartment soft Dressing/Incision - clean, dry, no drainage Motor Function - intact, moving foot and toes well on exam.   Past Medical History  Diagnosis Date  . Depressed   . Dementia   . GERD (gastroesophageal reflux disease)   . Prostate cancer ([place])   .  Atrial fibrillation ([place])   . Ataxia   . PE (pulmonary embolism)     Assessment/Plan: 1 Day Post-Op Procedure(s) (LRB): INTRAMEDULLARY (IM) NAIL INTERTROCHANTRIC (Right) Principal Problem:   Hip fracture, right (HCC) Active Problems:   Hip fracture (HCC)  Estimated body mass index is 24.33 kg/(m^2) as calculated from the following:   Height as of this encounter: 5\' 8"  (1.727 m).   Weight as of this encounter: 72.576 kg (160 lb). Discharge to SNF when cleared medically. The patient will be on Lovenox 40 mg subcutaneous daily for 14 days after discharge.  DVT Prophylaxis - Lovenox, Foot Pumps and TED hose Weight-Bearing as tolerated to right leg  Reche Dixon, PA-C Orthopaedic Surgery 11/05/2015, 6:53 AM

## 2015-11-05 NOTE — Evaluation (Signed)
Occupational Therapy Evaluation Patient Details Name: Jorge Moore MRN: [id number] DOB: 08-Mar-1924 Today's Date: 11/05/2015    History of Present Illness Pt is an 82 y/o male who is a resident at the [facility].  He had a fall and suffered a R hip fracture needing an ORIF.  He has baseline dementia, apparently he was walking with FWW prior to this incident.   Clinical Impression   Pt present with R ORIF - pt baseline Dementia - can follow one step commands - unknown ADL's prior status - pt need more assistance with mobility in ADL's for LB dressing/toiletting and transfer in bathroom. Pt can benefit from OT services      Follow Up Recommendations  SNF    Equipment Recommendations  3 in 1 bedside comode    Recommendations for Other Services       Precautions / Restrictions Precautions Precautions: Fall Restrictions Weight Bearing Restrictions: Yes RLE Weight Bearing: Weight bearing as tolerated      Mobility Equipment used: Rolling walker (2 wheeled) Transfers: Sit to/from Stand Sit to Stand: Mod assist;Max assist         General transfer comment: Pt is able to rise to standing with heavy assist and a lot of cuing.  Pt confused and obviously uncomfortable with R WBing.    Balance                                            ADL Overall ADL's : Needs assistance/impaired                     Lower Body Dressing: Maximal assistance   Toilet Transfer: +2 for physical assistance;Moderate assistance                   Vision     Perception     Praxis      Pertinent Vitals/Pain Pain Assessment:  (indicates minimal pain @ rest, greatly increases w/ activity)     Hand Dominance     Extremity/Trunk Assessment Upper Extremity Assessment Upper Extremity Assessment: Generalized weakness (appears relatively functional, unable to formally )   Lower Extremity Assessment Lower Extremity Assessment: RLE deficits/detail RLE Deficits /  Details:  He is able to display some minimal strength/movement but between pain and confusion is quite limited.  AA/PROM with hesitancy and discomfort.        Communication Communication Communication: No difficulties   Cognition Arousal/Alertness: Awake/alert Behavior During Therapy: Restless Overall Cognitive Status: History of cognitive impairments - at baseline                     General Comments       Exercises Exercises: General Lower Extremity     Shoulder Instructions      Home Living Family/patient expects to be discharged to:: Skilled nursing facility                                        Prior Functioning/Environment          Comments: When question about help wth ADL's pt report " he do not know" apparently he was able to do some walking with FWW at baseline.    OT Diagnosis: Acute pain;Cognitive deficits;Generalized weakness   OT Problem List: Decreased strength;Decreased  activity tolerance;Decreased safety awareness;Impaired balance (sitting and/or standing)   OT Treatment/Interventions: Self-care/ADL training;Neuromuscular education;DME and/or AE instruction;Patient/family education;Balance training    OT Goals(Current goals can be found in the care plan section) Acute Rehab OT Goals Patient Stated Goal: unable  OT Frequency: Min 1X/week   Barriers to D/C:            Co-evaluation              End of Session Equipment Utilized During Treatment: Gait belt  Activity Tolerance:   Patient left: in chair;with call bell/phone within reach;with chair alarm set   Time: [id number] OT Time Calculation (min): 31 min Charges:  OT Evaluation $OT Eval Moderate Complexity: 1 Procedure G-Codes:    Brittanya Winburn OTR/L, CLT  11/05/2015, 12:36 PM

## 2015-11-05 NOTE — Plan of Care (Signed)
Problem: Pain Managment: Goal: General experience of comfort will improve Outcome: Progressing Pt pain control with iv tylenol this shift.  Problem: Physical Regulation: Goal: Ability to maintain clinical measurements within normal limits will improve Outcome: Progressing Pt blood pressure starting to improve  Problem: Fluid Volume: Goal: Ability to maintain a balanced intake and output will improve Outcome: Progressing Foley patent with good urinary output.

## 2015-11-05 NOTE — Evaluation (Signed)
Physical Therapy Evaluation Patient Details Name: Jorge Moore MRN: [id number] DOB: 1924-05-16 Today's Date: 11/05/2015   History of Present Illness  Pt is an 82 y/o male who is a resident at the [facility].  He had a fall and suffered a R hip fracture needing an ORIF.  He has baseline dementia, apparently he was walking with FWW prior to this incident.  Clinical Impression  Pt with general confusion t/o eval and has a difficult time understanding why his hip hurts, etc.  He is willing to participate with ~10 minutes of exercises apart from the exam with a lot of cuing and encouragement but generally is quite limited with what he is able to do.  Pt does poorly with standing/"ambulation" and clearly is confused and uncomfortable during these activities.    Follow Up Recommendations SNF    Equipment Recommendations       Recommendations for Other Services       Precautions / Restrictions Precautions Precautions: Fall Restrictions Weight Bearing Restrictions: Yes RLE Weight Bearing: Weight bearing as tolerated      Mobility  Bed Mobility Overal bed mobility: Needs Assistance Bed Mobility: Supine to Sit     Supine to sit: Mod assist     General bed mobility comments: Pt confused and is therefore limited with how much he can help, he is also hesitant secondary to pain. Ultimately he did show some effort getting to EOB but was again very limited.   Transfers Overall transfer level: Needs assistance Equipment used: Rolling walker (2 wheeled) Transfers: Sit to/from Stand Sit to Stand: Mod assist;Max assist         General transfer comment: Pt is able to rise to standing with heavy assist and a lot of cuing.  Pt confused and obviously uncomfortable with R WBing.  Ambulation/Gait Ambulation/Gait assistance: Max assist;Total assist Ambulation Distance (Feet): 3 Feet Assistive device: Rolling walker (2 wheeled)       General Gait Details: Pt unable to really take steps or  "ambulate" he did need heavy assist with even trying to do minimal shuffle/shift.  Pt very hesitant with WBing and generally does not do well with going just a few feet to get to recliner.   Stairs            Wheelchair Mobility    Modified Rankin (Stroke Patients Only)       Balance                                             Pertinent Vitals/Pain Pain Assessment:  (indicates minimal pain @ rest, greatly increases w/ activity)    Home Living Family/patient expects to be discharged to:: Skilled nursing facility                      Prior Function           Comments: Pt unable to answer, apparently he was able to do some walking with FWW at baseline.     Hand Dominance        Extremity/Trunk Assessment   Upper Extremity Assessment: Generalized weakness (appears relatively functional, unable to formally )           Lower Extremity Assessment: RLE deficits/detail RLE Deficits / Details:  He is able to display some minimal strength/movement but between pain and confusion is quite limited.  AA/PROM with  hesitancy and discomfort.        Communication   Communication: No difficulties (pt confused and unable to answer most questions appropriatel)  Cognition Arousal/Alertness: Awake/alert (confused) Behavior During Therapy: Restless Overall Cognitive Status: History of cognitive impairments - at baseline                      General Comments      Exercises General Exercises - Lower Extremity Ankle Circles/Pumps: AROM;10 reps Quad Sets: Strengthening;10 reps Gluteal Sets: Strengthening;10 reps Heel Slides: 10 reps;AAROM Hip ABduction/ADduction: AAROM;10 reps      Assessment/Plan    PT Assessment Patient needs continued PT services  PT Diagnosis Difficulty walking;Generalized weakness   PT Problem List Decreased strength;Decreased range of motion;Decreased balance;Decreased activity tolerance;Decreased  mobility;Decreased coordination;Decreased safety awareness;Decreased knowledge of use of DME;Decreased cognition;Pain  PT Treatment Interventions DME instruction;Gait training;Stair training;Functional mobility training;Therapeutic activities;Therapeutic exercise;Balance training;Neuromuscular re-education;Patient/family education   PT Goals (Current goals can be found in the Care Plan section) Acute Rehab PT Goals Patient Stated Goal: unable PT Goal Formulation: Patient unable to participate in goal setting Time For Goal Achievement: 11/19/15 Potential to Achieve Goals: Fair    Frequency BID   Barriers to discharge        Co-evaluation               End of Session Equipment Utilized During Treatment: Gait belt Activity Tolerance: Patient limited by pain;Treatment limited secondary to agitation Patient left: with call bell/phone within reach;with chair alarm set           Time: [id number] PT Time Calculation (min) (ACUTE ONLY): 24 min   Charges:   PT Evaluation $PT Eval Moderate Complexity: 1 Procedure PT Treatments $Therapeutic Exercise: 8-22 mins   PT G Codes:       Jorge Moore, PT, DPT [phone]  Kreg Shropshire 11/05/2015, 12:27 PM

## 2015-11-05 NOTE — Progress Notes (Signed)
Physical Therapy Treatment Patient Details Name: Jorge Moore MRN: [id number] DOB: [birth year] Today's Date: 11/05/2015    History of Present Illness Pt is an 82 y/o male who is a resident at the [facility].  He had a fall and suffered a R hip fracture needing an ORIF.  He has baseline dementia, apparently he was walking with FWW prior to this incident.    PT Comments    Pt continues to be confused as to why his hip hurts and struggles to meaningfully follow along mobility acts.  He is able to do some exercises with PROM cues and much encouragement.  He very much struggles with standing/WBing/weight shifts in standing and his confusion and lack of situational understanding make mobility/transfers difficult.  Follow Up Recommendations  SNF     Equipment Recommendations       Recommendations for Other Services       Precautions / Restrictions Precautions Precautions: Fall Restrictions Weight Bearing Restrictions: Yes RLE Weight Bearing: Weight bearing as tolerated    Mobility  Bed Mobility Overal bed mobility: Needs Assistance Bed Mobility: Sit to Supine     Supine to sit: Mod assist Sit to supine: Max assist   General bed mobility comments: Pt confused, unable to assist, c/o pain during the transition  Transfers Overall transfer level: Needs assistance Equipment used: Rolling walker (2 wheeled) Transfers: Sit to/from Stand Sit to Stand: Mod assist         General transfer comment: Pt is able to assist with getting to standing, needs a lot of directa nd verbal cuing to use walker correcttly, get to full upright, etc but did show good effort in getting to standing.  Ambulation/Gait Ambulation/Gait assistance: Max assist Ambulation Distance (Feet): 3 Feet Assistive device: Rolling walker (2 wheeled)       General Gait Details: Again pt struggles to really shift weight onto the R and c/o pain t/o the effort.  He does show a few instances where he did move feet more  but continues to ultimately shuffle feet with poor tolerance, safety or balance.   Stairs            Wheelchair Mobility    Modified Rankin (Stroke Patients Only)       Balance                                    Cognition Arousal/Alertness: Lethargic Behavior During Therapy: Restless Overall Cognitive Status: History of cognitive impairments - at baseline                      Exercises General Exercises - Lower Extremity Ankle Circles/Pumps: AROM;10 reps Quad Sets: Strengthening;10 reps Gluteal Sets: Strengthening;10 reps Heel Slides: 10 reps;AAROM Hip ABduction/ADduction: AAROM;10 reps    General Comments        Pertinent Vitals/Pain Pain Assessment:  (indicates minimal pain @ rest, greatly increases w/ activity)    Home Living Family/patient expects to be discharged to:: Skilled nursing facility                    Prior Function        Comments: When question about help wth ADL's pt report " he do not know" apparently he was able to do some walking with FWW at baseline.   PT Goals (current goals can now be found in the care plan section) Acute Rehab PT Goals Patient  Stated Goal: unable PT Goal Formulation: Patient unable to participate in goal setting Time For Goal Achievement: 11/19/15 Potential to Achieve Goals: Fair    Frequency  BID    PT Plan Current plan remains appropriate    Co-evaluation             End of Session Equipment Utilized During Treatment: Gait belt Activity Tolerance: Patient limited by pain;Treatment limited secondary to agitation Patient left: with bed alarm set;with call bell/phone within reach     Time: 1351-1416 PT Time Calculation (min) (ACUTE ONLY): 25 min  Charges:  $Therapeutic Exercise: 8-22 mins $Therapeutic Activity: 8-22 mins                    G Codes:     Wayne Both, PT, DPT [phone]  Jorge Moore 11/05/2015, 3:37 PM

## 2015-11-05 NOTE — Progress Notes (Signed)
[place] at [facility]                                                                                                                                                                                            Patient Demographics   Jorge Moore, is a 82 y.o. male, DOB - 1924/01/19, [id number]  Admit date - 11/03/2015   Admitting Physician Vaughan Basta, MD  Outpatient Primary MD for the patient is Ola Spurr, DAVID, MD   LOS - 2  Subjective; had right hip surgery yesterday. Rapid response was called last night because of hypotension. Received a liter of fluid and blood pressure is better today. No fever. Labs look great. No drop in hemoglobin. Patient is demented and unable to give any history.    Review of Systems:   CONSTITUTIONAL: Unable to provide  Vitals:   Filed Vitals:   11/04/15 2329 11/05/15 0334 11/05/15 0411 11/05/15 0748  BP: 105/50 97/45 100/50 110/53  Pulse: 72 79  89  Temp: 98.4 F (36.9 C) 98.4 F (36.9 C)  98.3 F (36.8 C)  TempSrc:  Oral  Oral  Resp:  16  16  Height:      Weight:      SpO2: 98% 100%  100%    Wt Readings from Last 3 Encounters:  11/03/15 72.576 kg (160 lb)  09/19/15 72.576 kg (160 lb)  06/25/15 73.5 kg (162 lb 0.6 oz)     Intake/Output Summary (Last 24 hours) at 11/05/15 0828 Last data filed at 11/05/15 0751  Gross per 24 hour  Intake 2931.25 ml  Output   1450 ml  Net 1481.25 ml    Physical Exam:   GENERAL: Pleasant-appearing in no apparent distress.  HEAD, EYES, EARS, NOSE AND THROAT: Atraumatic, normocephalic. Extraocular muscles are intact. Pupils equal and reactive to light. Sclerae anicteric. No conjunctival injection. No oro-pharyngeal erythema.  NECK: Supple. There is no jugular venous distention. No bruits, no lymphadenopathy, no thyromegaly.  HEART: Regular rate and rhythm,. No murmurs, no rubs, no clicks.  LUNGS: Clear to auscultation bilaterally. No rales or  rhonchi. No wheezes.  ABDOMEN: Soft, flat, nontender, nondistended. Has good bowel sounds. No hepatosplenomegaly appreciated.  EXTREMITIES: No evidence of any cyanosis, clubbing, or peripheral edema.  +2 pedal and radial pulses bilaterally.  NEUROLOGIC: The patient is alert, not oriented to place person or time with no focal motor or sensory deficits appreciated bilaterally.  SKIN: Moist and warm with no rashes appreciated.  Psych: Not anxious, depressed LN: No inguinal LN enlargement    Antibiotics   Anti-infectives    Start  Dose/Rate Route Frequency Ordered Stop   11/04/15 1600  clindamycin (CLEOCIN) IVPB 600 mg     600 mg 100 mL/hr over 30 Minutes Intravenous Every 6 hours 11/04/15 1316 11/04/15 2130   11/03/15 2230  clindamycin (CLEOCIN) IVPB 600 mg    Comments:  SEND WITH THE PATIENT TO THE OR. DO NOT ADMINISTER ON THE UNIT!   600 mg 100 mL/hr over 30 Minutes Intravenous To Surgery 11/03/15 2216 11/04/15 1007      Medications   Scheduled Meds: . acetaminophen  1,000 mg Intravenous 4 times per day  . donepezil  10 mg Oral Daily  . enoxaparin (LOVENOX) injection  40 mg Subcutaneous Q24H  . ferrous sulfate  325 mg Oral BID WC  . FLUoxetine  10 mg Oral Daily  . memantine  10 mg Oral Daily  . metoprolol tartrate  12.5 mg Oral BID  . pantoprazole  40 mg Oral BID  . potassium chloride  40 mEq Oral BID  . senna-docusate  1 tablet Oral BID   Continuous Infusions: . sodium chloride 75 mL/hr at 11/04/15 1945   PRN Meds:.acetaminophen **OR** acetaminophen, bisacodyl, magnesium hydroxide, menthol-cetylpyridinium **OR** phenol, metoCLOPramide **OR** metoCLOPramide (REGLAN) injection, morphine injection, ondansetron **OR** ondansetron (ZOFRAN) IV, oxyCODONE, sodium phosphate, traMADol   Data Review:   Micro Results Recent Results (from the past 240 hour(s))  MRSA PCR Screening     Status: None   Collection Time: 11/03/15 10:10 PM  Result Value Ref Range Status   MRSA by  PCR NEGATIVE NEGATIVE Final    Comment:        The GeneXpert MRSA Assay (FDA approved for NASAL specimens only), is one component of a comprehensive MRSA colonization surveillance program. It is not intended to diagnose MRSA infection nor to guide or monitor treatment for MRSA infections.     Radiology Reports Dg Chest 1 View  11/04/2015  CLINICAL DATA:  Hypotension. EXAM: CHEST 1 VIEW COMPARISON:  11/03/2015 and 01/10/2015 FINDINGS: Patient is rotated to the left. The lungs are adequately inflated with mild chronic left basilar opacification unchanged to slightly worse. Cardiomediastinal silhouette and remainder of the exam is unchanged. IMPRESSION: Chronic opacification left base with suggestion of mild interval worsening. Cannot exclude atelectasis versus developing small effusion or early infection. Electronically Signed   By: Marin Olp M.D.   On: 11/04/2015 19:29   Ct Head Wo Contrast  11/03/2015  CLINICAL DATA:  Head injury after fall at nursing facility. EXAM: CT HEAD WITHOUT CONTRAST CT CERVICAL SPINE WITHOUT CONTRAST TECHNIQUE: Multidetector CT imaging of the head and cervical spine was performed following the standard protocol without intravenous contrast. Multiplanar CT image reconstructions of the cervical spine were also generated. COMPARISON:  CT scan of cervical spine of September 19, 2015. CT scan of June 25, 2015. FINDINGS: CT HEAD FINDINGS Bony calvarium appears intact. Mild diffuse cortical atrophy is noted. Moderate chronic ischemic white matter disease is noted. Probable old left cerebellar infarction. No mass effect or midline shift is noted. Ventricular size is within normal limits. There is no evidence of mass lesion, hemorrhage or acute infarction. Stable old lacunar infarction in right basal ganglia is noted. CT CERVICAL SPINE FINDINGS Reversal of normal lordosis of cervical spine is noted most likely degenerative in origin. Grade 1 anterolisthesis of C4-5 is noted  secondary to posterior facet joint hypertrophy. Severe degenerative disc disease is noted at C5-6, C6-7 and C7-T1. Visualized lung apices are unremarkable. IMPRESSION: Mild diffuse cortical atrophy. Moderate chronic ischemic white matter disease.  Stable probable old left cerebellar infarction. No acute intracranial abnormality seen. Severe multilevel degenerative disc disease. No acute abnormality seen in the cervical spine. Electronically Signed   By: Marijo Conception, M.D.   On: 11/03/2015 20:02   Ct Cervical Spine Wo Contrast  11/03/2015  CLINICAL DATA:  Head injury after fall at nursing facility. EXAM: CT HEAD WITHOUT CONTRAST CT CERVICAL SPINE WITHOUT CONTRAST TECHNIQUE: Multidetector CT imaging of the head and cervical spine was performed following the standard protocol without intravenous contrast. Multiplanar CT image reconstructions of the cervical spine were also generated. COMPARISON:  CT scan of cervical spine of September 19, 2015. CT scan of June 25, 2015. FINDINGS: CT HEAD FINDINGS Bony calvarium appears intact. Mild diffuse cortical atrophy is noted. Moderate chronic ischemic white matter disease is noted. Probable old left cerebellar infarction. No mass effect or midline shift is noted. Ventricular size is within normal limits. There is no evidence of mass lesion, hemorrhage or acute infarction. Stable old lacunar infarction in right basal ganglia is noted. CT CERVICAL SPINE FINDINGS Reversal of normal lordosis of cervical spine is noted most likely degenerative in origin. Grade 1 anterolisthesis of C4-5 is noted secondary to posterior facet joint hypertrophy. Severe degenerative disc disease is noted at C5-6, C6-7 and C7-T1. Visualized lung apices are unremarkable. IMPRESSION: Mild diffuse cortical atrophy. Moderate chronic ischemic white matter disease. Stable probable old left cerebellar infarction. No acute intracranial abnormality seen. Severe multilevel degenerative disc disease. No acute  abnormality seen in the cervical spine. Electronically Signed   By: Marijo Conception, M.D.   On: 11/03/2015 20:02   Dg Chest Port 1 View  11/03/2015  CLINICAL DATA:  Preop chest exam.  Fall today. EXAM: PORTABLE CHEST 1 VIEW COMPARISON:  01/10/2015 FINDINGS: Normal heart size. Negative aortic and hilar contours. Right upper mediastinal convexity is from ectatic vessels based on 2016 chest CT. Blunting of the lateral left costophrenic sulcus is scarring based on 2016 chest CT. There is no edema, consolidation, effusion, or pneumothorax. No visible fracture. IMPRESSION: No active disease. Electronically Signed   By: Monte Fantasia M.D.   On: 11/03/2015 20:43   Dg Hip Operative Unilat W Or W/o Pelvis Right  11/04/2015  CLINICAL DATA:  82 year old male status post right hip fixation EXAM: OPERATIVE RIGHT HIP (WITH PELVIS IF PERFORMED) 6 VIEWS TECHNIQUE: Fluoroscopic spot image(s) were submitted for interpretation post-operatively. COMPARISON:  preoperative radiographs 11/03/2015 FINDINGS: Six intraoperative spot radiographs demonstrate fixation of the right intertrochanteric fracture with a proximal age medullary nail including a single distal interlocking screw and a transfemoral neck gamma nail. No evidence of immediate hardware complication. Alignment appears anatomic. IMPRESSION: ORIF right intertrochanteric fracture as described above without evidence of acute hardware complication. Electronically Signed   By: Jacqulynn Cadet M.D.   On: 11/04/2015 12:21   Dg Hip Unilat With Pelvis 2-3 Views Right  11/03/2015  CLINICAL DATA:  Fall today with right hip pain, initial encounter EXAM: DG HIP (WITH OR WITHOUT PELVIS) 2-3V RIGHT COMPARISON:  None. FINDINGS: There is a subtle undisplaced fracture at the base of the right femoral neck involving the intratrochanteric region. Pelvic ring is intact. Postsurgical changes are noted. IMPRESSION: Undisplaced fracture at the base of the right femoral neck extending into  the intertrochanteric region. Electronically Signed   By: Inez Catalina M.D.   On: 11/03/2015 20:08     CBC  Recent Labs Lab 11/03/15 1929 11/04/15 0524 11/04/15 1901 11/05/15 0403  WBC 7.3 11.8* 9.3 9.2  HGB 12.9* 12.4* 9.8* 9.2*  HCT 39.3* 37.4* 29.7* 27.7*  PLT 214 214 170 155  MCV 98.1 98.0 97.7 97.8  MCH 32.2 32.6 32.3 32.4  MCHC 32.9 33.3 33.1 33.1  RDW 13.5 13.2 13.2 13.2  LYMPHSABS 2.9  --   --   --   MONOABS 0.6  --   --   --   EOSABS 0.2  --   --   --   BASOSABS 0.0  --   --   --     Chemistries   Recent Labs Lab 11/03/15 1929 11/04/15 0524 11/05/15 0403  NA 141 138 137  K 3.2* 4.1 4.0  CL 118* 104 109  CO2 19* 29 25  GLUCOSE 75 105* 97  BUN 11 13 13   CREATININE 0.73 0.85 0.77  CALCIUM 6.4* 8.6* 7.9*  AST 14*  --   --   ALT 6*  --   --   ALKPHOS 42  --   --   BILITOT 0.8  --   --    ------------------------------------------------------------------------------------------------------------------ estimated creatinine clearance is 60.6 mL/min (by C-G formula based on Cr of 0.77). ------------------------------------------------------------------------------------------------------------------ No results for input(s): HGBA1C in the last 72 hours. ------------------------------------------------------------------------------------------------------------------ No results for input(s): CHOL, HDL, LDLCALC, TRIG, CHOLHDL, LDLDIRECT in the last 72 hours. ------------------------------------------------------------------------------------------------------------------ No results for input(s): TSH, T4TOTAL, T3FREE, THYROIDAB in the last 72 hours.  Invalid input(s): FREET3 ------------------------------------------------------------------------------------------------------------------ No results for input(s): VITAMINB12, FOLATE, FERRITIN, TIBC, IRON, RETICCTPCT in the last 72 hours.  Coagulation profile  Recent Labs Lab 11/03/15 1929  INR 1.03    No  results for input(s): DDIMER in the last 72 hours.  Cardiac Enzymes No results for input(s): CKMB, TROPONINI, MYOGLOBIN in the last 168 hours.  Invalid input(s): CK ------------------------------------------------------------------------------------------------------------------ Invalid input(s): [place]  Patient is 82 year old status post hip fracture 1. Right hip fracture  Status post open reduction and internal fixation. Continue Lovenox for DVT prophylaxis, physical therapy as tolerated. 2. Hypertension; hypotension yesterday received fluid bolus. Blood pressure is still soft so hold the metoprolol today.   3.Depression continue Prozac and   4. Dementia continue Aricept and Namenda  5. Misc: heparin for dvt proph #6 dysphagia: Continue dysphagia 3 diet with nectar thick liquids,      Code Status Orders        Start     Ordered   11/03/15 2222  Full code   Continuous     11/03/15 2221    Code Status History    Date Active Date Inactive Code Status Order ID Comments User Context   11/03/2015 10:16 PM 11/03/2015 10:22 PM Full Code [id number]  Dereck Leep, MD ED           Consults  ortho  DVT Prophylaxis  heprin  Lab Results  Component Value Date   PLT 155 11/05/2015     Time Spent in minutes   13  min   Jaylanni Eltringham M.D on 11/05/2015 at 8:28 AM  Between 7am to 6pm - Pager - [phone]  After 6pm go to [email or web address] - password [email or web address] [facility]   Office  [phone]

## 2015-11-06 ENCOUNTER — Inpatient Hospital Stay: Payer: Medicare Other

## 2015-11-06 LAB — BASIC METABOLIC PANEL
ANION GAP: 5 (ref 5–15)
BUN: 11 mg/dL (ref 6–20)
CALCIUM: 8.2 mg/dL — AB (ref 8.9–10.3)
CO2: 26 mmol/L (ref 22–32)
Chloride: 108 mmol/L (ref 101–111)
Creatinine, Ser: 0.8 mg/dL (ref 0.61–1.24)
Glucose, Bld: 109 mg/dL — ABNORMAL HIGH (ref 65–99)
Potassium: 4 mmol/L (ref 3.5–5.1)
Sodium: 139 mmol/L (ref 135–145)

## 2015-11-06 LAB — CBC
HEMATOCRIT: 27.4 % — AB (ref 40.0–52.0)
Hemoglobin: 9.1 g/dL — ABNORMAL LOW (ref 13.0–18.0)
MCH: 33 pg (ref 26.0–34.0)
MCHC: 33.3 g/dL (ref 32.0–36.0)
MCV: 99 fL (ref 80.0–100.0)
PLATELETS: 150 10*3/uL (ref 150–440)
RBC: 2.77 MIL/uL — ABNORMAL LOW (ref 4.40–5.90)
RDW: 13.5 % (ref 11.5–14.5)
WBC: 14.3 10*3/uL — AB (ref 3.8–10.6)

## 2015-11-06 MED ORDER — OXYCODONE HCL 5 MG PO TABS: 5.0000 mg | ORAL_TABLET | ORAL | Status: DC | PRN

## 2015-11-06 MED ORDER — ENOXAPARIN SODIUM 40 MG/0.4ML ~~LOC~~ SOLN: 40.0000 mg | SUBCUTANEOUS | Status: DC

## 2015-11-06 MED ORDER — TRAMADOL HCL 50 MG PO TABS: 50.0000 mg | ORAL_TABLET | ORAL | Status: DC | PRN

## 2015-11-06 MED ORDER — METOPROLOL TARTRATE 25 MG PO TABS
12.5000 mg | ORAL_TABLET | Freq: Two times a day (BID) | ORAL | Status: DC
  Administered 2015-11-06 – 2015-11-07 (×2): 12.5 mg via ORAL
  Filled 2015-11-06 (×3): qty 1

## 2015-11-06 NOTE — Progress Notes (Signed)
Physical Therapy Treatment Patient Details Name: Jorge Moore MRN: [id number] DOB: Mar 10, 1924 Today's Date: 11/06/2015    History of Present Illness Pt is an 82 y/o male who is a resident at the [facility].  He had a fall and suffered a R hip fracture needing an ORIF.  He has baseline dementia, apparently he was walking with FWW prior to this incident.    PT Comments    Pt again quite limited today. He has pain with all exercises and appears to do less AROM needing almost all AAROM acts on the R today.  Pt confused and needs to be reminded that he broke his hip, had surgery and is in the hospital many times today though he would ask again just a few minutes later.  Pt is very weak, pain limited and confused making PT difficult.  Follow Up Recommendations  SNF     Equipment Recommendations       Recommendations for Other Services       Precautions / Restrictions Precautions Precautions: Fall Restrictions RLE Weight Bearing: Weight bearing as tolerated    Mobility  Bed Mobility Overal bed mobility: Needs Assistance Bed Mobility: Supine to Sit     Supine to sit: Max assist     General bed mobility comments: Pt confused, calling out in pain during transition.  He is able to maintain sitting balance with CGA after much assist to get him set up.  Transfers Overall transfer level: Needs assistance Equipment used: Rolling walker (2 wheeled) Transfers: Sit to/from Stand Sit to Stand: Max assist         General transfer comment: Pt able to help getting to standing less today. He is very pain limited and clearly is uncomfortable with getting to standing/WBing.   Ambulation/Gait Ambulation/Gait assistance: Total assist Ambulation Distance (Feet): 3 Feet Assistive device: Rolling walker (2 wheeled)       General Gait Details: Pt again unable to truly take any steps or actually ambulate.  He is not able to meaningfully move either foot despite heavy assist and much cuing.  Pt  very limited with standing/WBing/ambualtion tasks.   Stairs            Wheelchair Mobility    Modified Rankin (Stroke Patients Only)       Balance                                    Cognition Arousal/Alertness: Lethargic Behavior During Therapy: Restless Overall Cognitive Status: History of cognitive impairments - at baseline                      Exercises General Exercises - Lower Extremity Ankle Circles/Pumps: AAROM;AROM;10 reps Quad Sets: Strengthening;10 reps Gluteal Sets: Strengthening;10 reps Long Arc Quad: AAROM;10 reps Heel Slides: 10 reps;AAROM Hip ABduction/ADduction: AAROM;10 reps    General Comments        Pertinent Vitals/Pain      Home Living                      Prior Function            PT Goals (current goals can now be found in the care plan section) Progress towards PT goals: Not progressing toward goals - comment (pt's mental status and pain are big limiters)    Frequency  BID    PT Plan Current plan remains appropriate  Co-evaluation             End of Session Equipment Utilized During Treatment: Gait belt Activity Tolerance: Patient limited by pain;Treatment limited secondary to agitation Patient left: with chair alarm set;with call bell/phone within reach     Time: 0857-0924 PT Time Calculation (min) (ACUTE ONLY): 27 min  Charges:  $Therapeutic Exercise: 8-22 mins $Therapeutic Activity: 8-22 mins                    G Codes:     Jorge Moore, PT, DPT [phone]  Jorge Moore 11/06/2015, 3:41 PM

## 2015-11-06 NOTE — Progress Notes (Signed)
[place] at [facility]                                                                                                                                                                                            Patient Demographics   Jorge Moore, is a 82 y.o. male, DOB - 01-28-1924, [id number]  Admit date - 11/03/2015   Admitting Physician Vaughan Basta, MD  Outpatient Primary MD for the patient is Ola Spurr, DAVID, MD   LOS - 3  Subjective;low  grade temperature yesterday. Patient is completely demented so unable to give any history. Not hypoxic. No further fever. Blood pressure is better. Slightly tachycardic.  Review of Systems:   CONSTITUTIONAL: Unable to provide  Vitals:   Filed Vitals:   11/05/15 1558 11/05/15 2007 11/06/15 0347 11/06/15 0738  BP: 110/68 128/91 115/47 127/54  Pulse: 79 91 97 105  Temp: 98.8 F (37.1 C) 100.4 F (38 C) 98.9 F (37.2 C) 98.8 F (37.1 C)  TempSrc: Oral Oral Oral Oral  Resp: 16 18  16   Height:      Weight:      SpO2: 100% 97% 93% 95%    Wt Readings from Last 3 Encounters:  11/03/15 72.576 kg (160 lb)  09/19/15 72.576 kg (160 lb)  06/25/15 73.5 kg (162 lb 0.6 oz)     Intake/Output Summary (Last 24 hours) at 11/06/15 0757 Last data filed at 11/06/15 0742  Gross per 24 hour  Intake    120 ml  Output   1100 ml  Net   -980 ml    Physical Exam:   GENERAL: Pleasant-appearing in no apparent distress.  HEAD, EYES, EARS, NOSE AND THROAT: Atraumatic, normocephalic. Extraocular muscles are intact. Pupils equal and reactive to light. Sclerae anicteric. No conjunctival injection. No oro-pharyngeal erythema.  NECK: Supple. There is no jugular venous distention. No bruits, no lymphadenopathy, no thyromegaly.  HEART: Regular rate and rhythm,. No murmurs, no rubs, no clicks.  LUNGS: Clear to auscultation bilaterally. No rales or rhonchi. No wheezes.  ABDOMEN: Soft, flat, nontender,  nondistended. Has good bowel sounds. No hepatosplenomegaly appreciated.  EXTREMITIES: No evidence of any cyanosis, clubbing, or peripheral edema.  +2 pedal and radial pulses bilaterally.  NEUROLOGIC: The patient is alert, not oriented to place person or time with no focal motor or sensory deficits appreciated bilaterally.  SKIN: Moist and warm with no rashes appreciated.  Psych: Not anxious, depressed LN: No inguinal LN enlargement    Antibiotics   Anti-infectives    Start     Dose/Rate Route Frequency Ordered Stop   11/04/15 1600  clindamycin (CLEOCIN) IVPB 600 mg     600 mg 100 mL/hr over 30 Minutes Intravenous Every 6 hours 11/04/15 1316 11/04/15 2130   11/03/15 2230  clindamycin (CLEOCIN) IVPB 600 mg    Comments:  SEND WITH THE PATIENT TO THE OR. DO NOT ADMINISTER ON THE UNIT!   600 mg 100 mL/hr over 30 Minutes Intravenous To Surgery 11/03/15 2216 11/04/15 1007      Medications   Scheduled Meds: . donepezil  10 mg Oral Daily  . enoxaparin (LOVENOX) injection  40 mg Subcutaneous Q24H  . ferrous sulfate  325 mg Oral BID WC  . FLUoxetine  10 mg Oral Daily  . memantine  10 mg Oral Daily  . pantoprazole  40 mg Oral BID  . potassium chloride  40 mEq Oral BID  . senna-docusate  1 tablet Oral BID   Continuous Infusions: . sodium chloride 75 mL/hr at 11/06/15 0451   PRN Meds:.acetaminophen **OR** acetaminophen, bisacodyl, magnesium hydroxide, menthol-cetylpyridinium **OR** phenol, metoCLOPramide **OR** metoCLOPramide (REGLAN) injection, morphine injection, ondansetron **OR** ondansetron (ZOFRAN) IV, oxyCODONE, sodium phosphate, traMADol   Data Review:   Micro Results Recent Results (from the past 240 hour(s))  Urine culture     Status: None   Collection Time: 11/03/15  8:27 PM  Result Value Ref Range Status   Specimen Description URINE, RANDOM  Final   Special Requests Normal  Final   Culture MULTIPLE SPECIES PRESENT, SUGGEST RECOLLECTION  Final   Report Status  11/05/2015 FINAL  Final  MRSA PCR Screening     Status: None   Collection Time: 11/03/15 10:10 PM  Result Value Ref Range Status   MRSA by PCR NEGATIVE NEGATIVE Final    Comment:        The GeneXpert MRSA Assay (FDA approved for NASAL specimens only), is one component of a comprehensive MRSA colonization surveillance program. It is not intended to diagnose MRSA infection nor to guide or monitor treatment for MRSA infections.     Radiology Reports Dg Chest 1 View  11/04/2015  CLINICAL DATA:  Hypotension. EXAM: CHEST 1 VIEW COMPARISON:  11/03/2015 and 01/10/2015 FINDINGS: Patient is rotated to the left. The lungs are adequately inflated with mild chronic left basilar opacification unchanged to slightly worse. Cardiomediastinal silhouette and remainder of the exam is unchanged. IMPRESSION: Chronic opacification left base with suggestion of mild interval worsening. Cannot exclude atelectasis versus developing small effusion or early infection. Electronically Signed   By: Marin Olp M.D.   On: 11/04/2015 19:29   Ct Head Wo Contrast  11/03/2015  CLINICAL DATA:  Head injury after fall at nursing facility. EXAM: CT HEAD WITHOUT CONTRAST CT CERVICAL SPINE WITHOUT CONTRAST TECHNIQUE: Multidetector CT imaging of the head and cervical spine was performed following the standard protocol without intravenous contrast. Multiplanar CT image reconstructions of the cervical spine were also generated. COMPARISON:  CT scan of cervical spine of September 19, 2015. CT scan of June 25, 2015. FINDINGS: CT HEAD FINDINGS Bony calvarium appears intact. Mild diffuse cortical atrophy is noted. Moderate chronic ischemic white matter disease is noted. Probable old left cerebellar infarction. No mass effect or midline shift is noted. Ventricular size is within normal limits. There is no evidence of mass lesion, hemorrhage or acute infarction. Stable old lacunar infarction in right basal ganglia is noted. CT CERVICAL SPINE  FINDINGS Reversal of normal lordosis of cervical spine is noted most likely degenerative in origin. Grade 1 anterolisthesis of C4-5 is noted secondary to posterior facet joint hypertrophy. Severe  degenerative disc disease is noted at C5-6, C6-7 and C7-T1. Visualized lung apices are unremarkable. IMPRESSION: Mild diffuse cortical atrophy. Moderate chronic ischemic white matter disease. Stable probable old left cerebellar infarction. No acute intracranial abnormality seen. Severe multilevel degenerative disc disease. No acute abnormality seen in the cervical spine. Electronically Signed   By: Marijo Conception, M.D.   On: 11/03/2015 20:02   Ct Cervical Spine Wo Contrast  11/03/2015  CLINICAL DATA:  Head injury after fall at nursing facility. EXAM: CT HEAD WITHOUT CONTRAST CT CERVICAL SPINE WITHOUT CONTRAST TECHNIQUE: Multidetector CT imaging of the head and cervical spine was performed following the standard protocol without intravenous contrast. Multiplanar CT image reconstructions of the cervical spine were also generated. COMPARISON:  CT scan of cervical spine of September 19, 2015. CT scan of June 25, 2015. FINDINGS: CT HEAD FINDINGS Bony calvarium appears intact. Mild diffuse cortical atrophy is noted. Moderate chronic ischemic white matter disease is noted. Probable old left cerebellar infarction. No mass effect or midline shift is noted. Ventricular size is within normal limits. There is no evidence of mass lesion, hemorrhage or acute infarction. Stable old lacunar infarction in right basal ganglia is noted. CT CERVICAL SPINE FINDINGS Reversal of normal lordosis of cervical spine is noted most likely degenerative in origin. Grade 1 anterolisthesis of C4-5 is noted secondary to posterior facet joint hypertrophy. Severe degenerative disc disease is noted at C5-6, C6-7 and C7-T1. Visualized lung apices are unremarkable. IMPRESSION: Mild diffuse cortical atrophy. Moderate chronic ischemic white matter disease.  Stable probable old left cerebellar infarction. No acute intracranial abnormality seen. Severe multilevel degenerative disc disease. No acute abnormality seen in the cervical spine. Electronically Signed   By: Marijo Conception, M.D.   On: 11/03/2015 20:02   Dg Chest Port 1 View  11/03/2015  CLINICAL DATA:  Preop chest exam.  Fall today. EXAM: PORTABLE CHEST 1 VIEW COMPARISON:  01/10/2015 FINDINGS: Normal heart size. Negative aortic and hilar contours. Right upper mediastinal convexity is from ectatic vessels based on 2016 chest CT. Blunting of the lateral left costophrenic sulcus is scarring based on 2016 chest CT. There is no edema, consolidation, effusion, or pneumothorax. No visible fracture. IMPRESSION: No active disease. Electronically Signed   By: Monte Fantasia M.D.   On: 11/03/2015 20:43   Dg Hip Operative Unilat W Or W/o Pelvis Right  11/04/2015  CLINICAL DATA:  82 year old male status post right hip fixation EXAM: OPERATIVE RIGHT HIP (WITH PELVIS IF PERFORMED) 6 VIEWS TECHNIQUE: Fluoroscopic spot image(s) were submitted for interpretation post-operatively. COMPARISON:  preoperative radiographs 11/03/2015 FINDINGS: Six intraoperative spot radiographs demonstrate fixation of the right intertrochanteric fracture with a proximal age medullary nail including a single distal interlocking screw and a transfemoral neck gamma nail. No evidence of immediate hardware complication. Alignment appears anatomic. IMPRESSION: ORIF right intertrochanteric fracture as described above without evidence of acute hardware complication. Electronically Signed   By: Jacqulynn Cadet M.D.   On: 11/04/2015 12:21   Dg Hip Unilat With Pelvis 2-3 Views Right  11/03/2015  CLINICAL DATA:  Fall today with right hip pain, initial encounter EXAM: DG HIP (WITH OR WITHOUT PELVIS) 2-3V RIGHT COMPARISON:  None. FINDINGS: There is a subtle undisplaced fracture at the base of the right femoral neck involving the intratrochanteric region.  Pelvic ring is intact. Postsurgical changes are noted. IMPRESSION: Undisplaced fracture at the base of the right femoral neck extending into the intertrochanteric region. Electronically Signed   By: Linus Mako.D.  On: 11/03/2015 20:08     CBC  Recent Labs Lab 11/03/15 1929 11/04/15 0524 11/04/15 1901 11/05/15 0403 11/06/15 0441  WBC 7.3 11.8* 9.3 9.2 14.3*  HGB 12.9* 12.4* 9.8* 9.2* 9.1*  HCT 39.3* 37.4* 29.7* 27.7* 27.4*  PLT 214 214 170 155 150  MCV 98.1 98.0 97.7 97.8 99.0  MCH 32.2 32.6 32.3 32.4 33.0  MCHC 32.9 33.3 33.1 33.1 33.3  RDW 13.5 13.2 13.2 13.2 13.5  LYMPHSABS 2.9  --   --   --   --   MONOABS 0.6  --   --   --   --   EOSABS 0.2  --   --   --   --   BASOSABS 0.0  --   --   --   --     Chemistries   Recent Labs Lab 11/03/15 1929 11/04/15 0524 11/05/15 0403 11/06/15 0441  NA 141 138 137 139  K 3.2* 4.1 4.0 4.0  CL 118* 104 109 108  CO2 19* 29 25 26   GLUCOSE 75 105* 97 109*  BUN 11 13 13 11   CREATININE 0.73 0.85 0.77 0.80  CALCIUM 6.4* 8.6* 7.9* 8.2*  AST 14*  --   --   --   ALT 6*  --   --   --   ALKPHOS 42  --   --   --   BILITOT 0.8  --   --   --    ------------------------------------------------------------------------------------------------------------------ estimated creatinine clearance is 60.6 mL/min (by C-G formula based on Cr of 0.8). ------------------------------------------------------------------------------------------------------------------ No results for input(s): HGBA1C in the last 72 hours. ------------------------------------------------------------------------------------------------------------------ No results for input(s): CHOL, HDL, LDLCALC, TRIG, CHOLHDL, LDLDIRECT in the last 72 hours. ------------------------------------------------------------------------------------------------------------------ No results for input(s): TSH, T4TOTAL, T3FREE, THYROIDAB in the last 72 hours.  Invalid input(s):  FREET3 ------------------------------------------------------------------------------------------------------------------ No results for input(s): VITAMINB12, FOLATE, FERRITIN, TIBC, IRON, RETICCTPCT in the last 72 hours.  Coagulation profile  Recent Labs Lab 11/03/15 1929  INR 1.03    No results for input(s): DDIMER in the last 72 hours.  Cardiac Enzymes No results for input(s): CKMB, TROPONINI, MYOGLOBIN in the last 168 hours.  Invalid input(s): CK ------------------------------------------------------------------------------------------------------------------ Invalid input(s): [place]  Patient is 82 year old status post hip fracture 1. Right hip fracture  Status post open reduction and internal fixation. Continue Lovenox for DVT prophylaxis, physical therapy as tolerated. 2. Hypertension;; resolved hypotension. Restart the metoprolol because of tachycardia.   3.Depression continue Prozac and   4. Dementia continue Aricept and Namenda  5. Misc: heparin for dvt proph #6 dysphagia: Continue dysphagia 3 diet with nectar thick liquids,  Low-grade temperature and elevated white count: Check the urinalysis, chest x-ray, continue to monitor for further development of fever or other signs of sepsis. likely discharge to rehabilitation tomorrow.     Code Status Orders        Start     Ordered   11/03/15 2222  Full code   Continuous     11/03/15 2221    Code Status History    Date Active Date Inactive Code Status Order ID Comments User Context   11/03/2015 10:16 PM 11/03/2015 10:22 PM Full Code [id number]  Dereck Leep, MD ED           Consults  ortho  DVT Prophylaxis  heprin  Lab Results  Component Value Date   PLT 150 11/06/2015     Time Spent in minutes   25  min  Epifanio Lesches M.D on 11/06/2015 at 7:57 AM  Between 7am to 6pm - Pager - [phone]  After 6pm go to [email or web address] - password [email or web address] [place]  [facility]   Office  [phone]

## 2015-11-06 NOTE — Progress Notes (Signed)
  Subjective: 2 Days Post-Op Procedure(s) (LRB): INTRAMEDULLARY (IM) NAIL INTERTROCHANTRIC (Right) Patient reports pain as moderate, per nursing.   Patient seen in rounds with Dr. Rudene Christians. Patient is well, and has had no acute complaints or problems.  More alert this morning with good communication. Plan is to go Skilled nursing facility after hospital stay. Negative for chest pain and shortness of breath Fever: no Gastrointestinal: Negative for nausea and vomiting  Objective: Vital signs in last 24 hours: Temp:  [98.3 F (36.8 C)-100.4 F (38 C)] 98.9 F (37.2 C) (02/19 0347) Pulse Rate:  [79-97] 97 (02/19 0347) Resp:  [16-18] 18 (02/18 2007) BP: (110-128)/(47-91) 115/47 mmHg (02/19 0347) SpO2:  [93 %-100 %] 93 % (02/19 0347)  Intake/Output from previous day:  Intake/Output Summary (Last 24 hours) at 11/06/15 0623 Last data filed at 11/05/15 1741  Gross per 24 hour  Intake    120 ml  Output    850 ml  Net   -730 ml    Intake/Output this shift:    Labs:  Recent Labs  11/03/15 1929 11/04/15 0524 11/04/15 1901 11/05/15 0403 11/06/15 0441  HGB 12.9* 12.4* 9.8* 9.2* 9.1*    Recent Labs  11/05/15 0403 11/06/15 0441  WBC 9.2 14.3*  RBC 2.83* 2.77*  HCT 27.7* 27.4*  PLT 155 150    Recent Labs  11/05/15 0403 11/06/15 0441  NA 137 139  K 4.0 4.0  CL 109 108  CO2 25 26  BUN 13 11  CREATININE 0.77 0.80  GLUCOSE 97 109*  CALCIUM 7.9* 8.2*    Recent Labs  11/03/15 1929  INR 1.03     EXAM General - Patient is Minimal communication with limited reaction to questions. Extremity - Neurologically intact Neurovascular intact Intact pulses distally Compartment soft Dressing/Incision - clean, dry, no drainage Motor Function - intact, moving foot and toes well on exam. Minimal physical therapy yesterday.  Past Medical History  Diagnosis Date  . Depressed   . Dementia   . GERD (gastroesophageal reflux disease)   . Prostate cancer ([place])   . Atrial  fibrillation ([place])   . Ataxia   . PE (pulmonary embolism)     Assessment/Plan: 2 Days Post-Op Procedure(s) (LRB): INTRAMEDULLARY (IM) NAIL INTERTROCHANTRIC (Right) Principal Problem:   Hip fracture, right (HCC) Active Problems:   Hip fracture (HCC)  Estimated body mass index is 24.33 kg/(m^2) as calculated from the following:   Height as of this encounter: 5\' 8"  (1.727 m).   Weight as of this encounter: 72.576 kg (160 lb). Discharge to SNF when cleared medically. The patient will be on Lovenox 40 mg subcutaneous daily for 14 days after discharge. The patient will need a bowel movement before discharge. The patient will need to follow up with Dr. Marry Guan in 6 weeks for reevaluation.  DVT Prophylaxis - Lovenox, Foot Pumps and TED hose Weight-Bearing as tolerated to right leg  Reche Dixon, PA-C Orthopaedic Surgery 11/06/2015, 6:23 AM

## 2015-11-06 NOTE — Progress Notes (Signed)
MD made aware of pt bp and pending dose of lopressor. Order to hold pm dose

## 2015-11-06 NOTE — Progress Notes (Signed)
MD aware of chest xray results, no orders as patient is not hypoxic.

## 2015-11-06 NOTE — Care Management Important Message (Signed)
Important Message  Patient Details  Name: Jorge Moore MRN: [id number] Date of Birth: 1924-07-23   Medicare Important Message Given:  Yes    Nidhi Jacome A, RN 11/06/2015, 2:38 PM

## 2015-11-06 NOTE — Discharge Instructions (Signed)
INSTRUCTIONS AFTER Surgery  o Remove items at home which could result in a fall. This includes throw rugs or furniture in walking pathways o ICE to the affected joint every three hours while awake for 30 minutes at a time, for at least the first 3-5 days, and then as needed for pain and swelling.  Continue to use ice for pain and swelling. You may notice swelling that will progress down to the foot and ankle.  This is normal after surgery.  Elevate your leg when you are not up walking on it.   o Continue to use the breathing machine you got in the hospital (incentive spirometer) which will help keep your temperature down.  It is common for your temperature to cycle up and down following surgery, especially at night when you are not up moving around and exerting yourself.  The breathing machine keeps your lungs expanded and your temperature down.   DIET:  As you were doing prior to hospitalization, we recommend a well-balanced diet.  DRESSING / WOUND CARE / SHOWERING  The patient has a waterproof dressing in place. He can have this changed to a Covaderm dressing. Patient will need to have this changed every 3 days. Staples can be removed after 2 weeks with application of Steri-Strips. The patient can shower if he has a waterproof bandage intact.  ACTIVITY  o Increase activity slowly as tolerated, but follow the weight bearing instructions below.   o No driving for 6 weeks or until further direction given by your physician.  You cannot drive while taking narcotics.  o No lifting or carrying greater than 10 lbs. until further directed by your surgeon. o Avoid periods of inactivity such as sitting longer than an hour when not asleep. This helps prevent blood clots.  o You may return to work once you are authorized by your doctor.     WEIGHT BEARING  Weight-bearing as tolerated to the right leg.   EXERCISES The patient can work on range of motion and strengthening involving his lower  extremities. He can ambulate with balance training.  CONSTIPATION  Constipation is defined medically as fewer than three stools per week and severe constipation as less than one stool per week.  Even if you have a regular bowel pattern at home, your normal regimen is likely to be disrupted due to multiple reasons following surgery.  Combination of anesthesia, postoperative narcotics, change in appetite and fluid intake all can affect your bowels.   YOU MUST use at least one of the following options; they are listed in order of increasing strength to get the job done.  They are all available over the counter, and you may need to use some, POSSIBLY even all of these options:    Drink plenty of fluids (prune juice may be helpful) and high fiber foods Colace 100 mg by mouth twice a day  Senokot for constipation as directed and as needed Dulcolax (bisacodyl), take with full glass of water  Miralax (polyethylene glycol) once or twice a day as needed.  If you have tried all these things and are unable to have a bowel movement in the first 3-4 days after surgery call either your surgeon or your primary doctor.    If you experience loose stools or diarrhea, hold the medications until you stool forms back up.  If your symptoms do not get better within 1 week or if they get worse, check with your doctor.  If you experience "the worst abdominal pain  ever" or develop nausea or vomiting, please contact the office immediately for further recommendations for treatment.   ITCHING:  If you experience itching with your medications, try taking only a single pain pill, or even half a pain pill at a time.  You can also use Benadryl over the counter for itching or also to help with sleep.   TED HOSE STOCKINGS:  Use stockings on both legs until for at least 2 weeks or as directed by physician office. They may be removed at night for sleeping.  MEDICATIONS:  See your medication summary on the After Visit Summary that  nursing will review with you.  You may have some home medications which will be placed on hold until you complete the course of blood thinner medication.  It is important for you to complete the blood thinner medication as prescribed.  PRECAUTIONS:  If you experience chest pain or shortness of breath - call 911 immediately for transfer to the hospital emergency department.   If you develop a fever greater that 101 F, purulent drainage from wound, increased redness or drainage from wound, foul odor from the wound/dressing, or calf pain - CONTACT YOUR SURGEON.                                                   FOLLOW-UP APPOINTMENTS:  If you do not already have a post-op appointment, please call the office for an appointment to be seen by your surgeon.  Guidelines for how soon to be seen are listed in your After Visit Summary, but are typically between 1-4 weeks after surgery.  OTHER INSTRUCTIONS:     MAKE SURE YOU:   Understand these instructions.   Get help right away if you are not doing well or get worse.    Thank you for letting us be a part of your medical care team.  It is a privilege we respect greatly.  We hope these instructions will help you stay on track for a fast and full recovery!

## 2015-11-07 ENCOUNTER — Encounter
Admission: RE | Admit: 2015-11-07 | Discharge: 2015-11-07 | Disposition: A | Discharge status: Home / Self Care | Payer: Medicare Other | Source: Ambulatory Visit | Attending: Internal Medicine | Admitting: Internal Medicine

## 2015-11-07 MED ORDER — LEVOFLOXACIN 500 MG PO TABS: 500.0000 mg | ORAL_TABLET | Freq: Every day | ORAL | Status: DC

## 2015-11-07 NOTE — Progress Notes (Signed)
Physical Therapy Treatment Patient Details Name: Jorge Moore MRN: [id number] DOB: 07-28-1924 Today's Date: 11/07/2015    History of Present Illness Pt is an 82 y/o male who is a resident at the [facility].  He had a fall and suffered a R hip fracture needing an ORIF.  He has baseline dementia, apparently he was walking with FWW prior to this incident.    PT Comments    Second attempt for PT; pt finished with breakfast. Pt continues lethargic, but agreeable to PT. Pt denies pain in right hip initially, but does verbalize pain/discomfort with Right lower extremity movement/exercise. Pt requires increased instruction to complete tasks at times as well as tactile cueing. Held treatment to bed exercises, as pt is to get prepared for discharge/transfer to skilled nursing facility this morning.   Follow Up Recommendations  SNF     Equipment Recommendations       Recommendations for Other Services       Precautions / Restrictions Precautions Precautions: Fall Restrictions Weight Bearing Restrictions: Yes RLE Weight Bearing: Weight bearing as tolerated    Mobility  Bed Mobility               General bed mobility comments: Not tested, as pt to be bathed and prepared for discharge  Transfers                    Ambulation/Gait                 Stairs            Wheelchair Mobility    Modified Rankin (Stroke Patients Only)       Balance                                    Cognition Arousal/Alertness: Lethargic Behavior During Therapy: WFL for tasks assessed/performed Overall Cognitive Status: History of cognitive impairments - at baseline                      Exercises General Exercises - Lower Extremity Ankle Circles/Pumps: AROM;AAROM;Both;20 reps;Supine Quad Sets: Strengthening;Both;Supine;15 reps Gluteal Sets: Strengthening;Both;15 reps;Supine Short Arc Quad: AAROM;AROM;Both;15 reps;Supine Heel Slides: AAROM;Both;15  reps;Supine Hip ABduction/ADduction: AAROM;Both;15 reps;Supine Straight Leg Raises: AAROM;Both;10 reps;Supine    General Comments        Pertinent Vitals/Pain Pain Assessment: No/denies pain (initially denies; verbalizes discomfort with RLE exercise)    Home Living                      Prior Function            PT Goals (current goals can now be found in the care plan section) Progress towards PT goals: Progressing toward goals (slowly)    Frequency  BID    PT Plan Current plan remains appropriate    Co-evaluation             End of Session   Activity Tolerance: Patient limited by pain;Patient limited by lethargy Patient left: in bed;with call bell/phone within reach;with bed alarm set     Time: [id number] PT Time Calculation (min) (ACUTE ONLY): 23 min  Charges:  $Therapeutic Exercise: 23-37 mins                    G Codes:      Charlaine Dalton 11/07/2015, 11:43 AM

## 2015-11-07 NOTE — Clinical Social Work Placement (Signed)
   CLINICAL SOCIAL WORK PLACEMENT  NOTE  Date:  11/07/2015  Patient Details  Name: Jorge Moore MRN: [id number] Date of Birth: 1923/11/01  Clinical Social Work is seeking post-discharge placement for this patient at the [place] level of care (*CSW will initial, date and re-position this form in  chart as items are completed):  Yes   Patient/family provided with [place] Work Department's list of facilities offering this level of care within the geographic area requested by the patient (or if unable, by the patient's family).  Yes   Patient/family informed of their freedom to choose among providers that offer the needed level of care, that participate in Medicare, Medicaid or managed care program needed by the patient, have an available bed and are willing to accept the patient.  Yes   Patient/family informed of [facility]'s ownership interest in [facility] and [facility], as well as of the fact that they are under no obligation to receive care at these facilities.  PASRR submitted to EDS on       PASRR number received on       Existing PASRR number confirmed on 11/04/15     FL2 transmitted to all facilities in geographic area requested by pt/family on 11/04/15     FL2 transmitted to all facilities within larger geographic area on       Patient informed that his/her managed care company has contracts with or will negotiate with certain facilities, including the following:        Yes   Patient/family informed of bed offers received.  Patient chooses bed at  [facility] )     Physician recommends and patient chooses bed at      Patient to be transferred to  [facility] ) on 11/07/15.  Patient to be transferred to facility by  [facility] )     Patient family notified on 11/07/15 of transfer.  Name of family member notified:   (Patient's daughter Jonelle Sidle is aware of D/C today. )     PHYSICIAN        Additional Comment:    _______________________________________________ Loralyn Freshwater, LCSW 11/07/2015, 9:50 AM

## 2015-11-07 NOTE — Discharge Summary (Signed)
Jorge Moore, is a 82 y.o. male  DOB 07-Jun-1924  MRN [id number].  Admission date:  11/03/2015  Admitting Physician  Vaughan Basta, MD  Discharge Date:  11/07/2015   Primary MD  Adrian Prows, MD  Recommendations for primary care physician for things to follow:  Follow up Dr. Marry Guan in 6 weeks   Admission Diagnosis  Pre-op chest exam [id number] Hip fracture, right, closed, initial encounter [facility]) [S72.001A]   Discharge Diagnosis  Pre-op chest exam [id number] Hip fracture, right, closed, initial encounter ([place]) [S72.001A]    Principal Problem:   Hip fracture, right [facility]) Active Problems:   Hip fracture [facility])      Past Medical History  Diagnosis Date  . Depressed   . Dementia   . GERD (gastroesophageal reflux disease)   . Prostate cancer ([place])   . Atrial fibrillation ([place])   . Ataxia   . PE (pulmonary embolism)     Past Surgical History  Procedure Laterality Date  . Intramedullary (im) nail intertrochanteric Right 11/04/2015    Procedure: INTRAMEDULLARY (IM) NAIL INTERTROCHANTRIC;  Surgeon: Dereck Leep, MD;  Location: [facility] ORS;  Service: Orthopedics;  Laterality: Right;       History of present illness and  Hospital Course:     Kindly see H&P for history of present illness and admission details, please review complete Labs, Consult reports and Test reports for all details in brief  HPI  from the history and physical done on the day of admission  82 year old male patient with dementia, history of prostate cancer, had a fall and suffered a right hip fracture. Patient admitted for this.  Hospital Course   #1 right hip fracture: Patient had right intertrochanteric femur fracture, seen by orthopedic Dr. Marry Guan had open reduction and internal fixation of right intertrochanteric femur fracture  on 17th of February. Patient tolerated the procedure well. Started on Lovenox. Patient will go to [facility] skilled nursing facility today and continue Lovenox for 14 days, follow up with Dr. in 6 weeks. Seen by physical therapy, his involvement is limited because of severe dementia and the unable to follow commands and directions for physical therapy. Can use tramadol, oxycodone for pain control. #2.. Dementia: Use Aricept. #3 hypertension: Patient had hypotension that's resolved. Patient started the metoprolol at a lower dose. 4*. Left lower lobe pneumonia: Started on Levaquin.   Discharge Condition: Stable   Follow UP  Follow-up Information    Follow up with [facility] .   Specialty:  [place] information:   [address] [place] [phone]      Follow up with Dereck Leep, MD In 6 weeks.   Specialty:  Orthopedic Surgery   Why:  For x-rays with Dr. Nicole Cella information:   [address] [facility] [place] [address] [phone]         Discharge Instructions  and  Discharge Medications   Continue Lovenox 40 mg subcutaneous daily for 14 days after discharge.     Medication List    TAKE these medications        acetaminophen 325 MG tablet  Commonly known as:  TYLENOL  Take 650 mg by mouth every 4 (four) hours as needed.     acetaminophen 500 MG tablet  Commonly known as:  TYLENOL  Take 500 mg by mouth 3 (three) times daily. Along with tramadol     aspirin 81 MG chewable tablet  Chew  81 mg by mouth daily.     donepezil 10 MG tablet  Commonly known as:  ARICEPT  Take 10 mg by mouth daily.     enoxaparin 40 MG/0.4ML injection  Commonly known as:  LOVENOX  Inject 0.4 mLs (40 mg total) into the skin daily.     FLUoxetine 10 MG capsule  Commonly known as:  PROZAC  Take 10 mg by mouth daily.     levofloxacin 500 MG tablet  Commonly known as:  LEVAQUIN  Take 1  tablet (500 mg total) by mouth daily.     Melatonin 3 MG Tabs  Take 1 tablet by mouth at bedtime as needed.     memantine 10 MG tablet  Commonly known as:  NAMENDA  Take 10 mg by mouth daily.     metoprolol tartrate 25 MG tablet  Commonly known as:  LOPRESSOR  Take 12.5 mg by mouth 2 (two) times daily.     oxyCODONE 5 MG immediate release tablet  Commonly known as:  Oxy IR/ROXICODONE  Take 1-2 tablets (5-10 mg total) by mouth every 4 (four) hours as needed for breakthrough pain ((for MODERATE breakthrough pain)).     oyster calcium 500 MG Tabs tablet  Take 500 mg of elemental calcium by mouth 2 (two) times daily.     traMADol 50 MG tablet  Commonly known as:  ULTRAM  Take 1 tablet (50 mg total) by mouth every 4 (four) hours as needed for moderate pain.          Diet and Activity recommendation: See Discharge Instructions above   Consults obtained - although, urology, physical therapy   Major procedures and Radiology Reports - PLEASE review detailed and final reports for all details, in brief -      Dg Chest 1 View  11/06/2015  CLINICAL DATA:  Post op hip repair 11-04-15, now with fever EXAM: CHEST 1 VIEW COMPARISON:  11/04/2015 FINDINGS: Heart size is normal. There is persistent density in the medial left lung base, associated with volume loss, suggestive of atelectasis. Infiltrate is also a consideration. There is a small left pleural effusion. No pulmonary edema. Pleural thickening noted at the right lung apex. IMPRESSION: Left lower lobe atelectasis and/or infiltrate associated with effusion. Electronically Signed   By: Nolon Nations M.D.   On: 11/06/2015 10:24   Dg Chest 1 View  11/04/2015  CLINICAL DATA:  Hypotension. EXAM: CHEST 1 VIEW COMPARISON:  11/03/2015 and 01/10/2015 FINDINGS: Patient is rotated to the left. The lungs are adequately inflated with mild chronic left basilar opacification unchanged to slightly worse. Cardiomediastinal silhouette and remainder  of the exam is unchanged. IMPRESSION: Chronic opacification left base with suggestion of mild interval worsening. Cannot exclude atelectasis versus developing small effusion or early infection. Electronically Signed   By: Marin Olp M.D.   On: 11/04/2015 19:29   Ct Head Wo Contrast  11/03/2015  CLINICAL DATA:  Head injury after fall at nursing facility. EXAM: CT HEAD WITHOUT CONTRAST CT CERVICAL SPINE WITHOUT CONTRAST TECHNIQUE: Multidetector CT imaging of the head and cervical spine was performed following the standard protocol without intravenous contrast. Multiplanar CT image reconstructions of the cervical spine were also generated. COMPARISON:  CT scan of cervical spine of September 19, 2015. CT scan of June 25, 2015. FINDINGS: CT HEAD FINDINGS Bony calvarium appears intact. Mild diffuse cortical atrophy is noted. Moderate chronic ischemic white matter disease is noted. Probable old left cerebellar infarction. No mass effect or midline shift is noted.  Ventricular size is within normal limits. There is no evidence of mass lesion, hemorrhage or acute infarction. Stable old lacunar infarction in right basal ganglia is noted. CT CERVICAL SPINE FINDINGS Reversal of normal lordosis of cervical spine is noted most likely degenerative in origin. Grade 1 anterolisthesis of C4-5 is noted secondary to posterior facet joint hypertrophy. Severe degenerative disc disease is noted at C5-6, C6-7 and C7-T1. Visualized lung apices are unremarkable. IMPRESSION: Mild diffuse cortical atrophy. Moderate chronic ischemic white matter disease. Stable probable old left cerebellar infarction. No acute intracranial abnormality seen. Severe multilevel degenerative disc disease. No acute abnormality seen in the cervical spine. Electronically Signed   By: Marijo Conception, M.D.   On: 11/03/2015 20:02   Ct Cervical Spine Wo Contrast  11/03/2015  CLINICAL DATA:  Head injury after fall at nursing facility. EXAM: CT HEAD WITHOUT CONTRAST  CT CERVICAL SPINE WITHOUT CONTRAST TECHNIQUE: Multidetector CT imaging of the head and cervical spine was performed following the standard protocol without intravenous contrast. Multiplanar CT image reconstructions of the cervical spine were also generated. COMPARISON:  CT scan of cervical spine of September 19, 2015. CT scan of June 25, 2015. FINDINGS: CT HEAD FINDINGS Bony calvarium appears intact. Mild diffuse cortical atrophy is noted. Moderate chronic ischemic white matter disease is noted. Probable old left cerebellar infarction. No mass effect or midline shift is noted. Ventricular size is within normal limits. There is no evidence of mass lesion, hemorrhage or acute infarction. Stable old lacunar infarction in right basal ganglia is noted. CT CERVICAL SPINE FINDINGS Reversal of normal lordosis of cervical spine is noted most likely degenerative in origin. Grade 1 anterolisthesis of C4-5 is noted secondary to posterior facet joint hypertrophy. Severe degenerative disc disease is noted at C5-6, C6-7 and C7-T1. Visualized lung apices are unremarkable. IMPRESSION: Mild diffuse cortical atrophy. Moderate chronic ischemic white matter disease. Stable probable old left cerebellar infarction. No acute intracranial abnormality seen. Severe multilevel degenerative disc disease. No acute abnormality seen in the cervical spine. Electronically Signed   By: Marijo Conception, M.D.   On: 11/03/2015 20:02   Dg Chest Port 1 View  11/03/2015  CLINICAL DATA:  Preop chest exam.  Fall today. EXAM: PORTABLE CHEST 1 VIEW COMPARISON:  01/10/2015 FINDINGS: Normal heart size. Negative aortic and hilar contours. Right upper mediastinal convexity is from ectatic vessels based on 2016 chest CT. Blunting of the lateral left costophrenic sulcus is scarring based on 2016 chest CT. There is no edema, consolidation, effusion, or pneumothorax. No visible fracture. IMPRESSION: No active disease. Electronically Signed   By: Monte Fantasia M.D.    On: 11/03/2015 20:43   Dg Hip Operative Unilat W Or W/o Pelvis Right  11/04/2015  CLINICAL DATA:  82 year old male status post right hip fixation EXAM: OPERATIVE RIGHT HIP (WITH PELVIS IF PERFORMED) 6 VIEWS TECHNIQUE: Fluoroscopic spot image(s) were submitted for interpretation post-operatively. COMPARISON:  preoperative radiographs 11/03/2015 FINDINGS: Six intraoperative spot radiographs demonstrate fixation of the right intertrochanteric fracture with a proximal age medullary nail including a single distal interlocking screw and a transfemoral neck gamma nail. No evidence of immediate hardware complication. Alignment appears anatomic. IMPRESSION: ORIF right intertrochanteric fracture as described above without evidence of acute hardware complication. Electronically Signed   By: Jacqulynn Cadet M.D.   On: 11/04/2015 12:21   Dg Hip Unilat With Pelvis 2-3 Views Right  11/03/2015  CLINICAL DATA:  Fall today with right hip pain, initial encounter EXAM: DG HIP (WITH OR WITHOUT PELVIS)  2-3V RIGHT COMPARISON:  None. FINDINGS: There is a subtle undisplaced fracture at the base of the right femoral neck involving the intratrochanteric region. Pelvic ring is intact. Postsurgical changes are noted. IMPRESSION: Undisplaced fracture at the base of the right femoral neck extending into the intertrochanteric region. Electronically Signed   By: Inez Catalina M.D.   On: 11/03/2015 20:08    Micro Results    Recent Results (from the past 240 hour(s))  Urine culture     Status: None   Collection Time: 11/03/15  8:27 PM  Result Value Ref Range Status   Specimen Description URINE, RANDOM  Final   Special Requests Normal  Final   Culture MULTIPLE SPECIES PRESENT, SUGGEST RECOLLECTION  Final   Report Status 11/05/2015 FINAL  Final  MRSA PCR Screening     Status: None   Collection Time: 11/03/15 10:10 PM  Result Value Ref Range Status   MRSA by PCR NEGATIVE NEGATIVE Final    Comment:        The GeneXpert MRSA  Assay (FDA approved for NASAL specimens only), is one component of a comprehensive MRSA colonization surveillance program. It is not intended to diagnose MRSA infection nor to guide or monitor treatment for MRSA infections.        Today   Subjective:   Jorge Moore today, has dementia and unable to give any history. Stable to go to [facility] skilled nursing facility today.  Objective:   Blood pressure 125/42, pulse 86, temperature 99.7 F (37.6 C), temperature source Oral, resp. rate 17, height 5\' 8"  (1.727 m), weight 72.576 kg (160 lb), SpO2 97 %.   Intake/Output Summary (Last 24 hours) at 11/07/15 0827 Last data filed at 11/07/15 0651  Gross per 24 hour  Intake      0 ml  Output    750 ml  Net   -750 ml    Exam Awake , but has baseline dementia and with disorientation. No new F.N deficits, Normal affect Leon.AT,PERRAL Supple Neck,No JVD, No cervical lymphadenopathy appriciated.  Symmetrical Chest wall movement, Good air movement bilaterally, CTAB RRR,No Gallops,Rubs or new Murmurs, No Parasternal Heave +ve B.Sounds, Abd Soft, Non tender, No organomegaly appriciated, No rebound -guarding or rigidity. No Cyanosis, Clubbing or edema, No new Rash or bruise  Data Review   CBC w Diff: Lab Results  Component Value Date   WBC 14.3* 11/06/2015   WBC 10.4 01/12/2015   HGB 9.1* 11/06/2015   HGB 12.8* 01/12/2015   HCT 27.4* 11/06/2015   HCT 38.2* 01/12/2015   PLT 150 11/06/2015   PLT 145* 01/12/2015   LYMPHOPCT 40 11/03/2015   LYMPHOPCT 20.8 01/12/2015   MONOPCT 8 11/03/2015   MONOPCT 13.6 01/12/2015   EOSPCT 2 11/03/2015   EOSPCT 0.1 01/12/2015   BASOPCT 1 11/03/2015   BASOPCT 0.3 01/12/2015    CMP: Lab Results  Component Value Date   NA 139 11/06/2015   NA 140 01/12/2015   K 4.0 11/06/2015   K 3.8 01/12/2015   CL 108 11/06/2015   CL 109 01/12/2015   CO2 26 11/06/2015   CO2 26 01/12/2015   BUN 11 11/06/2015   BUN 14 01/12/2015   CREATININE 0.80  11/06/2015   CREATININE 0.91 01/12/2015   PROT 4.4* 11/03/2015   PROT 6.9 01/10/2015   ALBUMIN 2.3* 11/03/2015   ALBUMIN 3.6 01/10/2015   BILITOT 0.8 11/03/2015   BILITOT 0.6 01/10/2015   ALKPHOS 42 11/03/2015   ALKPHOS 78 01/10/2015   AST 14*  11/03/2015   AST 45* 01/10/2015   ALT 6* 11/03/2015   ALT 7* 01/10/2015  .   Total Time in preparing paper work, data evaluation and todays exam - 79 minutes  Thaison Kolodziejski M.D on 11/07/2015 at 8:27 AM    Note: This dictation was prepared with Dragon dictation along with smaller phrase technology. Any transcriptional errors that result from this process are unintentional.

## 2015-11-07 NOTE — Progress Notes (Addendum)
Report called to Gregary Signs at [facility]. Pt to transport via stretcher by EMS. Daughter notified to pick up belongings.  Appt scheduled with Urology (Dr. Pilar Jarvis) to re-evaluate need for foley cath.

## 2015-11-07 NOTE — Progress Notes (Signed)
PT Cancellation Note  Patient Details Name: Jorge Moore MRN: [id number] DOB: 07-14-1924   Cancelled Treatment:    Reason Eval/Treat Not Completed: Fatigue/lethargy limiting ability to participate;Other (comment). Treatment attempted this morning. Nursing in room and notes pt is extremely lethargic today. Nursing also states that the patient has not eaten his breakfast as of yet, and that ST will be by to help him eat. Nursing requests PT check back later this morning; pt is to discharge today before lunch. Will re attempt, as the schedule allows later this morn.    Charlaine Dalton, [place] 11/07/2015, 9:51 AM

## 2015-11-07 NOTE — Progress Notes (Signed)
Patient is medically stable for D/C to [facility] today. Per [name] admissions coordinator at [facility] patient is going to room 205. RN will call report at [phone] and arrange EMS for transport. Clinical Education officer, museum (CSW) sent D/C Summary, FL2 and D/C Packet to [employer] via [employer]. CSW contacted patient's daughter Jorge Moore and made her aware of above. Per daughter she will go to [facility] today to complete admissions paper work. Please reconsult if future social work needs arise. CSW signing off.   Blima Rich, LCSW [phone]

## 2015-11-07 NOTE — Progress Notes (Signed)
Faxed follow up appts to [place] at [facility]. Pt's daughter here to p/u belongings (plant).

## 2015-11-09 LAB — URINALYSIS COMPLETE WITH MICROSCOPIC (ARMC ONLY)
Bilirubin Urine: NEGATIVE
GLUCOSE, UA: NEGATIVE mg/dL
NITRITE: POSITIVE — AB
SPECIFIC GRAVITY, URINE: 1.017 (ref 1.005–1.030)
pH: 9 — ABNORMAL HIGH (ref 5.0–8.0)

## 2015-11-10 NOTE — Addendum Note (Signed)
Addendum  created 11/10/15 1037 by Doreen Salvage, CRNA   Modules edited: Anesthesia Responsible Staff

## 2015-11-11 ENCOUNTER — Encounter: Payer: Self-pay | Admitting: *Deleted

## 2015-11-11 DIAGNOSIS — M5137 Other intervertebral disc degeneration, lumbosacral region: Secondary | ICD-10-CM | POA: Insufficient documentation

## 2015-11-11 DIAGNOSIS — L720 Epidermal cyst: Secondary | ICD-10-CM | POA: Insufficient documentation

## 2015-11-11 DIAGNOSIS — I4891 Unspecified atrial fibrillation: Secondary | ICD-10-CM | POA: Insufficient documentation

## 2015-11-11 LAB — URINE CULTURE: Culture: >=100000

## 2015-11-14 ENCOUNTER — Ambulatory Visit (INDEPENDENT_AMBULATORY_CARE_PROVIDER_SITE_OTHER): Payer: Medicare Other | Admitting: Obstetrics and Gynecology

## 2015-11-14 ENCOUNTER — Encounter: Payer: Self-pay | Admitting: Obstetrics and Gynecology

## 2015-11-14 VITALS — BP 105/67 | HR 67 | Resp 16

## 2015-11-14 DIAGNOSIS — R339 Retention of urine, unspecified: Secondary | ICD-10-CM

## 2015-11-14 NOTE — Progress Notes (Signed)
11/14/2015 10:37 AM   Jorge Moore 1924-05-24 [id number]  Referring provider: Adrian Prows, MD [place], [place] [address]  Chief Complaint  Patient presents with  . Urinary Retention  . Establish Care    HPI: Patient is an 82 year old male with a history of dementia, prostate cancer s/p prostatectomy, atrial fibrillation, diabetes, pulmonary embolism and recent hip fracture requiring surgery and hospitalization. He presents today from rehabilitation facility with his daughter to have Foley catheter removed that was placed during his hip surgery on 11/04/15.  Patient daughter's reports that patient did not have significant urinary symptoms prior to his hip fracture. He did occasionally have episodes of incontinence when he was unable to get to the restroom in time. Otherwise she is unaware of any history of urinary retention or other urinary issues.  Patient's daughter states that the Foley catheter was placed while patient was in surgery and no voiding trials have been attempted since. Patient has advanced dementia and is unable to answer questions or provide medical history. He appears comfortable during this visit.   PMH: Past Medical History  Diagnosis Date  . Depressed   . Dementia   . GERD (gastroesophageal reflux disease)   . Prostate cancer ([place])   . Atrial fibrillation ([place])   . Ataxia   . PE (pulmonary embolism)     Surgical History: Past Surgical History  Procedure Laterality Date  . Intramedullary (im) nail intertrochanteric Right 11/04/2015    Procedure: INTRAMEDULLARY (IM) NAIL INTERTROCHANTRIC;  Surgeon: Dereck Leep, MD;  Location: [facility] ORS;  Service: Orthopedics;  Laterality: Right;    Home Medications:    Medication List       This list is accurate as of: 11/14/15 10:37 AM.  Always use your most recent med list.               acetaminophen 325 MG tablet  Commonly known as:  TYLENOL  Take 650 mg by mouth every 4 (four) hours  as needed.     aspirin 81 MG chewable tablet  Chew 81 mg by mouth daily.     calcium-vitamin D 500-200 MG-UNIT tablet  Take by mouth.     ciprofloxacin 250 MG/5ML (5%) Susr  Commonly known as:  CIPRO  Take 250 mg by mouth 2 (two) times daily.     donepezil 10 MG tablet  Commonly known as:  ARICEPT  Take 10 mg by mouth daily.     FLUoxetine 10 MG capsule  Commonly known as:  PROZAC  Take 10 mg by mouth daily.     Melatonin 3 MG Tabs  Take 1 tablet by mouth at bedtime as needed.     memantine 10 MG tablet  Commonly known as:  NAMENDA  Take 10 mg by mouth daily.     metoprolol tartrate 25 MG tablet  Commonly known as:  LOPRESSOR  Take 12.5 mg by mouth 2 (two) times daily.     oxyCODONE 5 MG immediate release tablet  Commonly known as:  Oxy IR/ROXICODONE  Take 1-2 tablets (5-10 mg total) by mouth every 4 (four) hours as needed for breakthrough pain ((for MODERATE breakthrough pain)).     oyster calcium 500 MG Tabs tablet  Take 500 mg of elemental calcium by mouth 2 (two) times daily.     traMADol 50 MG tablet  Commonly known as:  ULTRAM  Take 1 tablet (50 mg total) by mouth every 4 (four) hours as needed for moderate pain.  Allergies:  Allergies  Allergen Reactions  . Penicillins     Reaction: unknown     Family History: Family History  Problem Relation Age of Onset  . Colon cancer Mother   . Diabetes Father   . Diabetes Brother     Social History:  reports that he has quit smoking. He does not have any smokeless tobacco history on file. He reports that he does not drink alcohol. His drug history is not on file.  ROS: UROLOGY Frequent Urination?: No Hard to postpone urination?: No Burning/pain with urination?: No Get up at night to urinate?: No Leakage of urine?: No Urine stream starts and stops?: No Trouble starting stream?: No Do you have to strain to urinate?: No Blood in urine?: No Urinary tract infection?: No Sexually transmitted  disease?: No Injury to kidneys or bladder?: No Painful intercourse?: No Weak stream?: No Erection problems?: No Penile pain?: No  Gastrointestinal Nausea?: No Vomiting?: No Indigestion/heartburn?: No Diarrhea?: No Constipation?: No  Constitutional Fever: No Night sweats?: No Weight loss?: Yes Fatigue?: Yes  Skin Skin rash/lesions?: No Itching?: No  Eyes Blurred vision?: Yes Double vision?: No  Ears/Nose/Throat Sore throat?: No Sinus problems?: No  Hematologic/Lymphatic Swollen glands?: No Easy bruising?: No  Cardiovascular Leg swelling?: No Chest pain?: No  Respiratory Cough?: No Shortness of breath?: No  Endocrine Excessive thirst?: No  Musculoskeletal Back pain?: Yes Joint pain?: Yes  Neurological Headaches?: No Dizziness?: No  Psychologic Depression?: No Anxiety?: No  Physical Exam: BP 105/67 mmHg  Pulse 67  Resp 16  Ht   Wt   Constitutional:  Alert and oriented, No acute distress, wheelchair bound HEENT: [place] AT, moist mucus membranes.  Trachea midline, no masses. Respiratory: Normal respiratory effort, no increased work of breathing. GI: Abdomen is soft, nontender, nondistended, no abdominal masses GU: Foley catheter present in urinary meatus Skin: No rashes, bruises or suspicious lesions. Lymph: No cervical or inguinal adenopathy. Neurologic: moving upper extremities Psychiatric: flat affect.  Laboratory Data:  Lab Results  Component Value Date   WBC 14.3* 11/06/2015   HGB 9.1* 11/06/2015   HCT 27.4* 11/06/2015   MCV 99.0 11/06/2015   PLT 150 11/06/2015    Lab Results  Component Value Date   CREATININE 0.80 11/06/2015    Lab Results  Component Value Date   PSA  11/15/2010    ========== TEST NAME ==========  ========= RESULTS =========  = REFERENCE RANGE =  PROSTATE-SPECIFIC AG.  PSA, Serum (Serial Monitor) Prostate Specific Ag, Serum     [   <0.1 ng/mL           ]           0.0-4.0 Roche ECLIA methodology.                                                                       . According to the American Urological Association, Serum PSA should decrease and remain at undetectable levels after radical prostatectomy. The AUA defines biochemical recurrence as an initial PSA value 0.2 ng/mL or greater followed by a subsequent confirmatory PSA value 0.2 ng/mL or greater. Values obtained with different assay methods or kits cannot be used interchangeably. Results cannot be interpreted as absolute evidence of the presence or  absence of malignant disease.               [facility]            No: [id number]           [address], [place], [place] [address]           Lindon Romp, MD         [phone]   Result(s) reported on 16 Nov 2010 at 11:49AM.     No results found for: TESTOSTERONE  No results found for: HGBA1C  Urinalysis    Component Value Date/Time   COLORURINE YELLOW* 11/09/2015 0500   COLORURINE Yellow 01/10/2015 1842   APPEARANCEUR TURBID* 11/09/2015 0500   APPEARANCEUR Clear 01/10/2015 1842   LABSPEC 1.017 11/09/2015 0500   LABSPEC 1.032 01/10/2015 1842   PHURINE 9.0* 11/09/2015 0500   PHURINE 5.0 01/10/2015 1842   GLUCOSEU NEGATIVE 11/09/2015 0500   GLUCOSEU Negative 01/10/2015 1842   HGBUR 1+* 11/09/2015 0500   HGBUR 2+ 01/10/2015 1842   BILIRUBINUR NEGATIVE 11/09/2015 0500   BILIRUBINUR Negative 01/10/2015 1842   KETONESUR TRACE* 11/09/2015 0500   KETONESUR Trace 01/10/2015 1842   PROTEINUR >500* 11/09/2015 0500   PROTEINUR Negative 01/10/2015 1842   NITRITE POSITIVE* 11/09/2015 0500   NITRITE Negative 01/10/2015 1842   LEUKOCYTESUR 3+* 11/09/2015 0500   LEUKOCYTESUR Negative 01/10/2015 1842    Pertinent Imaging:   Assessment & Plan:    1.Urinary Retention- Foley catheter removed today. Orders written for rehabilitation facility staff to perform PVRs 3 times daily. Instructions written for patent patient to be catheterized should PVR volumes  consistently be elevated greater than 200 mL's. If PVRs are consistently less than 262mL catheterization and PVRs can be discontinued. Follow-up in 2 weeks with [place] for a PVR.  2. H/o Prostate Cancer-  S/p prostatectomy by Dr. Yves Dill  There are no diagnoses linked to this encounter.  Return in about 2 weeks (around 11/28/2015).  These notes generated with voice recognition software. I apologize for typographical errors.  Herbert Moors, [facility] [address] [place], [place] [address] [phone]

## 2015-11-14 NOTE — Progress Notes (Signed)
Catheter Removal  Patient is present today for a catheter removal.  10 ml of water was drained from the balloon. A 16 FR foley cath was removed from the bladder no complications were noted . Patient tolerated well.  Preformed by: Golden Hurter, CMA  Follow up/ Additional notes: Pt has Alzheimer's disease and is wheelchair bound due to broken right hip.

## 2015-11-16 ENCOUNTER — Inpatient Hospital Stay: Admit: 2015-11-16 | Payer: Self-pay

## 2015-11-16 ENCOUNTER — Encounter
Admission: RE | Admit: 2015-11-16 | Discharge: 2015-11-16 | Disposition: A | Discharge status: Home / Self Care | Payer: Medicare Other | Source: Ambulatory Visit | Attending: Gerontology | Admitting: Gerontology

## 2015-11-16 DIAGNOSIS — R5383 Other fatigue: Secondary | ICD-10-CM | POA: Diagnosis not present

## 2015-11-16 LAB — COMPREHENSIVE METABOLIC PANEL
ALK PHOS: 90 U/L (ref 38–126)
ALT: 11 U/L — ABNORMAL LOW (ref 17–63)
ANION GAP: 6 (ref 5–15)
AST: 26 U/L (ref 15–41)
Albumin: 3.1 g/dL — ABNORMAL LOW (ref 3.5–5.0)
BUN: 21 mg/dL — ABNORMAL HIGH (ref 6–20)
CALCIUM: 9.1 mg/dL (ref 8.9–10.3)
CO2: 31 mmol/L (ref 22–32)
Chloride: 104 mmol/L (ref 101–111)
Creatinine, Ser: 1.04 mg/dL (ref 0.61–1.24)
Glucose, Bld: 74 mg/dL (ref 65–99)
Potassium: 3.6 mmol/L (ref 3.5–5.1)
SODIUM: 141 mmol/L (ref 135–145)
Total Bilirubin: 1.5 mg/dL — ABNORMAL HIGH (ref 0.3–1.2)
Total Protein: 6.2 g/dL — ABNORMAL LOW (ref 6.5–8.1)

## 2015-11-16 LAB — CBC WITH DIFFERENTIAL/PLATELET
Basophils Absolute: 0 10*3/uL (ref 0–0.1)
Basophils Relative: 0 %
EOS ABS: 0.2 10*3/uL (ref 0–0.7)
EOS PCT: 2 %
HCT: 29.2 % — ABNORMAL LOW (ref 40.0–52.0)
HEMOGLOBIN: 9.8 g/dL — AB (ref 13.0–18.0)
LYMPHS ABS: 2.2 10*3/uL (ref 1.0–3.6)
LYMPHS PCT: 21 %
MCH: 32.5 pg (ref 26.0–34.0)
MCHC: 33.6 g/dL (ref 32.0–36.0)
MCV: 96.9 fL (ref 80.0–100.0)
MONOS PCT: 8 %
Monocytes Absolute: 0.8 10*3/uL (ref 0.2–1.0)
Neutro Abs: 7.2 10*3/uL — ABNORMAL HIGH (ref 1.4–6.5)
Neutrophils Relative %: 69 %
Platelets: 483 10*3/uL — ABNORMAL HIGH (ref 150–440)
RBC: 3.01 MIL/uL — ABNORMAL LOW (ref 4.40–5.90)
RDW: 14.6 % — ABNORMAL HIGH (ref 11.5–14.5)
WBC: 10.4 10*3/uL (ref 3.8–10.6)

## 2015-11-29 ENCOUNTER — Emergency Department: Payer: Medicare Other

## 2015-11-29 ENCOUNTER — Encounter: Payer: Self-pay | Admitting: Emergency Medicine

## 2015-11-29 ENCOUNTER — Emergency Department
Admission: EM | Admit: 2015-11-29 | Discharge: 2015-11-29 | Disposition: A | Discharge status: Home / Self Care | Payer: Medicare Other | Attending: Emergency Medicine | Admitting: Emergency Medicine

## 2015-11-29 ENCOUNTER — Ambulatory Visit: Payer: Medicare Other | Admitting: Obstetrics and Gynecology

## 2015-11-29 DIAGNOSIS — F0280 Dementia in other diseases classified elsewhere without behavioral disturbance: Secondary | ICD-10-CM | POA: Diagnosis not present

## 2015-11-29 DIAGNOSIS — Z79899 Other long term (current) drug therapy: Secondary | ICD-10-CM | POA: Insufficient documentation

## 2015-11-29 DIAGNOSIS — G309 Alzheimer's disease, unspecified: Secondary | ICD-10-CM | POA: Diagnosis not present

## 2015-11-29 DIAGNOSIS — Y9389 Activity, other specified: Secondary | ICD-10-CM | POA: Insufficient documentation

## 2015-11-29 DIAGNOSIS — S199XXA Unspecified injury of neck, initial encounter: Secondary | ICD-10-CM | POA: Diagnosis not present

## 2015-11-29 DIAGNOSIS — Y998 Other external cause status: Secondary | ICD-10-CM | POA: Diagnosis not present

## 2015-11-29 DIAGNOSIS — Z880 Allergy status to penicillin: Secondary | ICD-10-CM | POA: Diagnosis not present

## 2015-11-29 DIAGNOSIS — Z87891 Personal history of nicotine dependence: Secondary | ICD-10-CM | POA: Insufficient documentation

## 2015-11-29 DIAGNOSIS — F0390 Unspecified dementia without behavioral disturbance: Secondary | ICD-10-CM

## 2015-11-29 DIAGNOSIS — W19XXXA Unspecified fall, initial encounter: Secondary | ICD-10-CM

## 2015-11-29 DIAGNOSIS — Z7982 Long term (current) use of aspirin: Secondary | ICD-10-CM | POA: Insufficient documentation

## 2015-11-29 DIAGNOSIS — Y9289 Other specified places as the place of occurrence of the external cause: Secondary | ICD-10-CM | POA: Diagnosis not present

## 2015-11-29 DIAGNOSIS — W1839XA Other fall on same level, initial encounter: Secondary | ICD-10-CM | POA: Insufficient documentation

## 2015-11-29 DIAGNOSIS — S79912A Unspecified injury of left hip, initial encounter: Secondary | ICD-10-CM | POA: Insufficient documentation

## 2015-11-29 LAB — BASIC METABOLIC PANEL
ANION GAP: 5 (ref 5–15)
BUN: 26 mg/dL — ABNORMAL HIGH (ref 6–20)
CALCIUM: 9.6 mg/dL (ref 8.9–10.3)
CO2: 30 mmol/L (ref 22–32)
Chloride: 101 mmol/L (ref 101–111)
Creatinine, Ser: 0.94 mg/dL (ref 0.61–1.24)
GLUCOSE: 130 mg/dL — AB (ref 65–99)
POTASSIUM: 3.9 mmol/L (ref 3.5–5.1)
SODIUM: 136 mmol/L (ref 135–145)

## 2015-11-29 LAB — URINALYSIS COMPLETE WITH MICROSCOPIC (ARMC ONLY)
BILIRUBIN URINE: NEGATIVE
Bacteria, UA: NONE SEEN
Glucose, UA: NEGATIVE mg/dL
Hgb urine dipstick: NEGATIVE
KETONES UR: NEGATIVE mg/dL
LEUKOCYTES UA: NEGATIVE
NITRITE: NEGATIVE
PH: 5 (ref 5.0–8.0)
Protein, ur: NEGATIVE mg/dL
SPECIFIC GRAVITY, URINE: 1.028 (ref 1.005–1.030)

## 2015-11-29 LAB — CBC
HCT: 38.2 % — ABNORMAL LOW (ref 40.0–52.0)
HEMOGLOBIN: 12.6 g/dL — AB (ref 13.0–18.0)
MCH: 32.8 pg (ref 26.0–34.0)
MCHC: 33 g/dL (ref 32.0–36.0)
MCV: 99.4 fL (ref 80.0–100.0)
Platelets: 219 10*3/uL (ref 150–440)
RBC: 3.84 MIL/uL — ABNORMAL LOW (ref 4.40–5.90)
RDW: 14.3 % (ref 11.5–14.5)
WBC: 9.3 10*3/uL (ref 3.8–10.6)

## 2015-11-29 LAB — TROPONIN I: Troponin I: <0.03 ng/mL (ref <0.031)

## 2015-11-29 NOTE — Discharge Instructions (Signed)
Your family member was evaluated after a fall, and no serious injury or medical complication was found. Return to the emergency department for any worsening condition including confusion altered mental status, abnormal vital signs, fever, or any other symptoms concerning to you.   Dementia Dementia is a word that is used to describe problems with the brain and how it works. People with dementia have memory loss. They may also have problems with thinking, speaking, or solving problems. It can affect how they act around people, how they do their job, their mood, and their personality. These changes may not show up for a long time. Family or friends may not notice problems in the early part of this disease. HOME CARE The following tips are for the person living with, or caring for, the person with dementia. Make the home safe.  Remove locks on bathroom doors.  Use childproof locks on cabinets where alcohol, cleaning supplies, or chemicals are stored.  Put outlet covers in electrical outlets.  Put in childproof locks to keep doors and windows safe.  Remove stove knobs, or put in safety knobs that shut off on their own.  Lower the temperature on water heaters.  Label medicines. Lock them in a safe place.  Keep knives, lighters, matches, power tools, and guns out of reach or in a safe place.  Remove objects that might break or can hurt the person.  Make sure lighting is good inside and outside.  Put in grab bars if needed.  Use a device that detects falls or other needs for help. Lessen confusion.  Keep familiar objects and people around.  Use night lights or low lit (dim) lights at night.  Label objects or areas.  Use reminders, notes, or directions for daily activities or tasks.  Keep a simple routine that is the same for waking, meals, bathing, dressing, and bedtime.  Create a calm and quiet home.  Put up clocks and calendars.  Keep emergency numbers and the home address  near all phones.  Help show the different times of day. Open the curtains during the day to let light in. Speak clearly and directly.  Choose simple words and short sentences.  Use a gentle, calm voice.  Do not interrupt.  If the person has a hard time finding a word to use, give them the word or thought.  Ask 1 question at a time. Give enough time for the person to answer. Repeat the question if the person does not answer. Do things that lessen restlessness.  Provide a comfortable bed.  Have the same bedtime routine every night.  Have a regular walking and activity schedule.  Lessen naps during the day.  Do not let the person drink a lot of caffeine.  Go to events that are not overwhelming. Eat well and drink fluids.  Lessen distractions during meal times and snacks.  Avoid foods that are too hot or too cold.  Watch how the person chews and swallows. This is to make sure they do not choke. Other  Keep all vision, hearing, dental, and medical visits with the doctor.  Only give medicines as told by the doctor.  Watch the person's driving ability. Do not let the person drive if he or she cannot drive safely.  Use a program that helps find a person if they become missing. You may need to register with this program. GET HELP RIGHT AWAY IF:   A fever of 102 F (38.9 C) develops.  Confusion develops or gets  worse.  Sleepiness develops or gets worse.  Staying awake is hard to do.  New behavior problems start like mood swings, aggression, and seeing things that are not there.  Problems with balance, speech, or falling develop.  Problems swallowing develop.  Any problems of another sickness develop. MAKE SURE YOU:  Understand these instructions.  Will watch his or her condition.  Will get help right away if he or she is not doing well or gets worse.   This information is not intended to replace advice given to you by your health care provider. Make sure you  discuss any questions you have with your health care provider.   Document Released: 08/16/2008 Document Revised: 11/26/2011 Document Reviewed: 01/29/2011 Elsevier Interactive Patient Education 2016 [place] in the Home  Falls can cause injuries. They can happen to people of all ages. There are many things you can do to make your home safe and to help prevent falls.  WHAT CAN I DO ON THE OUTSIDE OF MY HOME?  Regularly fix the edges of walkways and driveways and fix any cracks.  Remove anything that might make you trip as you walk through a door, such as a raised step or threshold.  Trim any bushes or trees on the path to your home.  Use bright outdoor lighting.  Clear any walking paths of anything that might make someone trip, such as rocks or tools.  Regularly check to see if handrails are loose or broken. Make sure that both sides of any steps have handrails.  Any raised decks and porches should have guardrails on the edges.  Have any leaves, snow, or ice cleared regularly.  Use sand or salt on walking paths during winter.  Clean up any spills in your garage right away. This includes oil or grease spills. WHAT CAN I DO IN THE BATHROOM?   Use night lights.  Install grab bars by the toilet and in the tub and shower. Do not use towel bars as grab bars.  Use non-skid mats or decals in the tub or shower.  If you need to sit down in the shower, use a plastic, non-slip stool.  Keep the floor dry. Clean up any water that spills on the floor as soon as it happens.  Remove soap buildup in the tub or shower regularly.  Attach bath mats securely with double-sided non-slip rug tape.  Do not have throw rugs and other things on the floor that can make you trip. WHAT CAN I DO IN THE BEDROOM?  Use night lights.  Make sure that you have a light by your bed that is easy to reach.  Do not use any sheets or blankets that are too big for your bed. They should not  hang down onto the floor.  Have a firm chair that has side arms. You can use this for support while you get dressed.  Do not have throw rugs and other things on the floor that can make you trip. WHAT CAN I DO IN THE KITCHEN?  Clean up any spills right away.  Avoid walking on wet floors.  Keep items that you use a lot in easy-to-reach places.  If you need to reach something above you, use a strong step stool that has a grab bar.  Keep electrical cords out of the way.  Do not use floor polish or wax that makes floors slippery. If you must use wax, use non-skid floor wax.  Do not have throw rugs and  other things on the floor that can make you trip. WHAT CAN I DO WITH MY STAIRS?  Do not leave any items on the stairs.  Make sure that there are handrails on both sides of the stairs and use them. Fix handrails that are broken or loose. Make sure that handrails are as long as the stairways.  Check any carpeting to make sure that it is firmly attached to the stairs. Fix any carpet that is loose or worn.  Avoid having throw rugs at the top or bottom of the stairs. If you do have throw rugs, attach them to the floor with carpet tape.  Make sure that you have a light switch at the top of the stairs and the bottom of the stairs. If you do not have them, ask someone to add them for you. WHAT ELSE CAN I DO TO HELP PREVENT FALLS?  Wear shoes that:  Do not have high heels.  Have rubber bottoms.  Are comfortable and fit you well.  Are closed at the toe. Do not wear sandals.  If you use a stepladder:  Make sure that it is fully opened. Do not climb a closed stepladder.  Make sure that both sides of the stepladder are locked into place.  Ask someone to hold it for you, if possible.  Clearly mark and make sure that you can see:  Any grab bars or handrails.  First and last steps.  Where the edge of each step is.  Use tools that help you move around (mobility aids) if they are  needed. These include:  Canes.  Walkers.  Scooters.  Crutches.  Turn on the lights when you go into a dark area. Replace any light bulbs as soon as they burn out.  Set up your furniture so you have a clear path. Avoid moving your furniture around.  If any of your floors are uneven, fix them.  If there are any pets around you, be aware of where they are.  Review your medicines with your doctor. Some medicines can make you feel dizzy. This can increase your chance of falling. Ask your doctor what other things that you can do to help prevent falls.   This information is not intended to replace advice given to you by your health care provider. Make sure you discuss any questions you have with your health care provider.   Document Released: 06/30/2009 Document Revised: 01/18/2015 Document Reviewed: 10/08/2014 Elsevier Interactive Patient Education [employer].

## 2015-11-29 NOTE — ED Provider Notes (Signed)
[facility] Emergency Department Provider Note   ____________________________________________  Time seen:  I have reviewed the triage vital signs and the triage nursing note.  HISTORY  Chief Complaint Fall   Historian Limited, patient with dementia. Nursing home report Family member provides history as well  HPI Jorge Moore is a 82 y.o. male who is a resident at the [facility], but has been at [facility] rehabilitation for the past several weeks due to hip fracture repair, and return to the [facility] just today. He reportedly had a fall, unknown whether or not he struck his head, but there was reported left hip pain and neck pain at the scene. Unknown whether or not there was any loss of consciousness. Family reports that he has had urinary tract infections in the past. He has had some generalized deconditioning given his rehabilitation due to his hip injury, but he had been doing pretty well. He does have a problem with balance and he does have dementia. Symptoms are mild.    Past Medical History  Diagnosis Date  . Depressed   . Dementia   . GERD (gastroesophageal reflux disease)   . Prostate cancer ([place])   . Atrial fibrillation ([place])   . Ataxia   . PE (pulmonary embolism)     Patient Active Problem List   Diagnosis Date Noted  . Atrial fibrillation ([place]) 11/11/2015  . Degeneration of intervertebral disc of lumbosacral region 11/11/2015  . Atheroma, skin 11/11/2015  . Hip fracture, right ([place]) 11/03/2015  . Hip fracture ([place]) 11/03/2015  . Cerebral ventriculomegaly 07/22/2015  . Abnormal computed tomography of head 05/25/2015  . Fall 10/05/2014  . Appendicular ataxia 10/05/2014  . 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 Pulmonary embolism ([place]) 11/16/2010    Past Surgical History  Procedure Laterality Date  . Intramedullary (im) nail intertrochanteric Right 11/04/2015    Procedure: INTRAMEDULLARY (IM) NAIL INTERTROCHANTRIC;  Surgeon:  Dereck Leep, MD;  Location: [facility] ORS;  Service: Orthopedics;  Laterality: Right;    Current Outpatient Rx  Name  Route  Sig  Dispense  Refill  . acetaminophen (TYLENOL) 325 MG tablet   Oral   Take 650 mg by mouth every 4 (four) hours as needed.         Marland Kitchen aspirin 81 MG chewable tablet   Oral   Chew 81 mg by mouth daily.         . Calcium Carbonate-Vitamin D (CALCIUM-VITAMIN D) 500-200 MG-UNIT tablet   Oral   Take by mouth.         . donepezil (ARICEPT) 10 MG tablet   Oral   Take 10 mg by mouth daily.          Marland Kitchen FLUoxetine (PROZAC) 10 MG capsule   Oral   Take 10 mg by mouth daily.         . Melatonin 3 MG TABS   Oral   Take 1 tablet by mouth at bedtime as needed.         . memantine (NAMENDA) 10 MG tablet   Oral   Take 10 mg by mouth daily.         . metoprolol tartrate (LOPRESSOR) 25 MG tablet   Oral   Take 12.5 mg by mouth 2 (two) times daily.         Marland Kitchen oxyCODONE (OXY IR/ROXICODONE) 5 MG immediate release tablet   Oral   Take 1-2 tablets (5-10 mg total) by mouth every 4 (four) hours as  needed for breakthrough pain ((for MODERATE breakthrough pain)).   30 tablet   0   . Oyster Shell (OYSTER CALCIUM) 500 MG TABS tablet   Oral   Take 500 mg of elemental calcium by mouth 2 (two) times daily.         . traMADol (ULTRAM) 50 MG tablet   Oral   Take 1 tablet (50 mg total) by mouth every 4 (four) hours as needed for moderate pain.   30 tablet   1     Allergies Penicillins  Family History  Problem Relation Age of Onset  . Colon cancer Mother   . Diabetes Father   . Diabetes Brother     Social History Social History  Substance Use Topics  . Smoking status: Former Research scientist (life sciences)  . Smokeless tobacco: None  . Alcohol Use: No    Review of Systems  Constitutional: Negative for fever. Eyes: Negative for visual changes. ENT: Negative for sore throat. Cardiovascular: Negative for chest pain. Respiratory: Negative for shortness of  breath. Gastrointestinal: Negative for abdominal pain, vomiting and diarrhea. Genitourinary: Negative for dysuria. Musculoskeletal: Negative for back pain. Skin: Negative for rash. Neurological: Negative for headache. 10 point Review of Systems otherwise negative ____________________________________________   PHYSICAL EXAM:  VITAL SIGNS: ED Triage Vitals  Enc Vitals Group     BP 11/29/15 1952 142/69 mmHg     Pulse Rate 11/29/15 1952 67     Resp 11/29/15 1952 18     Temp 11/29/15 1952 97.7 F (36.5 C)     Temp Source 11/29/15 1952 Oral     SpO2 11/29/15 1952 100 %     Weight 11/29/15 1952 119 lb 14.9 oz (54.4 kg)     Height 11/29/15 1952 5\' 8"  (1.727 m)     Head Cir --      Peak Flow --      Pain Score 11/29/15 1957 8     Pain Loc --      Pain Edu? --      Excl. in [place]? --      Constitutional: Alert and Disoriented. Well appearing and in no distress. HEENT   Head: Normocephalic and atraumatic.      Eyes: Conjunctivae are normal. PERRL. Normal extraocular movements.      Ears:         Nose: No congestion/rhinnorhea.   Mouth/Throat: Mucous membranes are mildly dry.   Neck: No stridor. Cardiovascular/Chest: Normal rate, regular rhythm.  No murmurs, rubs, or gallops. Respiratory: Normal respiratory effort without tachypnea nor retractions. Breath sounds are clear and equal bilaterally. No wheezes/rales/rhonchi. Gastrointestinal: Soft. No distention, no guarding, no rebound. Nontender.    Genitourinary/rectal:Deferred Musculoskeletal: Nontender with normal range of motion in all extremities. No joint effusions.  No lower extremity tenderness.  No edema. Neurologic:  Jumbled speech, but no facial droop. Generalized weakness without focal deficit. Skin:  Skin is warm, dry and intact. No rash noted.   ____________________________________________   EKG I, Lisa Roca, MD, the attending physician have personally viewed and interpreted all ECGs.  67 bpm. Normal  sinus rhythm. Narrow QRS. Normal axis. Nonspecific T-wave ____________________________________________  LABS (pertinent positives/negatives)  Basic metabolic panel without significant abnormality White blood count 9.3, hemoglobin 12.6 but without 19 Troponin less than 0.03 Urinalysis negative for UTI ____________________________________________  RADIOLOGY All Xrays were viewed by me. Imaging interpreted by Radiologist.  Hip left with pelvis:  IMPRESSION: Findings consistent with rectal impaction. Severe degenerative joint disease of the left hip. No fracture  or dislocation is noted.  CT head and neck without contrast:  IMPRESSION: Mild chronic ischemic white matter disease. Minimal diffuse cortical atrophy. Old left cerebellar infarction. Old right basal ganglia infarction. No acute intracranial abnormality seen.  Severe multilevel degenerative disc disease. No acute abnormality seen in the cervical spine. __________________________________________  PROCEDURES  Procedure(s) performed: None  Critical Care performed: None  ____________________________________________   ED COURSE / ASSESSMENT AND PLAN  Pertinent labs & imaging results that were available during my care of the patient were reviewed by me and considered in my medical decision making (see chart for details).   Unclear whether or not this all started head, but the patient has dementia and is unable to really assess his mental status very well.  No evidence of traumatic injury on physical exam, or on head, neck, or hip imaging.  Medical evaluation is reassuring for no medical cause of the fall. Patient will be discharged home. Family requests ambulance transport.    CONSULTATIONS:   None Patient / Family / Caregiver informed of clinical course, medical decision-making process, and agree with plan.   I discussed return precautions, follow-up instructions, and discharged instructions with patient  and/or family.   ___________________________________________   FINAL CLINICAL IMPRESSION(S) / ED DIAGNOSES   Final diagnoses:  Fall, initial encounter  Dementia, without behavioral disturbance              Note: This dictation was prepared with Diplomatic Services operational officer dictation. Any transcriptional errors that result from this process are unintentional   Lisa Roca, MD 11/29/15 2312

## 2015-11-29 NOTE — ED Notes (Signed)
Patient transported to X-ray & CT °

## 2015-11-29 NOTE — ED Notes (Signed)
Pt arrived to the ED via EMS from "[facility]" after sustaining an unwitnessed fall. EMS reports that the Pt was discharged from this hospital today. Pt is complaining of left hip pain and neck pain. Pt reports not knowing if he lost consciousness when he fell. Upon arrival to the hospital the Pt was AOx3 in no apparent distress.

## 2015-11-30 ENCOUNTER — Observation Stay
Admission: EM | Admit: 2015-11-30 | Discharge: 2015-12-01 | Payer: Medicare Other | Attending: Internal Medicine | Admitting: Internal Medicine

## 2015-11-30 ENCOUNTER — Ambulatory Visit: Payer: Medicare Other | Admitting: Obstetrics and Gynecology

## 2015-11-30 ENCOUNTER — Observation Stay: Payer: Medicare Other

## 2015-11-30 ENCOUNTER — Encounter: Payer: Self-pay | Admitting: Emergency Medicine

## 2015-11-30 DIAGNOSIS — M1612 Unilateral primary osteoarthritis, left hip: Secondary | ICD-10-CM | POA: Insufficient documentation

## 2015-11-30 DIAGNOSIS — F0280 Dementia in other diseases classified elsewhere without behavioral disturbance: Secondary | ICD-10-CM | POA: Diagnosis not present

## 2015-11-30 DIAGNOSIS — I2699 Other pulmonary embolism without acute cor pulmonale: Secondary | ICD-10-CM | POA: Insufficient documentation

## 2015-11-30 DIAGNOSIS — M50323 Other cervical disc degeneration at C6-C7 level: Secondary | ICD-10-CM | POA: Diagnosis not present

## 2015-11-30 DIAGNOSIS — Z87891 Personal history of nicotine dependence: Secondary | ICD-10-CM | POA: Diagnosis not present

## 2015-11-30 DIAGNOSIS — R9082 White matter disease, unspecified: Secondary | ICD-10-CM | POA: Diagnosis not present

## 2015-11-30 DIAGNOSIS — R270 Ataxia, unspecified: Secondary | ICD-10-CM | POA: Insufficient documentation

## 2015-11-30 DIAGNOSIS — F329 Major depressive disorder, single episode, unspecified: Secondary | ICD-10-CM | POA: Diagnosis not present

## 2015-11-30 DIAGNOSIS — Z880 Allergy status to penicillin: Secondary | ICD-10-CM | POA: Insufficient documentation

## 2015-11-30 DIAGNOSIS — Z7982 Long term (current) use of aspirin: Secondary | ICD-10-CM | POA: Diagnosis not present

## 2015-11-30 DIAGNOSIS — Z79899 Other long term (current) drug therapy: Secondary | ICD-10-CM | POA: Insufficient documentation

## 2015-11-30 DIAGNOSIS — I482 Chronic atrial fibrillation: Secondary | ICD-10-CM | POA: Diagnosis not present

## 2015-11-30 DIAGNOSIS — R55 Syncope and collapse: Secondary | ICD-10-CM | POA: Diagnosis not present

## 2015-11-30 DIAGNOSIS — I371 Nonrheumatic pulmonary valve insufficiency: Secondary | ICD-10-CM | POA: Insufficient documentation

## 2015-11-30 DIAGNOSIS — G309 Alzheimer's disease, unspecified: Secondary | ICD-10-CM | POA: Insufficient documentation

## 2015-11-30 DIAGNOSIS — K219 Gastro-esophageal reflux disease without esophagitis: Secondary | ICD-10-CM | POA: Insufficient documentation

## 2015-11-30 DIAGNOSIS — M50322 Other cervical disc degeneration at C5-C6 level: Secondary | ICD-10-CM | POA: Diagnosis not present

## 2015-11-30 DIAGNOSIS — G9389 Other specified disorders of brain: Secondary | ICD-10-CM | POA: Insufficient documentation

## 2015-11-30 DIAGNOSIS — Z8673 Personal history of transient ischemic attack (TIA), and cerebral infarction without residual deficits: Secondary | ICD-10-CM | POA: Diagnosis not present

## 2015-11-30 DIAGNOSIS — I351 Nonrheumatic aortic (valve) insufficiency: Secondary | ICD-10-CM | POA: Insufficient documentation

## 2015-11-30 DIAGNOSIS — W19XXXA Unspecified fall, initial encounter: Secondary | ICD-10-CM | POA: Insufficient documentation

## 2015-11-30 DIAGNOSIS — Y92129 Unspecified place in nursing home as the place of occurrence of the external cause: Secondary | ICD-10-CM | POA: Diagnosis not present

## 2015-11-30 DIAGNOSIS — I6523 Occlusion and stenosis of bilateral carotid arteries: Secondary | ICD-10-CM | POA: Insufficient documentation

## 2015-11-30 DIAGNOSIS — Z8546 Personal history of malignant neoplasm of prostate: Secondary | ICD-10-CM | POA: Diagnosis not present

## 2015-11-30 DIAGNOSIS — Z86711 Personal history of pulmonary embolism: Secondary | ICD-10-CM | POA: Diagnosis not present

## 2015-11-30 LAB — BASIC METABOLIC PANEL
Anion gap: 6 (ref 5–15)
BUN: 22 mg/dL — ABNORMAL HIGH (ref 6–20)
CALCIUM: 9.2 mg/dL (ref 8.9–10.3)
CO2: 29 mmol/L (ref 22–32)
CREATININE: 0.93 mg/dL (ref 0.61–1.24)
Chloride: 102 mmol/L (ref 101–111)
GLUCOSE: 110 mg/dL — AB (ref 65–99)
Potassium: 3.4 mmol/L — ABNORMAL LOW (ref 3.5–5.1)
Sodium: 137 mmol/L (ref 135–145)

## 2015-11-30 LAB — CBC
HCT: 37.2 % — ABNORMAL LOW (ref 40.0–52.0)
Hemoglobin: 12.2 g/dL — ABNORMAL LOW (ref 13.0–18.0)
MCH: 32.7 pg (ref 26.0–34.0)
MCHC: 32.8 g/dL (ref 32.0–36.0)
MCV: 99.5 fL (ref 80.0–100.0)
PLATELETS: 216 10*3/uL (ref 150–440)
RBC: 3.74 MIL/uL — ABNORMAL LOW (ref 4.40–5.90)
RDW: 14.8 % — AB (ref 11.5–14.5)
WBC: 8.6 10*3/uL (ref 3.8–10.6)

## 2015-11-30 LAB — TROPONIN I

## 2015-11-30 LAB — MRSA PCR SCREENING: MRSA by PCR: NEGATIVE

## 2015-11-30 LAB — TSH: TSH: 2.342 u[IU]/mL (ref 0.350–4.500)

## 2015-11-30 LAB — HEMOGLOBIN A1C: Hgb A1c MFr Bld: 5.2 % (ref 4.0–6.0)

## 2015-11-30 LAB — CK: Total CK: 25 U/L — ABNORMAL LOW (ref 49–397)

## 2015-11-30 MED ORDER — MELATONIN 3 MG PO TABS: 1.0000 | ORAL_TABLET | Freq: Every evening | ORAL | Status: DC | PRN

## 2015-11-30 MED ORDER — MEMANTINE HCL 10 MG PO TABS
10.0000 mg | ORAL_TABLET | Freq: Every day | ORAL | Status: DC
  Administered 2015-11-30 – 2015-12-01 (×2): 10 mg via ORAL
  Filled 2015-11-30 (×2): qty 1

## 2015-11-30 MED ORDER — TRAMADOL HCL 50 MG PO TABS
50.0000 mg | ORAL_TABLET | Freq: Three times a day (TID) | ORAL | Status: DC
  Administered 2015-11-30: 50 mg via ORAL
  Filled 2015-11-30: qty 1

## 2015-11-30 MED ORDER — TRAMADOL HCL 50 MG PO TABS: 50.0000 mg | ORAL_TABLET | Freq: Four times a day (QID) | ORAL | Status: DC | PRN

## 2015-11-30 MED ORDER — BENEPROTEIN PO POWD
6.0000 g | Freq: Two times a day (BID) | ORAL | Status: DC
  Administered 2015-11-30: 6 g via ORAL
  Filled 2015-11-30: qty 227

## 2015-11-30 MED ORDER — OXYCODONE HCL 5 MG PO TABS: 5.0000 mg | ORAL_TABLET | Freq: Four times a day (QID) | ORAL | Status: DC | PRN

## 2015-11-30 MED ORDER — POTASSIUM CHLORIDE 10 MEQ/100ML IV SOLN
10.0000 meq | INTRAVENOUS | Status: AC
  Administered 2015-11-30 (×4): 10 meq via INTRAVENOUS
  Filled 2015-11-30 (×4): qty 100

## 2015-11-30 MED ORDER — OXYCODONE HCL 5 MG PO TABS: 5.0000 mg | ORAL_TABLET | ORAL | Status: DC | PRN

## 2015-11-30 MED ORDER — ACETAMINOPHEN 325 MG PO TABS
650.0000 mg | ORAL_TABLET | ORAL | Status: DC | PRN
Start: 2015-11-30 — End: 2015-12-01

## 2015-11-30 MED ORDER — QUETIAPINE FUMARATE 25 MG PO TABS
25.0000 mg | ORAL_TABLET | Freq: Two times a day (BID) | ORAL | Status: DC
  Administered 2015-11-30 – 2015-12-01 (×3): 25 mg via ORAL
  Filled 2015-11-30 (×3): qty 1

## 2015-11-30 MED ORDER — SODIUM CHLORIDE 0.9 % IV SOLN
INTRAVENOUS | Status: DC
Start: 2015-11-30 — End: 2015-12-01
  Administered 2015-11-30 (×2): via INTRAVENOUS

## 2015-11-30 MED ORDER — DICLOFENAC SODIUM 1 % TD GEL
2.0000 g | Freq: Three times a day (TID) | TRANSDERMAL | Status: DC
  Administered 2015-11-30 – 2015-12-01 (×4): 2 g via TOPICAL
  Filled 2015-11-30: qty 100

## 2015-11-30 MED ORDER — CALCIUM CARBONATE ANTACID 500 MG PO CHEW
500.0000 mg | CHEWABLE_TABLET | Freq: Two times a day (BID) | ORAL | Status: DC
  Administered 2015-11-30 – 2015-12-01 (×3): 500 mg via ORAL
  Filled 2015-11-30: qty 2
  Filled 2015-11-30 (×2): qty 1
  Filled 2015-11-30: qty 2

## 2015-11-30 MED ORDER — ACETAMINOPHEN 500 MG PO TABS
500.0000 mg | ORAL_TABLET | Freq: Three times a day (TID) | ORAL | Status: DC
Start: 2015-11-30 — End: 2015-12-01
  Administered 2015-11-30 – 2015-12-01 (×4): 500 mg via ORAL
  Filled 2015-11-30 (×4): qty 1

## 2015-11-30 MED ORDER — VITAMIN D 1000 UNITS PO TABS
4000.0000 [IU] | ORAL_TABLET | Freq: Every day | ORAL | Status: DC
  Administered 2015-11-30 – 2015-12-01 (×2): 4000 [IU] via ORAL
  Filled 2015-11-30: qty 4

## 2015-11-30 MED ORDER — SENNA 8.6 MG PO TABS
2.0000 | ORAL_TABLET | Freq: Every day | ORAL | Status: DC
  Administered 2015-11-30 – 2015-12-01 (×2): 17.2 mg via ORAL
  Filled 2015-11-30 (×2): qty 2

## 2015-11-30 MED ORDER — DONEPEZIL HCL 5 MG PO TABS
10.0000 mg | ORAL_TABLET | Freq: Every day | ORAL | Status: DC
Start: 2015-11-30 — End: 2015-12-01
  Administered 2015-11-30 – 2015-12-01 (×2): 10 mg via ORAL
  Filled 2015-11-30 (×3): qty 2

## 2015-11-30 MED ORDER — METOPROLOL TARTRATE 25 MG PO TABS
12.5000 mg | ORAL_TABLET | Freq: Two times a day (BID) | ORAL | Status: DC
  Administered 2015-11-30 – 2015-12-01 (×3): 12.5 mg via ORAL
  Filled 2015-11-30 (×3): qty 1

## 2015-11-30 MED ORDER — ASPIRIN 81 MG PO CHEW
81.0000 mg | CHEWABLE_TABLET | Freq: Every day | ORAL | Status: DC
  Administered 2015-11-30 – 2015-12-01 (×2): 81 mg via ORAL
  Filled 2015-11-30 (×2): qty 1

## 2015-11-30 MED ORDER — IBUPROFEN 100 MG/5ML PO SUSP
400.0000 mg | Freq: Once | ORAL | Status: AC
  Administered 2015-11-30: 400 mg via ORAL
  Filled 2015-11-30: qty 20

## 2015-11-30 MED ORDER — SODIUM CHLORIDE 0.9% FLUSH
3.0000 mL | Freq: Two times a day (BID) | INTRAVENOUS | Status: DC
Start: 2015-11-30 — End: 2015-12-01
  Administered 2015-11-30 – 2015-12-01 (×2): 3 mL via INTRAVENOUS

## 2015-11-30 NOTE — Care Management Note (Signed)
Case Management Note  Patient Details  Name: INAKI VANTINE MRN: [id number] Date of Birth: 1924-02-24  Subjective/Objective:                  Met with patient and his oldest daughter Hassan Rowan [phone] to discuss discharge planning. Patient is from the [facility] and daughter states he is currently receiving PT at the facility but doesn't recall name of agency. Hassan Rowan states that she is HCPOA for patient. She states that [place] had a planned meeting today at the [facility] at [place].  Action/Plan: I have notified Santiago Glad with [facility] of patient admission and Brenda's request to meet with them here at [facility].CSW also following.    Expected Discharge Date:                  Expected Discharge Plan:     In-House Referral:     Discharge planning Services  CM Consult  Post Acute Care Choice:  Hospice Choice offered to:  Adult Children  DME Arranged:    DME Agency:     HH Arranged:    HH Agency:     Status of Service:  In process, will continue to follow  Medicare Important Message Given:    Date Medicare IM Given:    Medicare IM give by:    Date Additional Medicare IM Given:    Additional Medicare Important Message give by:     If discussed at [place] of Stay Meetings, dates discussed:    Additional Comments:  Marshell Garfinkel, RN 11/30/2015, 9:32 AM

## 2015-11-30 NOTE — Progress Notes (Signed)
[place] [facility] [place] NAME: Jorge Moore    MR#:  [id number]  DATE OF BIRTH:  07-Apr-1924  SUBJECTIVE:  CHIEF COMPLAINT:   Chief Complaint  Patient presents with  . Fall   - Patient admitted from [place] facility for falls twice in the 1 day -Not sure if there's any syncope - Alert and oriented and denies any complaints  REVIEW OF SYSTEMS:  Review of Systems  Constitutional: Negative for fever and chills.  HENT: Negative for ear discharge, ear pain and nosebleeds.   Eyes: Negative for blurred vision and double vision.  Respiratory: Negative for cough, shortness of breath and wheezing.   Cardiovascular: Negative for chest pain and palpitations.  Gastrointestinal: Negative for nausea, vomiting, abdominal pain, diarrhea and constipation.  Genitourinary: Negative for dysuria and urgency.  Musculoskeletal: Negative for myalgias.  Neurological: Negative for dizziness, tremors, sensory change, speech change, focal weakness, seizures, weakness and headaches.  Psychiatric/Behavioral: Negative for depression.    DRUG ALLERGIES:   Allergies  Allergen Reactions  . Penicillins Other (See Comments)    Reaction:  Unknown     VITALS:  Blood pressure 99/60, pulse 68, temperature 98.1 F (36.7 C), temperature source Oral, resp. rate 16, SpO2 100 %.  PHYSICAL EXAMINATION:  Physical Exam  GENERAL:  82 y.o.-year-old patient lying in the bed with no acute distress.  EYES: Pupils equal, round, reactive to light and accommodation. No scleral icterus. Extraocular muscles intact.  HEENT: Head atraumatic, normocephalic. Oropharynx and nasopharynx clear.  NECK:  Supple, no jugular venous distention. No thyroid enlargement, no tenderness.  LUNGS: Normal breath sounds bilaterally, no wheezing, rales,rhonchi or crepitation. No use of accessory muscles of respiration. Decreased bibasilar breath sounds CARDIOVASCULAR: S1, S2 normal. No   rubs, or gallops. 3/6 systolic murmur present ABDOMEN: Soft, nontender, nondistended. Bowel sounds present. No organomegaly or mass.  EXTREMITIES: No pedal edema, cyanosis, or clubbing.  NEUROLOGIC: Cranial nerves II through XII are intact. Muscle strength 5/5 in all extremities. Sensation intact. Gait not checked.  PSYCHIATRIC: The patient is alert and oriented x 2.  SKIN: No obvious rash, lesion, or ulcer.    LABORATORY PANEL:   CBC  Recent Labs Lab 11/30/15 0436  WBC 8.6  HGB 12.2*  HCT 37.2*  PLT 216   ------------------------------------------------------------------------------------------------------------------  Chemistries   Recent Labs Lab 11/30/15 0436  NA 137  K 3.4*  CL 102  CO2 29  GLUCOSE 110*  BUN 22*  CREATININE 0.93  CALCIUM 9.2   ------------------------------------------------------------------------------------------------------------------  Cardiac Enzymes  Recent Labs Lab 11/30/15 0436  TROPONINI <0.03   ------------------------------------------------------------------------------------------------------------------  RADIOLOGY:  Ct Head Wo Contrast  11/29/2015  CLINICAL DATA:  Neck pain after unwitnessed fall. Uncertain loss of consciousness. EXAM: CT HEAD WITHOUT CONTRAST CT CERVICAL SPINE WITHOUT CONTRAST TECHNIQUE: Multidetector CT imaging of the head and cervical spine was performed following the standard protocol without intravenous contrast. Multiplanar CT image reconstructions of the cervical spine were also generated. COMPARISON:  CT scan of November 03, 2015. FINDINGS: CT HEAD FINDINGS Mild chronic ischemic white matter disease is noted. Old left cerebellar infarction is noted. Old right basal ganglia is lacunar infarction is noted. Minimal diffuse cortical atrophy is noted. Bilateral tentorial calcifications are again noted and stable. No mass effect or midline shift is noted. Ventricular size is within normal limits. There is no  evidence of mass lesion, hemorrhage or acute infarction. CT CERVICAL SPINE FINDINGS Reversal of normal lordosis is noted. No fracture is  noted. Grade 1 anterolisthesis of C4-5 is noted secondary to posterior facet joint hypertrophy. Severe degenerative disc disease is noted at C5-6, C6-7 and C7-T1. Visualized lung apices are unremarkable. IMPRESSION: Mild chronic ischemic white matter disease. Minimal diffuse cortical atrophy. Old left cerebellar infarction. Old right basal ganglia infarction. No acute intracranial abnormality seen. Severe multilevel degenerative disc disease. No acute abnormality seen in the cervical spine. Electronically Signed   By: Marijo Conception, M.D.   On: 11/29/2015 20:58   Ct Cervical Spine Wo Contrast  11/29/2015  CLINICAL DATA:  Neck pain after unwitnessed fall. Uncertain loss of consciousness. EXAM: CT HEAD WITHOUT CONTRAST CT CERVICAL SPINE WITHOUT CONTRAST TECHNIQUE: Multidetector CT imaging of the head and cervical spine was performed following the standard protocol without intravenous contrast. Multiplanar CT image reconstructions of the cervical spine were also generated. COMPARISON:  CT scan of November 03, 2015. FINDINGS: CT HEAD FINDINGS Mild chronic ischemic white matter disease is noted. Old left cerebellar infarction is noted. Old right basal ganglia is lacunar infarction is noted. Minimal diffuse cortical atrophy is noted. Bilateral tentorial calcifications are again noted and stable. No mass effect or midline shift is noted. Ventricular size is within normal limits. There is no evidence of mass lesion, hemorrhage or acute infarction. CT CERVICAL SPINE FINDINGS Reversal of normal lordosis is noted. No fracture is noted. Grade 1 anterolisthesis of C4-5 is noted secondary to posterior facet joint hypertrophy. Severe degenerative disc disease is noted at C5-6, C6-7 and C7-T1. Visualized lung apices are unremarkable. IMPRESSION: Mild chronic ischemic white matter disease.  Minimal diffuse cortical atrophy. Old left cerebellar infarction. Old right basal ganglia infarction. No acute intracranial abnormality seen. Severe multilevel degenerative disc disease. No acute abnormality seen in the cervical spine. Electronically Signed   By: Marijo Conception, M.D.   On: 11/29/2015 20:58   Dg Hip Unilat With Pelvis 2-3 Views Left  11/29/2015  CLINICAL DATA:  Acute left hip pain after unwitnessed fall at nursing facility. EXAM: DG HIP (WITH OR WITHOUT PELVIS) 2-3V LEFT COMPARISON:  None. FINDINGS: Status post surgical internal fixation of old proximal left femoral fracture. No acute fracture or dislocation is noted. Severe degenerative joint disease of the left hip is noted. Large amount of stool is noted in the rectum consistent with impaction. IMPRESSION: Findings consistent with rectal impaction. Severe degenerative joint disease of the left hip. No fracture or dislocation is noted. Electronically Signed   By: Marijo Conception, M.D.   On: 11/29/2015 20:28    EKG:   Orders placed or performed during the hospital encounter of 11/30/15  . ED EKG within 10 minutes  . ED EKG within 10 minutes  . EKG 12-Lead  . EKG 12-Lead    ASSESSMENT AND PLAN:   89y/oM with PMH of Dementia, depression, GERD, prostrate cancer. Atrial fibrillation, ataxia admitted for falls and possible syncope  #1 Syncope- monitor on off unit tele Likely ataxia. And mechanical falls ECHO and carotid dopplers Physical therapy consult Likely might need long term care CT head and C spine with diffuse cortical atrophy, white matter disease and multilevel degenerative disc disease  #2 Chronic atrial fibrillation- rate controlled Cont metoprolol bid No anticoagulation due to falls risks  #3 Dementia- namenda, aricept and also seroquel  #4 DVT Prophylaxis- Lovenox  PT consult today   All the records are reviewed and case discussed with Care Management/Social Workerr. Management plans discussed with the  patient, family and they are in agreement.  CODE STATUS:  Full Code  TOTAL TIME TAKING CARE OF THIS PATIENT: 42 minutes.   POSSIBLE D/C IN 1 DAY, DEPENDING ON CLINICAL CONDITION.   Gladstone Lighter M.D on 11/30/2015 at 1:06 PM  Between 7am to 6pm - Pager - [phone]  After 6pm go to [email or web address] - password [email or web address] [facility]  [place] [facility]  Office  [phone]  CC: Primary care physician; Adrian Prows, MD

## 2015-11-30 NOTE — Progress Notes (Signed)
Visit made. Patient was to be admitted to [place] services at the [facility] today. He was admitted to [facility] following a fall. Writer notified by CSW Blima Rich of patient admission to [facility]. Hospice referral notified. Writer spoke via phone to patient's daughter Hassan Rowan, she has requested that fall mats and BSC be ordered. This was done by Probation officer. Current plan per Mel Almond CSW is for patient to return to the [facility] when ready for discharge. Thank you. Flo Shanks RN, BSN, [facility] Liaison [phone]

## 2015-11-30 NOTE — Clinical Social Work Note (Signed)
Clinical Social Work Assessment  Patient Details  Name: Jorge Moore MRN: [id number] Date of Birth: [birth year]  Date of referral:  11/30/15               Reason for consult:  Other (Comment Required) (From [facility] )                Permission sought to share information with:  Chartered certified accountant granted to share information::  Yes, Verbal Permission Granted  Name::      [facility]   Agency::     Relationship::     Contact Information:     Housing/Transportation Living arrangements for the past 2 months:  [place], [place] of Information:  [place], Adult Children Patient Interpreter Needed:  None Criminal Activity/Legal Involvement Pertinent to Current Situation/Hospitalization:  No - Comment as needed Significant Relationships:  Adult Children Lives with:  Facility Resident Do you feel safe going back to the place where you live?  Yes Need for family participation in patient care:  Yes (Comment)  Care giving concerns:  Patient is a resident at [facility].    Social Worker assessment / plan:  Holiday representative ([place]) reviewed chart and noted that patient is from [facility]. CSW is familiar with patient and family from previous admission. Patient was admitted to [facility] inpatient on 11/03/15 from [facility] for a fall and hip fracture. Patient had surgery and discharged from [facility] to [facility] on 11/07/15. Patient completed rehab at [facility] and went back to [facility] on 11/29/15. Patient fell twice within 24 hours of being at [facility] and was readmitted to [facility] under observation status on 11/30/15. Patient was scheduled to be open to [place] at [facility]. CSW met with patient and his 2 daughters Jorge Moore and Jorge Moore were at bedside this morning. CSW discussed D/C plan options with daughters. Patient is not appropriate for rehab and daughters prefer [place]/ [facility] care. CSW  explained that patient can go to SNF under private pay. Per daughters patient cannot pay privately for SNF and will not qualify for Medicaid. Per daughters patient only has the funds to pay privately for ALF level of care. 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   CSW spoke with Vaughan Basta and Mickel Baas at [facility]. Per Vaughan Basta and Mickel Baas patient is not service connected and is not eligible for the VA to pay for SNF level of care or any level of care. CSW made patient's daughter Jorge Moore aware of above. Daughters are in agreement for patient to return to [facility] under [place] CSW spoke to [employer] at [facility] who reported that patient can return with hospice care. CSW discussed with daughters hiring a private sitter to stay with patient at night to prevent falls. Sitter list was provided to patient's daughter Jorge Moore. Daughters reported that patient has the funds for a Charity fundraiser.  Plan is for patient to return to [facility] with A/C Hospice, private sitters hired by family, floor mates, and bed side commode. Hospice will provide equipment. [facility] liaison is aware of above.    Employment status:  Disabled (Comment on whether or not currently receiving Disability), Retired Forensic scientist:  Medicare PT Recommendations:  Not assessed at this time Information / Referral to community resources:  Other (Comment Required) (Patient will return to [facility] with A/C Hospice. )  Patient/Family's Response to care: Patient's daughters are agreeable for patient to return to [facility] with A/C Hospice.    Patient/Family's Understanding of and Emotional Response to Diagnosis, Current Treatment, and Prognosis: Patient's daughter is pleasant and thanked CSW for assistance.    Emotional Assessment Appearance:  Appears stated age Attitude/Demeanor/Rapport:  Unable to Assess Affect  (typically observed):  Unable to Assess Orientation:  Oriented to Self, Fluctuating Orientation (Suspected and/or reported Sundowners) Alcohol / Substance use:  Not Applicable Psych involvement (Current and /or in the community):  No (Comment)  Discharge Needs  Concerns to be addressed:  Discharge Planning Concerns Readmission within the last 30 days:  Yes Current discharge risk:  Chronically ill, Cognitively Impaired Barriers to Discharge:  Continued Medical Work up   Loralyn Freshwater, LCSW 11/30/2015, 2:28 PM

## 2015-11-30 NOTE — ED Notes (Signed)
Pt from the [facility] for unwitnessed fall. Pt was found on floor, alert & oriented to self, No injuries noted at this time. Pt c/o RT side neck pain.

## 2015-11-30 NOTE — ED Notes (Signed)
MD at bedside. 

## 2015-11-30 NOTE — Care Management Obs Status (Signed)
[place] NOTIFICATION   Patient Details  Name: Jorge Moore MRN: [id number] Date of Birth: [birth year]   Medicare Observation Status Notification Given:  Yes    Marshell Garfinkel, RN 11/30/2015, 9:34 AM

## 2015-11-30 NOTE — H&P (Signed)
Jorge Moore is an 82 y.o. male.   Chief Complaint: Fall HPI: The patient presents emergency department for the second time in 24 hours after a fall at his rehabilitation center. The patient was found down and unresponsive however vital signs were stable per nursing notes and EMS. In the emergency department the patient complains of no pain, shortness of breath, nausea, vomiting lightheadedness or dizziness. He has no recollection of falling. Past medical history is significant for dementia and atrial fibrillation. Due to concern for cardiogenic into the emergency department staff called for admission.   Past Medical History  Diagnosis Date  . Depressed   . Dementia   . GERD (gastroesophageal reflux disease)   . Prostate cancer ([place])   . Atrial fibrillation ([place])   . Ataxia   . PE (pulmonary embolism)     Past Surgical History  Procedure Laterality Date  . Intramedullary (im) nail intertrochanteric Right 11/04/2015    Procedure: INTRAMEDULLARY (IM) NAIL INTERTROCHANTRIC;  Surgeon: Dereck Leep, MD;  Location: [facility] ORS;  Service: Orthopedics;  Laterality: Right;    Family History  Problem Relation Age of Onset  . Colon cancer Mother   . Diabetes Father   . Diabetes Brother    Social History:  reports that he has quit smoking. He does not have any smokeless tobacco history on file. He reports that he does not drink alcohol. His drug history is not on file.  Allergies:  Allergies  Allergen Reactions  . Penicillins Other (See Comments)    Reaction:  Unknown     Medications Prior to Admission  Medication Sig Dispense Refill  . acetaminophen (TYLENOL) 500 MG tablet Take 500 mg by mouth 3 (three) times daily.    Marland Kitchen aspirin 81 MG chewable tablet Chew 81 mg by mouth daily. Reported on 11/30/2015    . Cholecalciferol (VITAMIN D) 2000 units tablet Take 4,000 Units by mouth daily.    . diclofenac sodium (VOLTAREN) 1 % GEL Apply 2 g topically 3 (three) times daily.    Marland Kitchen donepezil  (ARICEPT) 10 MG tablet Take 10 mg by mouth daily.     . Melatonin 3 MG TABS Take 1 tablet by mouth at bedtime as needed (sleep).     . memantine (NAMENDA) 10 MG tablet Take 10 mg by mouth daily.    . metoprolol tartrate (LOPRESSOR) 25 MG tablet Take 12.5 mg by mouth 2 (two) times daily.    Loma [place] (OYSTER CALCIUM) 500 MG TABS tablet Take 500 mg of elemental calcium by mouth 2 (two) times daily.    . protein supplement (RESOURCE BENEPROTEIN) POWD Take 6 g by mouth 2 (two) times daily.    . QUEtiapine (SEROQUEL) 25 MG tablet Take 25 mg by mouth 2 (two) times daily.    Marland Kitchen senna (SENOKOT) 8.6 MG TABS tablet Take 2 tablets by mouth daily.    . traMADol (ULTRAM) 50 MG tablet Take 1 tablet (50 mg total) by mouth every 4 (four) hours as needed for moderate pain. 30 tablet 1  . traMADol (ULTRAM) 50 MG tablet Take by mouth 3 (three) times daily.      Results for orders placed or performed during the hospital encounter of 11/30/15 (from the past 48 hour(s))  Basic metabolic panel     Status: Abnormal   Collection Time: 11/30/15  4:36 AM  Result Value Ref Range   Sodium 137 135 - 145 mmol/L   Potassium 3.4 (L) 3.5 - 5.1 mmol/L  Chloride 102 101 - 111 mmol/L   CO2 29 22 - 32 mmol/L   Glucose, Bld 110 (H) 65 - 99 mg/dL   BUN 22 (H) 6 - 20 mg/dL   Creatinine, Ser 0.93 0.61 - 1.24 mg/dL   Calcium 9.2 8.9 - 10.3 mg/dL   GFR calc non Af Amer >60 >60 mL/min   GFR calc Af Amer >60 >60 mL/min    Comment: (NOTE) The eGFR has been calculated using the CKD EPI equation. This calculation has not been validated in all clinical situations. eGFR's persistently <60 mL/min signify possible Chronic Kidney Disease.    Anion gap 6 5 - 15  CBC     Status: Abnormal   Collection Time: 11/30/15  4:36 AM  Result Value Ref Range   WBC 8.6 3.8 - 10.6 K/uL   RBC 3.74 (L) 4.40 - 5.90 MIL/uL   Hemoglobin 12.2 (L) 13.0 - 18.0 g/dL   HCT 37.2 (L) 40.0 - 52.0 %   MCV 99.5 80.0 - 100.0 fL   MCH 32.7 26.0 - 34.0 pg    MCHC 32.8 32.0 - 36.0 g/dL   RDW 14.8 (H) 11.5 - 14.5 %   Platelets 216 150 - 440 K/uL  Troponin I     Status: None   Collection Time: 11/30/15  4:36 AM  Result Value Ref Range   Troponin I <0.03 <0.031 ng/mL    Comment:        NO INDICATION OF MYOCARDIAL INJURY.    Ct Head Wo Contrast  11/29/2015  CLINICAL DATA:  Neck pain after unwitnessed fall. Uncertain loss of consciousness. EXAM: CT HEAD WITHOUT CONTRAST CT CERVICAL SPINE WITHOUT CONTRAST TECHNIQUE: Multidetector CT imaging of the head and cervical spine was performed following the standard protocol without intravenous contrast. Multiplanar CT image reconstructions of the cervical spine were also generated. COMPARISON:  CT scan of November 03, 2015. FINDINGS: CT HEAD FINDINGS Mild chronic ischemic white matter disease is noted. Old left cerebellar infarction is noted. Old right basal ganglia is lacunar infarction is noted. Minimal diffuse cortical atrophy is noted. Bilateral tentorial calcifications are again noted and stable. No mass effect or midline shift is noted. Ventricular size is within normal limits. There is no evidence of mass lesion, hemorrhage or acute infarction. CT CERVICAL SPINE FINDINGS Reversal of normal lordosis is noted. No fracture is noted. Grade 1 anterolisthesis of C4-5 is noted secondary to posterior facet joint hypertrophy. Severe degenerative disc disease is noted at C5-6, C6-7 and C7-T1. Visualized lung apices are unremarkable. IMPRESSION: Mild chronic ischemic white matter disease. Minimal diffuse cortical atrophy. Old left cerebellar infarction. Old right basal ganglia infarction. No acute intracranial abnormality seen. Severe multilevel degenerative disc disease. No acute abnormality seen in the cervical spine. Electronically Signed   By: Marijo Conception, M.D.   On: 11/29/2015 20:58   Ct Cervical Spine Wo Contrast  11/29/2015  CLINICAL DATA:  Neck pain after unwitnessed fall. Uncertain loss of consciousness.  EXAM: CT HEAD WITHOUT CONTRAST CT CERVICAL SPINE WITHOUT CONTRAST TECHNIQUE: Multidetector CT imaging of the head and cervical spine was performed following the standard protocol without intravenous contrast. Multiplanar CT image reconstructions of the cervical spine were also generated. COMPARISON:  CT scan of November 03, 2015. FINDINGS: CT HEAD FINDINGS Mild chronic ischemic white matter disease is noted. Old left cerebellar infarction is noted. Old right basal ganglia is lacunar infarction is noted. Minimal diffuse cortical atrophy is noted. Bilateral tentorial calcifications are again noted and stable. No  mass effect or midline shift is noted. Ventricular size is within normal limits. There is no evidence of mass lesion, hemorrhage or acute infarction. CT CERVICAL SPINE FINDINGS Reversal of normal lordosis is noted. No fracture is noted. Grade 1 anterolisthesis of C4-5 is noted secondary to posterior facet joint hypertrophy. Severe degenerative disc disease is noted at C5-6, C6-7 and C7-T1. Visualized lung apices are unremarkable. IMPRESSION: Mild chronic ischemic white matter disease. Minimal diffuse cortical atrophy. Old left cerebellar infarction. Old right basal ganglia infarction. No acute intracranial abnormality seen. Severe multilevel degenerative disc disease. No acute abnormality seen in the cervical spine. Electronically Signed   By: Marijo Conception, M.D.   On: 11/29/2015 20:58   Dg Hip Unilat With Pelvis 2-3 Views Left  11/29/2015  CLINICAL DATA:  Acute left hip pain after unwitnessed fall at nursing facility. EXAM: DG HIP (WITH OR WITHOUT PELVIS) 2-3V LEFT COMPARISON:  None. FINDINGS: Status post surgical internal fixation of old proximal left femoral fracture. No acute fracture or dislocation is noted. Severe degenerative joint disease of the left hip is noted. Large amount of stool is noted in the rectum consistent with impaction. IMPRESSION: Findings consistent with rectal impaction. Severe  degenerative joint disease of the left hip. No fracture or dislocation is noted. Electronically Signed   By: Marijo Conception, M.D.   On: 11/29/2015 20:28    Review of Systems  Constitutional: Negative for fever and chills.  HENT: Negative for sore throat and tinnitus.   Eyes: Negative for blurred vision and redness.  Respiratory: Negative for cough and shortness of breath.   Cardiovascular: Negative for chest pain, palpitations, orthopnea and PND.  Gastrointestinal: Negative for nausea, vomiting, abdominal pain and diarrhea.  Genitourinary: Negative for dysuria, urgency and frequency.  Musculoskeletal: Positive for falls. Negative for myalgias and joint pain.  Skin: Negative for rash.       No lesions  Neurological: Negative for speech change, focal weakness and weakness.  Endo/Heme/Allergies: Does not bruise/bleed easily.       No temperature intolerance  Psychiatric/Behavioral: Negative for depression and suicidal ideas.    Blood pressure 102/46, pulse 68, temperature 98.1 F (36.7 C), temperature source Oral, resp. rate 16, SpO2 100 %. Physical Exam  Nursing note and vitals reviewed. Constitutional: He is oriented to person, place, and time. He appears well-developed and well-nourished. No distress.  HENT:  Head: Normocephalic and atraumatic.  Mouth/Throat: Oropharynx is clear and moist.  Eyes: Conjunctivae and EOM are normal. Pupils are equal, round, and reactive to light. No scleral icterus.  Neck: Normal range of motion. Neck supple. No JVD present. No tracheal deviation present. No thyromegaly present.  Cardiovascular: Normal rate and regular rhythm.  Exam reveals no gallop and no friction rub.   No murmur heard. Respiratory: Effort normal and breath sounds normal. No respiratory distress.  GI: Bowel sounds are normal. He exhibits no distension. There is no tenderness.  Genitourinary:  Deferred  Musculoskeletal: Normal range of motion. He exhibits no edema.   Lymphadenopathy:    He has no cervical adenopathy.  Neurological: He is alert and oriented to person, place, and time. No cranial nerve deficit.  Skin: Skin is warm and dry. No rash noted. No erythema.  Psychiatric: He has a normal mood and affect. His behavior is normal. Judgment and thought content normal.     Assessment/Plan This is a 82 year old male admitted for syncope or near syncope. 1. Falls: Differential diagnosis includes syncope, ataxia, poor balance due to eyesight  or overall deconditioning. At this time there is no evidence of cardiogenic syncope. The patient certainly needs supervision when he is not in the hospital. His current residence is an assisted living facility but he will likely need skilled nursing care following discharge from the hospital. He carries a diagnosis of ataxia. These symptoms may represent worsening of said ataxia secondary to his dementia. 2. Chronic atrial fibrillation: Rate controlled; continue metoprolol.  No anticoagulation due to falls risks 3. Dementia: Continue donepezil and Namenda (Seroquel is also on the patient's med list but should be reconsidered according to DeBeer's criteria) 4. DVT prophylaxis: SCDs 5. GI prophylaxis: None The patient is a full code. Time spent on admission was inpatient care possibly 35 minutes   Harrie Foreman, MD 11/30/2015, 7:26 AM

## 2015-11-30 NOTE — ED Provider Notes (Signed)
[facility] Emergency Department Provider Note  ____________________________________________  Time seen: 4:45 AM  I have reviewed the triage vital signs and the nursing notes.   HISTORY  Chief Complaint Fall    HPI Jorge Moore is a 82 y.o. male with history of dementia, atrial fibrillation pulmonary emboli and ataxia returns to the emergency department following possible syncopal episode. Per EMS patient was found on the floor by staff at the [facility]. Patient denies any recollection of what caused him to fall. He denies any chest pain no shortness of breath headache dizziness weakness or numbness. Patient denies any dyspnea. Of note patient was seen at 11:12 PM last night in the emergency department following a fall. Patient denies any symptoms at this time     Past Medical History  Diagnosis Date  . Depressed   . Dementia   . GERD (gastroesophageal reflux disease)   . Prostate cancer ([place])   . Atrial fibrillation ([place])   . Ataxia   . PE (pulmonary embolism)     Patient Active Problem List   Diagnosis Date Noted  . Syncope 11/30/2015  . Atrial fibrillation ([place]) 11/11/2015  . Degeneration of intervertebral disc of lumbosacral region 11/11/2015  . Atheroma, skin 11/11/2015  . Hip fracture, right ([place]) 11/03/2015  . Hip fracture ([place]) 11/03/2015  . Cerebral ventriculomegaly 07/22/2015  . Abnormal computed tomography of head 05/25/2015  . Fall 10/05/2014  . Appendicular ataxia 10/05/2014  . 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 Pulmonary embolism ([place]) 11/16/2010    Past Surgical History  Procedure Laterality Date  . Intramedullary (im) nail intertrochanteric Right 11/04/2015    Procedure: INTRAMEDULLARY (IM) NAIL INTERTROCHANTRIC;  Surgeon: Dereck Leep, MD;  Location: [facility] ORS;  Service: Orthopedics;  Laterality: Right;    Current Outpatient Rx  Name  Route  Sig  Dispense  Refill  . acetaminophen (TYLENOL) 500 MG  tablet   Oral   Take 500 mg by mouth 3 (three) times daily.         Marland Kitchen aspirin 81 MG chewable tablet   Oral   Chew 81 mg by mouth daily. Reported on 11/30/2015         . Cholecalciferol (VITAMIN D) 2000 units tablet   Oral   Take 4,000 Units by mouth daily.         . diclofenac sodium (VOLTAREN) 1 % GEL   Topical   Apply 2 g topically 3 (three) times daily.         Marland Kitchen donepezil (ARICEPT) 10 MG tablet   Oral   Take 10 mg by mouth daily.          . Melatonin 3 MG TABS   Oral   Take 1 tablet by mouth at bedtime as needed (sleep).          . memantine (NAMENDA) 10 MG tablet   Oral   Take 10 mg by mouth daily.         . metoprolol tartrate (LOPRESSOR) 25 MG tablet   Oral   Take 12.5 mg by mouth 2 (two) times daily.         Loma [place] (OYSTER CALCIUM) 500 MG TABS tablet   Oral   Take 500 mg of elemental calcium by mouth 2 (two) times daily.         . protein supplement (RESOURCE BENEPROTEIN) POWD   Oral   Take 6 g by mouth 2 (two) times daily.         Marland Kitchen  QUEtiapine (SEROQUEL) 25 MG tablet   Oral   Take 25 mg by mouth 2 (two) times daily.         Marland Kitchen senna (SENOKOT) 8.6 MG TABS tablet   Oral   Take 2 tablets by mouth daily.         . traMADol (ULTRAM) 50 MG tablet   Oral   Take 1 tablet (50 mg total) by mouth every 4 (four) hours as needed for moderate pain.   30 tablet   1   . traMADol (ULTRAM) 50 MG tablet   Oral   Take by mouth 3 (three) times daily.           Allergies Penicillins  Family History  Problem Relation Age of Onset  . Colon cancer Mother   . Diabetes Father   . Diabetes Brother     Social History Social History  Substance Use Topics  . Smoking status: Former Research scientist (life sciences)  . Smokeless tobacco: None  . Alcohol Use: No    Review of Systems  Constitutional: Negative for fever. Eyes: Negative for visual changes. ENT: Negative for sore throat. Cardiovascular: Negative for chest pain. Respiratory: Negative for  shortness of breath. Gastrointestinal: Negative for abdominal pain, vomiting and diarrhea. Genitourinary: Negative for dysuria. Musculoskeletal: Negative for back pain. Skin: Negative for rash. Neurological: Negative for headaches, focal weakness or numbness.   10-point ROS otherwise negative.  ____________________________________________   PHYSICAL EXAM:  VITAL SIGNS: ED Triage Vitals  Enc Vitals Group     BP 11/30/15 0347 141/73 mmHg     Pulse Rate 11/30/15 0347 77     Resp 11/30/15 0347 14     Temp 11/30/15 0347 97.5 F (36.4 C)     Temp Source 11/30/15 0347 Oral     SpO2 11/30/15 0347 93 %     Weight --      Height --      Head Cir --      Peak Flow --      Pain Score --      Pain Loc --      Pain Edu? --      Excl. in [place]? --      Constitutional: Alert and oriented. Well appearing and in no distress. Eyes: Conjunctivae are normal. PERRL. Normal extraocular movements. ENT   Head: Normocephalic and atraumatic.   Nose: No congestion/rhinnorhea.   Mouth/Throat: Mucous membranes are moist.   Neck: No stridor. Hematological/Lymphatic/Immunilogical: No cervical lymphadenopathy. Cardiovascular: Normal rate, regular rhythm. Normal and symmetric distal pulses are present in all extremities. No murmurs, rubs, or gallops. Respiratory: Normal respiratory effort without tachypnea nor retractions. Breath sounds are clear and equal bilaterally. No wheezes/rales/rhonchi. Gastrointestinal: Soft and nontender. No distention. There is no CVA tenderness. Genitourinary: deferred Musculoskeletal: Nontender with normal range of motion in all extremities. No joint effusions.  No lower extremity tenderness nor edema. Neurologic:  Normal speech and language. No gross focal neurologic deficits are appreciated. Speech is normal.  Skin:  Skin is warm, dry and intact. No rash noted. Psychiatric: Mood and affect are normal. Speech and behavior are normal. Patient exhibits  appropriate insight and judgment.  ____________________________________________    LABS (pertinent positives/negatives)  Labs Reviewed  BASIC METABOLIC PANEL - Abnormal; Notable for the following:    Potassium 3.4 (*)    Glucose, Bld 110 (*)    BUN 22 (*)    All other components within normal limits  CBC - Abnormal; Notable for the following:  RBC 3.74 (*)    Hemoglobin 12.2 (*)    HCT 37.2 (*)    RDW 14.8 (*)    All other components within normal limits  TROPONIN I     ____________________________________________   EKG  ED ECG REPORT I, Kline N BROWN, the attending physician, personally viewed and interpreted this ECG.   Date: 11/30/2015  EKG Time: 3:47 AM  Rate: 80  Rhythm: Normal sinus rhythm  Axis: Normal  Intervals: Normal  ST&T Change: None     INITIAL IMPRESSION / ASSESSMENT AND PLAN / ED COURSE  Pertinent labs & imaging results that were available during my care of the patient were reviewed by me and considered in my medical decision making (see chart for details).  History of physical exam concern for possible syncopal episode. Patient without any symptoms at this time. Patient discussed with Dr. Marcille Blanco hospitalist for hospital admission for further evaluation and management  ____________________________________________   FINAL CLINICAL IMPRESSION(S) / ED DIAGNOSES  Final diagnoses:  Syncope, unspecified syncope type      Gregor Hams, MD 11/30/15 [phone]

## 2015-11-30 NOTE — Progress Notes (Signed)
PT Cancellation Note  Patient Details Name: Jorge Moore MRN: [id number] DOB: Feb 04, 1924   Cancelled Treatment:    Reason Eval/Treat Not Completed: Other (comment). Pt currently out of room. Will re-attempt next date.   Julio Storr 11/30/2015, 4:34 PM Greggory Stallion, PT, DPT [phone]

## 2015-11-30 NOTE — NC FL2 (Signed)
[place] LEVEL OF CARE SCREENING TOOL     IDENTIFICATION  Patient Name: Jorge Moore Birthdate: 03-04-1924 Sex: male Admission Date (Current Location): 11/30/2015  [facility] and [place] Number:  Engineering geologist and Address:  [facility], [address] [place], [place] [address]      Provider Number: [id number]  Attending Physician Name and Address:  Gladstone Lighter, MD  Relative Name and Phone Number:       Current Level of Care: Hospital Recommended Level of Care: [place] Prior Approval Number:    Date Approved/Denied:   PASRR Number:  ( [id number] )  Discharge Plan: Domiciliary (Rest home)    Current Diagnoses: Patient Active Problem List   Diagnosis Date Noted  . Syncope 11/30/2015  . Atrial fibrillation ([place]) 11/11/2015  . Degeneration of intervertebral disc of lumbosacral region 11/11/2015  . Atheroma, skin 11/11/2015  . Hip fracture, right ([place]) 11/03/2015  . Hip fracture ([place]) 11/03/2015  . Cerebral ventriculomegaly 07/22/2015  . Abnormal computed tomography of head 05/25/2015  . Fall 10/05/2014  . Appendicular ataxia 10/05/2014  . 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 Pulmonary embolism ([place]) 11/16/2010    Orientation RESPIRATION BLADDER Height & Weight     Self  Normal Continent Weight:   Height:     BEHAVIORAL SYMPTOMS/MOOD NEUROLOGICAL BOWEL NUTRITION STATUS   (none )  (none ) Continent Diet (Regular Diet )  AMBULATORY STATUS COMMUNICATION OF NEEDS Skin   Limited Assist Verbally Normal                       Personal Care Assistance Level of Assistance  Bathing, Feeding, Dressing Bathing Assistance: Limited assistance Feeding assistance: Independent Dressing Assistance: Limited assistance     Functional Limitations Info  Sight, Hearing, Speech Sight Info: Adequate Hearing Info: Impaired Speech Info: Adequate    SPECIAL CARE FACTORS FREQUENCY                       Contractures      Additional Factors Info  Code Status, Allergies Code Status Info:  (Full Code. ) Allergies Info:  (Penicillins)           Current Medications (11/30/2015):  This is the current hospital active medication list Current Facility-Administered Medications  Medication Dose Route Frequency Provider Last Rate Last Dose  . 0.9 %  sodium chloride infusion   Intravenous Continuous Harrie Foreman, MD      . acetaminophen (TYLENOL) tablet 500 mg  500 mg Oral TID Harrie Foreman, MD   500 mg at 11/30/15 0946  . acetaminophen (TYLENOL) tablet 650 mg  650 mg Oral Q4H PRN Harrie Foreman, MD      . aspirin chewable tablet 81 mg  81 mg Oral Daily Harrie Foreman, MD   81 mg at 11/30/15 0945  . calcium carbonate (TUMS - dosed in mg elemental calcium) chewable tablet 500 mg of elemental calcium  500 mg of elemental calcium Oral BID Harrie Foreman, MD   500 mg of elemental calcium at 11/30/15 0946  . cholecalciferol (VITAMIN D) tablet 4,000 Units  4,000 Units Oral Daily Harrie Foreman, MD   4,000 Units at 11/30/15 1102  . diclofenac sodium (VOLTAREN) 1 % transdermal gel 2 g  2 g Topical TID Harrie Foreman, MD   2 g at 11/30/15 1006  . donepezil (ARICEPT) tablet 10 mg  10 mg  Oral Daily Harrie Foreman, MD   10 mg at 11/30/15 0945  . memantine (NAMENDA) tablet 10 mg  10 mg Oral Daily Harrie Foreman, MD   10 mg at 11/30/15 0949  . metoprolol tartrate (LOPRESSOR) tablet 12.5 mg  12.5 mg Oral BID Harrie Foreman, MD   12.5 mg at 11/30/15 1005  . oxyCODONE (Oxy IR/ROXICODONE) immediate release tablet 5-10 mg  5-10 mg Oral Q4H PRN Harrie Foreman, MD      . protein supplement (RESOURCE BENEPROTEIN) powder 6 g  6 g Oral BID Harrie Foreman, MD   6 g at 11/30/15 1102  . QUEtiapine (SEROQUEL) tablet 25 mg  25 mg Oral BID Harrie Foreman, MD   25 mg at 11/30/15 0945  . senna (SENOKOT) tablet 17.2 mg  2 tablet Oral Daily Harrie Foreman, MD   17.2  mg at 11/30/15 0945  . sodium chloride flush (NS) 0.9 % injection 3 mL  3 mL Intravenous Q12H Harrie Foreman, MD   3 mL at 11/30/15 0948  . traMADol (ULTRAM) tablet 50 mg  50 mg Oral TID Harrie Foreman, MD   50 mg at 11/30/15 0945     Discharge Medications: Please see discharge summary for a list of discharge medications.  Relevant Imaging Results:  Relevant Lab Results:   Additional Information  (SSN: [id number] )  Loralyn Freshwater, LCSW

## 2015-12-01 ENCOUNTER — Observation Stay (HOSPITAL_BASED_OUTPATIENT_CLINIC_OR_DEPARTMENT_OTHER)
Admit: 2015-12-01 | Discharge: 2015-12-01 | Disposition: A | Discharge status: Home / Self Care | Payer: Medicare Other | Attending: Internal Medicine | Admitting: Internal Medicine

## 2015-12-01 DIAGNOSIS — R55 Syncope and collapse: Secondary | ICD-10-CM | POA: Diagnosis not present

## 2015-12-01 LAB — ECHOCARDIOGRAM COMPLETE
Height: 64 in
Weight: 2079.52 oz

## 2015-12-01 LAB — BASIC METABOLIC PANEL
ANION GAP: 4 — AB (ref 5–15)
BUN: 19 mg/dL (ref 6–20)
CALCIUM: 8.7 mg/dL — AB (ref 8.9–10.3)
CHLORIDE: 109 mmol/L (ref 101–111)
CO2: 26 mmol/L (ref 22–32)
CREATININE: 0.85 mg/dL (ref 0.61–1.24)
GFR calc Af Amer: >60 mL/min (ref >60)
GFR calc non Af Amer: >60 mL/min (ref >60)
Glucose, Bld: 83 mg/dL (ref 65–99)
POTASSIUM: 3.7 mmol/L (ref 3.5–5.1)
SODIUM: 139 mmol/L (ref 135–145)

## 2015-12-01 MED ORDER — TRAMADOL HCL 50 MG PO TABS: 50.0000 mg | ORAL_TABLET | Freq: Four times a day (QID) | ORAL | Status: DC | PRN

## 2015-12-01 MED ORDER — SODIUM CHLORIDE 0.9 % IV BOLUS (SEPSIS)
500.0000 mL | Freq: Once | INTRAVENOUS | Status: AC
  Administered 2015-12-01: 500 mL via INTRAVENOUS

## 2015-12-01 NOTE — Discharge Summary (Signed)
[facility] [place] NAME: Jorge Moore    MR#:  [id number]  DATE OF BIRTH:  10/03/1923  DATE OF ADMISSION:  11/30/2015 ADMITTING PHYSICIAN: Harrie Foreman, MD  DATE OF DISCHARGE: 12/01/2015  PRIMARY CARE PHYSICIAN: Adrian Prows, MD    ADMISSION DIAGNOSIS:  Syncope, unspecified syncope type [R55]  DISCHARGE DIAGNOSIS:  Active Problems:   Syncope   SECONDARY DIAGNOSIS:   Past Medical History  Diagnosis Date  . Depressed   . Dementia   . GERD (gastroesophageal reflux disease)   . Prostate cancer ([place])   . Atrial fibrillation ([place])   . Ataxia   . PE (pulmonary embolism)     HOSPITAL COURSE:   82y/oM with PMH of Dementia, depression, GERD, prostrate cancer. Atrial fibrillation, ataxia admitted for falls and possible syncope  #1 falls at the assisted living facility-likely mechanical as patient was trying to get out of his geriatric chair. -No syncope associated. Likely ataxia. And mechanical falls ECHO done and pending and carotid dopplers with mild right ICA stenosis and moderate left ICA stenosis. Physical therapy consulted CT head and C spine with diffuse cortical atrophy, white matter disease and multilevel degenerative disc disease -Blood pressure is low normal, IV fluids helped. Patient is not symptomatic. -Discontinue his low-dose metoprolol  #2 Chronic atrial fibrillation- rate controlled Metoprolol discontinued due to low normal blood pressure. Echo is pending. No anticoagulation due to falls risks-only on low-dose aspirin Currently heart rate is in the 60s  #3 Dementia- namenda, aricept and also seroquel Hospice services. Sitter at bedside to avoid falls.  Patient will be discharged back to assisted living facility with hospice today  DISCHARGE CONDITIONS:   Guarded  CONSULTS OBTAINED:    none  DRUG ALLERGIES:   Allergies  Allergen Reactions  . Penicillins Other (See Comments)   Reaction:  Unknown     DISCHARGE MEDICATIONS:   Current Discharge Medication List    CONTINUE these medications which have CHANGED   Details  traMADol (ULTRAM) 50 MG tablet Take 1 tablet (50 mg total) by mouth every 6 (six) hours as needed for moderate pain. Qty: 30 tablet, Refills: 0      CONTINUE these medications which have NOT CHANGED   Details  acetaminophen (TYLENOL) 500 MG tablet Take 500 mg by mouth 3 (three) times daily.    aspirin 81 MG chewable tablet Chew 81 mg by mouth daily. Reported on 11/30/2015    Cholecalciferol (VITAMIN D) 2000 units tablet Take 4,000 Units by mouth daily.    diclofenac sodium (VOLTAREN) 1 % GEL Apply 2 g topically 3 (three) times daily.    donepezil (ARICEPT) 10 MG tablet Take 10 mg by mouth daily.     Melatonin 3 MG TABS Take 1 tablet by mouth at bedtime as needed (sleep).     memantine (NAMENDA) 10 MG tablet Take 10 mg by mouth daily.    Oyster Shell (OYSTER CALCIUM) 500 MG TABS tablet Take 500 mg of elemental calcium by mouth 2 (two) times daily.    protein supplement (RESOURCE BENEPROTEIN) POWD Take 6 g by mouth 2 (two) times daily.    QUEtiapine (SEROQUEL) 25 MG tablet Take 25 mg by mouth 2 (two) times daily.    senna (SENOKOT) 8.6 MG TABS tablet Take 2 tablets by mouth daily.      STOP taking these medications     metoprolol tartrate (LOPRESSOR) 25 MG tablet      oxyCODONE (OXY IR/ROXICODONE) 5  MG immediate release tablet          DISCHARGE INSTRUCTIONS:   1. PCP follow-up in 1-2 weeks 2. Resume hospice services  If you experience worsening of your admission symptoms, develop shortness of breath, life threatening emergency, suicidal or homicidal thoughts you must seek medical attention immediately by calling 911 or calling your MD immediately  if symptoms less severe.  You Must read complete instructions/literature along with all the possible adverse reactions/side effects for all the Medicines you take and that have  been prescribed to you. Take any new Medicines after you have completely understood and accept all the possible adverse reactions/side effects.   Please note  You were cared for by a hospitalist during your hospital stay. If you have any questions about your discharge medications or the care you received while you were in the hospital after you are discharged, you can call the unit and asked to speak with the hospitalist on call if the hospitalist that took care of you is not available. Once you are discharged, your primary care physician will handle any further medical issues. Please note that NO REFILLS for any discharge medications will be authorized once you are discharged, as it is imperative that you return to your primary care physician (or establish a relationship with a primary care physician if you do not have one) for your aftercare needs so that they can reassess your need for medications and monitor your lab values.    Today   CHIEF COMPLAINT:   Chief Complaint  Patient presents with  . Fall     VITAL SIGNS:  Blood pressure 88/47, pulse 69, temperature 98 F (36.7 C), temperature source Oral, resp. rate 18, height 5\' 4"  (1.626 m), weight 58.954 kg (129 lb 15.5 oz), SpO2 100 %.  I/O:   Intake/Output Summary (Last 24 hours) at 12/01/15 1256 Last data filed at 12/01/15 0535  Gross per 24 hour  Intake 2898.34 ml  Output      0 ml  Net 2898.34 ml    PHYSICAL EXAMINATION:   Physical Exam  GENERAL: 82 y.o.-year-old patient lying in the bed with no acute distress.  EYES: Pupils equal, round, reactive to light and accommodation. No scleral icterus. Extraocular muscles intact.  HEENT: Head atraumatic, normocephalic. Oropharynx and nasopharynx clear.  NECK: Supple, no jugular venous distention. No thyroid enlargement, no tenderness.  LUNGS: Normal breath sounds bilaterally, no wheezing, rales,rhonchi or crepitation. No use of accessory muscles of respiration. Decreased  bibasilar breath sounds CARDIOVASCULAR: S1, S2 normal. No rubs, or gallops. 3/6 systolic murmur present ABDOMEN: Soft, nontender, nondistended. Bowel sounds present. No organomegaly or mass.  EXTREMITIES: No pedal edema, cyanosis, or clubbing.  NEUROLOGIC: Cranial nerves II through XII are intact. Muscle strength 5/5 in all extremities. Sensation intact. Gait not checked. Following simple commands PSYCHIATRIC: The patient is alert and oriented to self.  SKIN: No obvious rash, lesion, or ulcer.   DATA REVIEW:   CBC  Recent Labs Lab 11/30/15 0436  WBC 8.6  HGB 12.2*  HCT 37.2*  PLT 216    Chemistries   Recent Labs Lab 12/01/15 0415  NA 139  K 3.7  CL 109  CO2 26  GLUCOSE 83  BUN 19  CREATININE 0.85  CALCIUM 8.7*    Cardiac Enzymes  Recent Labs Lab 11/30/15 0436  TROPONINI <0.03    Microbiology Results  Results for orders placed or performed during the hospital encounter of 11/30/15  MRSA PCR Screening  Status: None   Collection Time: 11/30/15 10:57 AM  Result Value Ref Range Status   MRSA by PCR NEGATIVE NEGATIVE Final    Comment:        The GeneXpert MRSA Assay (FDA approved for NASAL specimens only), is one component of a comprehensive MRSA colonization surveillance program. It is not intended to diagnose MRSA infection nor to guide or monitor treatment for MRSA infections.     RADIOLOGY:  Ct Head Wo Contrast  11/29/2015  CLINICAL DATA:  Neck pain after unwitnessed fall. Uncertain loss of consciousness. EXAM: CT HEAD WITHOUT CONTRAST CT CERVICAL SPINE WITHOUT CONTRAST TECHNIQUE: Multidetector CT imaging of the head and cervical spine was performed following the standard protocol without intravenous contrast. Multiplanar CT image reconstructions of the cervical spine were also generated. COMPARISON:  CT scan of November 03, 2015. FINDINGS: CT HEAD FINDINGS Mild chronic ischemic white matter disease is noted. Old left cerebellar infarction is  noted. Old right basal ganglia is lacunar infarction is noted. Minimal diffuse cortical atrophy is noted. Bilateral tentorial calcifications are again noted and stable. No mass effect or midline shift is noted. Ventricular size is within normal limits. There is no evidence of mass lesion, hemorrhage or acute infarction. CT CERVICAL SPINE FINDINGS Reversal of normal lordosis is noted. No fracture is noted. Grade 1 anterolisthesis of C4-5 is noted secondary to posterior facet joint hypertrophy. Severe degenerative disc disease is noted at C5-6, C6-7 and C7-T1. Visualized lung apices are unremarkable. IMPRESSION: Mild chronic ischemic white matter disease. Minimal diffuse cortical atrophy. Old left cerebellar infarction. Old right basal ganglia infarction. No acute intracranial abnormality seen. Severe multilevel degenerative disc disease. No acute abnormality seen in the cervical spine. Electronically Signed   By: Marijo Conception, M.D.   On: 11/29/2015 20:58   Ct Cervical Spine Wo Contrast  11/29/2015  CLINICAL DATA:  Neck pain after unwitnessed fall. Uncertain loss of consciousness. EXAM: CT HEAD WITHOUT CONTRAST CT CERVICAL SPINE WITHOUT CONTRAST TECHNIQUE: Multidetector CT imaging of the head and cervical spine was performed following the standard protocol without intravenous contrast. Multiplanar CT image reconstructions of the cervical spine were also generated. COMPARISON:  CT scan of November 03, 2015. FINDINGS: CT HEAD FINDINGS Mild chronic ischemic white matter disease is noted. Old left cerebellar infarction is noted. Old right basal ganglia is lacunar infarction is noted. Minimal diffuse cortical atrophy is noted. Bilateral tentorial calcifications are again noted and stable. No mass effect or midline shift is noted. Ventricular size is within normal limits. There is no evidence of mass lesion, hemorrhage or acute infarction. CT CERVICAL SPINE FINDINGS Reversal of normal lordosis is noted. No fracture is  noted. Grade 1 anterolisthesis of C4-5 is noted secondary to posterior facet joint hypertrophy. Severe degenerative disc disease is noted at C5-6, C6-7 and C7-T1. Visualized lung apices are unremarkable. IMPRESSION: Mild chronic ischemic white matter disease. Minimal diffuse cortical atrophy. Old left cerebellar infarction. Old right basal ganglia infarction. No acute intracranial abnormality seen. Severe multilevel degenerative disc disease. No acute abnormality seen in the cervical spine. Electronically Signed   By: Marijo Conception, M.D.   On: 11/29/2015 20:58   US Carotid Bilateral  11/30/2015  CLINICAL DATA:  82 year old male with syncope EXAM: BILATERAL CAROTID DUPLEX ULTRASOUND TECHNIQUE: Pearline Cables scale imaging, color Doppler and duplex ultrasound were performed of bilateral carotid and vertebral arteries in the neck. COMPARISON:  CT scan of the head and cervical spine 11/29/2015 FINDINGS: Criteria: Quantification of carotid stenosis is based on  velocity parameters that correlate the residual internal carotid diameter with NASCET-based stenosis levels, using the diameter of the distal internal carotid lumen as the denominator for stenosis measurement. The following velocity measurements were obtained: RIGHT ICA:  79/21 cm/sec CCA:  123456 cm/sec SYSTOLIC ICA/CCA RATIO:  0.7 DIASTOLIC ICA/CCA RATIO:  1.2 ECA:  48 cm/sec LEFT ICA:  134/33 cm/sec CCA:  123456 cm/sec SYSTOLIC ICA/CCA RATIO:  1.8 DIASTOLIC ICA/CCA RATIO:  3.1 ECA:  183 cm/sec RIGHT CAROTID ARTERY: Heterogeneous atherosclerotic plaque in the common carotid artery without definitive stenosis. Plaque extends into the proximal internal carotid artery. By peak systolic velocity criteria the estimated stenosis remains less than 50%. RIGHT VERTEBRAL ARTERY:  Patent with normal antegrade flow. LEFT CAROTID ARTERY: Heterogeneous atherosclerotic plaque throughout the common carotid artery extending into the proximal internal carotid artery. By peak systolic  velocity criteria the estimated stenosis falls into the 50- 69% range. Of note, the plaque is irregular. LEFT VERTEBRAL ARTERY:  Patent with antegrade flow. IMPRESSION: 1. Mild (1-49%) stenosis proximal right internal carotid artery secondary to heterogeneous atherosclerotic plaque. 2. Moderate (50- 69%) stenosis proximal left internal carotid artery secondary to heterogeneous and irregular atherosclerotic plaque. 3. Vertebral arteries are patent with normal antegrade flow. Signed, Criselda Peaches, MD [facility] Radiology Electronically Signed   By: Jacqulynn Cadet M.D.   On: 11/30/2015 17:10   Dg Hip Unilat With Pelvis 2-3 Views Left  11/29/2015  CLINICAL DATA:  Acute left hip pain after unwitnessed fall at nursing facility. EXAM: DG HIP (WITH OR WITHOUT PELVIS) 2-3V LEFT COMPARISON:  None. FINDINGS: Status post surgical internal fixation of old proximal left femoral fracture. No acute fracture or dislocation is noted. Severe degenerative joint disease of the left hip is noted. Large amount of stool is noted in the rectum consistent with impaction. IMPRESSION: Findings consistent with rectal impaction. Severe degenerative joint disease of the left hip. No fracture or dislocation is noted. Electronically Signed   By: Marijo Conception, M.D.   On: 11/29/2015 20:28    EKG:   Orders placed or performed during the hospital encounter of 11/30/15  . ED EKG within 10 minutes  . ED EKG within 10 minutes  . EKG 12-Lead  . EKG 12-Lead      Management plans discussed with the patient, family and they are in agreement.  CODE STATUS: Discussed with daughters at bedside prior to discharge. They wished to keep him full code at this time.    Code Status Orders        Start     Ordered   11/30/15 0644  Full code   Continuous     11/30/15 0643    Code Status History    Date Active Date Inactive Code Status Order ID Comments User Context   11/03/2015  10:22 PM 11/04/2015  1:17 PM Full Code [id number]  Vaughan Basta, MD Inpatient   11/03/2015 10:16 PM 11/03/2015 10:22 PM Full Code [id number]  Dereck Leep, MD ED      TOTAL TIME TAKING CARE OF THIS PATIENT: 37 minutes.    Gladstone Lighter M.D on 12/01/2015 at 12:56 PM  Between 7am to 6pm - Pager - [phone]  After 6pm go to [email or web address] - password [email or web address] [facility]  [facility]  Office  [phone]  CC: Primary care physician; Adrian Prows, MD

## 2015-12-01 NOTE — Progress Notes (Signed)
Follow up visit made to new referral for hospice of [facility] services at [facility] following discharge. Patient seen lying in bed, awakened to voice. Patient  attempted to get out of bed, redirected by writer, he did remain  in bed. Daughter Judeth Porch at bedside. Plan is for patient to discharge back to the [facility] via EMS with hospice services. Discharge summary and Physical Therapy note faxed to referral intake. Hospice team updated.  Flo Shanks RN, BSN, [facility] [place], [facility] [phone] c

## 2015-12-01 NOTE — Progress Notes (Signed)
Patient is medically stable for D/C to [facility] today. Per Kieth Brightly at [facility] patient can return today. Clinical Education officer, museum (CSW) prepared D/C packet and faxed D/C Summary, FL2 and prescriptions to [facility]. RN will arrange EMS for transport. Patient's daughter Judeth Porch is at bedside and aware of D/C today. [facility] liaison is aware of above. Please reconsult if future social work needs arise. CSW signing off.   Blima Rich, LCSW [phone]

## 2015-12-01 NOTE — Progress Notes (Signed)
*  PRELIMINARY RESULTS* Echocardiogram 2D Echocardiogram has been performed.  Jorge Moore 12/01/2015, 8:06 AM

## 2015-12-01 NOTE — NC FL2 (Signed)
[place] LEVEL OF CARE SCREENING TOOL     IDENTIFICATION  Patient Name: Jorge Moore Birthdate: September 10, 1924 Sex: male Admission Date (Current Location): 11/30/2015  [facility] and [place] Number:  Engineering geologist and Address:  [facility], [address], [place], [place] [address]      Provider Number: [id number]  Attending Physician Name and Address:  Gladstone Lighter, MD  Relative Name and Phone Number:       Current Level of Care: Hospital Recommended Level of Care: [place] Prior Approval Number:    Date Approved/Denied:   PASRR Number:  ( [id number] )  Discharge Plan: Domiciliary (Rest home)    Current Diagnoses: Patient Active Problem List   Diagnosis Date Noted  . Syncope 11/30/2015  . Atrial fibrillation ([place]) 11/11/2015  . Degeneration of intervertebral disc of lumbosacral region 11/11/2015  . Atheroma, skin 11/11/2015  . Hip fracture, right ([place]) 11/03/2015  . Hip fracture ([place]) 11/03/2015  . Cerebral ventriculomegaly 07/22/2015  . Abnormal computed tomography of head 05/25/2015  . Fall 10/05/2014  . Appendicular ataxia 10/05/2014  . 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 Pulmonary embolism ([place]) 11/16/2010    Orientation RESPIRATION BLADDER Height & Weight     Self  Normal Continent Weight: 129 lb 15.5 oz (58.954 kg) Height:  5\' 4"  (162.6 cm)  BEHAVIORAL SYMPTOMS/MOOD NEUROLOGICAL BOWEL NUTRITION STATUS   (none )  (none ) Continent Diet (Regular Diet )  AMBULATORY STATUS COMMUNICATION OF NEEDS Skin   Limited Assist Verbally Normal                       Personal Care Assistance Level of Assistance  Bathing, Feeding, Dressing Bathing Assistance: Limited assistance Feeding assistance: Independent Dressing Assistance: Limited assistance     Functional Limitations Info  Sight, Hearing, Speech Sight Info: Adequate Hearing Info: Impaired Speech Info: Adequate     SPECIAL CARE FACTORS FREQUENCY   Will return with [facility] following.                     Contractures      Additional Factors Info  Code Status, Allergies Code Status Info:  (Full Code. ) Allergies Info:  (Penicillins)          Discharge Medications: Please see discharge summary for a list of discharge medications. DISCHARGE MEDICATIONS:   Current Discharge Medication List    CONTINUE these medications which have CHANGED   Details  traMADol (ULTRAM) 50 MG tablet Take 1 tablet (50 mg total) by mouth every 6 (six) hours as needed for moderate pain. Qty: 30 tablet, Refills: 0      CONTINUE these medications which have NOT CHANGED   Details  acetaminophen (TYLENOL) 500 MG tablet Take 500 mg by mouth 3 (three) times daily.    aspirin 81 MG chewable tablet Chew 81 mg by mouth daily. Reported on 11/30/2015    Cholecalciferol (VITAMIN D) 2000 units tablet Take 4,000 Units by mouth daily.    diclofenac sodium (VOLTAREN) 1 % GEL Apply 2 g topically 3 (three) times daily.    donepezil (ARICEPT) 10 MG tablet Take 10 mg by mouth daily.     Melatonin 3 MG TABS Take 1 tablet by mouth at bedtime as needed (sleep).     memantine (NAMENDA) 10 MG tablet Take 10 mg by mouth daily.    Oyster Shell (OYSTER CALCIUM) 500 MG TABS tablet Take 500 mg  of elemental calcium by mouth 2 (two) times daily.    protein supplement (RESOURCE BENEPROTEIN) POWD Take 6 g by mouth 2 (two) times daily.    QUEtiapine (SEROQUEL) 25 MG tablet Take 25 mg by mouth 2 (two) times daily.    senna (SENOKOT) 8.6 MG TABS tablet Take 2 tablets by mouth daily.         Relevant Imaging Results:  Relevant Lab Results:   Additional Information  (SSN: [id number] )  Loralyn Freshwater, LCSW

## 2015-12-01 NOTE — Progress Notes (Signed)
Pt discharged back to the [facility]. Family has planned to supplement care with private sitters and Hospice to follow as well. Reviewed meds and follow-up appts at discharge with daughter.

## 2015-12-01 NOTE — Care Management (Cosign Needed)
I have notified Abby with [facility] that patient will be returning to [facility] followed by [facility]. Daughter Judeth Porch would like EMS. CSW notified.

## 2015-12-01 NOTE — Evaluation (Signed)
Physical Therapy Evaluation Patient Details Name: Jorge Moore MRN: [id number] DOB: 1924/05/14 Today's Date: 12/01/2015   History of Present Illness  Pt admitted for syncopal event with multiple falls history. Pt with history of depression, dementia, and GERD. Pt with recently s/p IM nailing on R femur on 11/04/15 from hip fracture. After previous hospital stay, he completed course of SNF and now is back at the [facility].  Clinical Impression  Pt is a pleasant 82 year old male who was admitted for syncope. Pt performs bed mobility with cga, transfers with cga, and ambulation with min assist using rw. Limited mobility by pain. Pt is poor historian, however pleasant and able to follow commands. Pt demonstrates deficits with strength/endurance/mobility/balance. Would benefit from skilled PT to address above deficits and promote optimal return to PLOF.       Follow Up Recommendations Home health PT;Supervision/Assistance - 24 hour (ALF with hospice)    Equipment Recommendations       Recommendations for Other Services       Precautions / Restrictions Precautions Precautions: Fall Restrictions Weight Bearing Restrictions: No      Mobility  Bed Mobility Overal bed mobility: Needs Assistance Bed Mobility: Supine to Sit     Supine to sit: Min guard     General bed mobility comments: safe technique with bed mobility. Pt able to follow directions and once seated at EOB, no LOB noted.  Transfers Overall transfer level: Needs assistance Equipment used: Rolling walker (2 wheeled) Transfers: Sit to/from Stand Sit to Stand: Min guard         General transfer comment: Pt cued for push from seated surface prior to standing. Safe technique performed. Once standing, pt performs post leaning, corrected with min assist  Ambulation/Gait Ambulation/Gait assistance: Min assist Ambulation Distance (Feet): 20 Feet Assistive device: Rolling walker (2 wheeled) Gait Pattern/deviations:  Step-through pattern     General Gait Details: assist for upright posture and correct sequencing of rw. Pt has difficulty navigating turns and obstacles in room. Pt with slow gait speed with assist for advancing L LE. Cues given for larger step length. Pt fatigues quickly, requesting to sit in recliner.  Stairs            Wheelchair Mobility    Modified Rankin (Stroke Patients Only)       Balance Overall balance assessment: History of Falls;Needs assistance Sitting-balance support: Feet supported Sitting balance-Leahy Scale: Fair     Standing balance support: Bilateral upper extremity supported Standing balance-Leahy Scale: Poor Standing balance comment: post leaning noted                             Pertinent Vitals/Pain Pain Assessment: Faces Faces Pain Scale: Hurts little more Pain Location: B LE with exertion Pain Descriptors / Indicators: Discomfort;Dull Pain Intervention(s): Limited activity within patient's tolerance    Home Living Family/patient expects to be discharged to:: Assisted living               Home Equipment: Walker - 2 wheels      Prior Function Level of Independence: Needs assistance         Comments: Pt unreliable historian, however per chart review was able to walk with RW with assistance     Hand Dominance        Extremity/Trunk Assessment   Upper Extremity Assessment: Generalized weakness (grossly 3+/5)           Lower Extremity Assessment:  Generalized weakness (grossly 3+/5)         Communication   Communication: No difficulties  Cognition Arousal/Alertness: Awake/alert Behavior During Therapy: WFL for tasks assessed/performed Overall Cognitive Status: History of cognitive impairments - at baseline                      General Comments      Exercises Other Exercises Other Exercises: Seated ther-ex performed including B LE hip add squeezes, quad sets, hip abd/add, ankle pumps, and B UE  shoulder flexion. All ther-ex performed x 10 reps with cga for assistance.      Assessment/Plan    PT Assessment Patient needs continued PT services  PT Diagnosis Difficulty walking;Generalized weakness   PT Problem List Decreased strength;Decreased activity tolerance;Decreased balance;Decreased mobility;Decreased knowledge of use of DME;Decreased safety awareness;Pain  PT Treatment Interventions DME instruction;Gait training;Therapeutic exercise   PT Goals (Current goals can be found in the Care Plan section) Acute Rehab PT Goals Patient Stated Goal: to get stronger PT Goal Formulation: With patient Time For Goal Achievement: 12/15/15 Potential to Achieve Goals: Good    Frequency Min 2X/week   Barriers to discharge        Co-evaluation               End of Session Equipment Utilized During Treatment: Gait belt Activity Tolerance: Patient tolerated treatment well Patient left: in chair;with chair alarm set Nurse Communication: Mobility status    Functional Assessment Tool Used: clinical judgement Functional Limitation: Mobility: Walking and moving around Mobility: Walking and Moving Around Current Status [phone]): At least 20 percent but less than 40 percent impaired, limited or restricted Mobility: Walking and Moving Around Goal Status [phone]): At least 1 percent but less than 20 percent impaired, limited or restricted    Time: 0856-0914 PT Time Calculation (min) (ACUTE ONLY): 18 min   Charges:   PT Evaluation $PT Eval Moderate Complexity: 1 Procedure PT Treatments $Therapeutic Exercise: 8-22 mins   PT G Codes:   PT G-Codes **NOT FOR INPATIENT CLASS** Functional Assessment Tool Used: clinical judgement Functional Limitation: Mobility: Walking and moving around Mobility: Walking and Moving Around Current Status [id number]): At least 20 percent but less than 40 percent impaired, limited or restricted Mobility: Walking and Moving Around Goal Status [phone]): At least  1 percent but less than 20 percent impaired, limited or restricted    Tania Steinhauser 12/01/2015, 11:54 AM  Greggory Stallion, PT, DPT [phone]

## 2015-12-11 ENCOUNTER — Encounter: Payer: Self-pay | Admitting: Emergency Medicine

## 2015-12-11 ENCOUNTER — Emergency Department

## 2015-12-11 ENCOUNTER — Emergency Department
Admission: EM | Admit: 2015-12-11 | Discharge: 2015-12-12 | Disposition: A | Discharge status: Skilled Nursing Facility | Attending: Emergency Medicine | Admitting: Emergency Medicine

## 2015-12-11 DIAGNOSIS — F329 Major depressive disorder, single episode, unspecified: Secondary | ICD-10-CM | POA: Insufficient documentation

## 2015-12-11 DIAGNOSIS — Y999 Unspecified external cause status: Secondary | ICD-10-CM | POA: Diagnosis not present

## 2015-12-11 DIAGNOSIS — I4891 Unspecified atrial fibrillation: Secondary | ICD-10-CM | POA: Insufficient documentation

## 2015-12-11 DIAGNOSIS — G309 Alzheimer's disease, unspecified: Secondary | ICD-10-CM | POA: Diagnosis not present

## 2015-12-11 DIAGNOSIS — Y939 Activity, unspecified: Secondary | ICD-10-CM | POA: Insufficient documentation

## 2015-12-11 DIAGNOSIS — W06XXXA Fall from bed, initial encounter: Secondary | ICD-10-CM | POA: Diagnosis not present

## 2015-12-11 DIAGNOSIS — S72114A Nondisplaced fracture of greater trochanter of right femur, initial encounter for closed fracture: Secondary | ICD-10-CM | POA: Diagnosis not present

## 2015-12-11 DIAGNOSIS — Z87891 Personal history of nicotine dependence: Secondary | ICD-10-CM | POA: Insufficient documentation

## 2015-12-11 DIAGNOSIS — M25551 Pain in right hip: Secondary | ICD-10-CM | POA: Diagnosis present

## 2015-12-11 DIAGNOSIS — I2699 Other pulmonary embolism without acute cor pulmonale: Secondary | ICD-10-CM | POA: Diagnosis not present

## 2015-12-11 DIAGNOSIS — Z79899 Other long term (current) drug therapy: Secondary | ICD-10-CM | POA: Diagnosis not present

## 2015-12-11 DIAGNOSIS — Z880 Allergy status to penicillin: Secondary | ICD-10-CM | POA: Insufficient documentation

## 2015-12-11 DIAGNOSIS — F0280 Dementia in other diseases classified elsewhere without behavioral disturbance: Secondary | ICD-10-CM | POA: Insufficient documentation

## 2015-12-11 DIAGNOSIS — Z7982 Long term (current) use of aspirin: Secondary | ICD-10-CM | POA: Insufficient documentation

## 2015-12-11 DIAGNOSIS — C61 Malignant neoplasm of prostate: Secondary | ICD-10-CM | POA: Insufficient documentation

## 2015-12-11 DIAGNOSIS — Z791 Long term (current) use of non-steroidal anti-inflammatories (NSAID): Secondary | ICD-10-CM | POA: Insufficient documentation

## 2015-12-11 DIAGNOSIS — W19XXXA Unspecified fall, initial encounter: Secondary | ICD-10-CM

## 2015-12-11 DIAGNOSIS — Y92129 Unspecified place in nursing home as the place of occurrence of the external cause: Secondary | ICD-10-CM | POA: Insufficient documentation

## 2015-12-11 HISTORY — DX: Unspecified fracture of unspecified femur, initial encounter for closed fracture: S72.90XA

## 2015-12-11 NOTE — ED Notes (Signed)
Pt resident of the [facility]. Per ems staff states "they walked by and he was in bed, they walked back by and he was on the floor." pt complains of right hip , pt had right hip replaced last week per ems. Unwitnessed fall, no obvious head trauma present, however ems states "the staff thinks he may have hit his head." pt with history of dementia, alert to self only.

## 2015-12-12 ENCOUNTER — Emergency Department

## 2015-12-12 NOTE — ED Notes (Signed)
Report to mallory simmons, pct at the [facility].

## 2015-12-12 NOTE — Discharge Instructions (Signed)
Continue medicines and physical therapy as usual. Return to the ER for worsening symptoms, persistent vomiting, difficulty breathing or other concerns.  Fall Prevention in Hospitals, Adult As a hospital patient, your condition and the treatments you receive can increase your risk for falls. Some additional risk factors for falls in a hospital include:  Being in an unfamiliar environment.  Being on bed rest.  Your surgery.  Taking certain medicines.  Your tubing requirements, such as intravenous (IV) therapy or catheters. It is important that you learn how to decrease fall risks while at the hospital. Below are important tips that can help prevent falls. SAFETY TIPS FOR PREVENTING FALLS Talk about your risk of falling.  Ask your health care provider why you are at risk for falling. Is it your medicine, illness, tubing placement, or something else?  Make a plan with your health care provider to keep you safe from falls.  Ask your health care provider or pharmacist about side effects of your medicines. Some medicines can make you dizzy or affect your coordination. Ask for help.  Ask for help before getting out of bed. You may need to press your call button.  Ask for assistance in getting safely to the toilet.  Ask for a walker or cane to be put at your bedside. Ask that most of the side rails on your bed be placed up before your health care provider leaves the room.  Ask family or friends to sit with you.  Ask for things that are out of your reach, such as your glasses, hearing aids, telephone, bedside table, or call button. Follow these tips to avoid falling:  Stay lying or seated, rather than standing, while waiting for help.  Wear rubber-soled slippers or shoes whenever you walk in the hospital.  Avoid quick, sudden movements.  Change positions slowly.  Sit on the side of your bed before standing.  Stand up slowly and wait before you start to walk.  Let your health  care provider know if there is a spill on the floor.  Pay careful attention to the medical equipment, electrical cords, and tubes around you.  When you need help, use your call button by your bed or in the bathroom. Wait for one of your health care providers to help you.  If you feel dizzy or unsure of your footing, return to bed and wait for assistance.  Avoid being distracted by the TV, telephone, or another person in your room.  Do not lean or support yourself on rolling objects, such as IV poles or bedside tables.   This information is not intended to replace advice given to you by your health care provider. Make sure you discuss any questions you have with your health care provider.   Document Released: 08/31/2000 Document Revised: 09/24/2014 Document Reviewed: 05/11/2012 Elsevier Interactive Patient Education 2016 Elsevier Inc[employer]  Hip Fracture A hip fracture is a fracture of the upper part of your thigh bone (femur).  CAUSES A hip fracture is caused by a direct blow to the side of your hip. This is usually the result of a fall but can occur in other circumstances, such as an automobile accident. RISK FACTORS There is an increased risk of hip fractures in people with:  An unsteady walking pattern (gait) and those with conditions that contribute to poor balance, such as Parkinson's disease or dementia.  Osteopenia and osteoporosis.  Cancer that spreads to the leg bones.  Certain metabolic diseases. SYMPTOMS  Symptoms of hip fracture include:  Pain over the injured hip.  Inability to put weight on the leg in which the fracture occurred (although, some patients are able to walk after a hip fracture).  Toes and foot of the affected leg point outward when you lie down. DIAGNOSIS A physical exam can determine if a hip fracture is likely to have occurred. X-ray exams are needed to confirm the fracture and to look for other injuries. The X-ray exam can help to determine the type  of hip fracture. Rarely, the fracture is not visible on an X-ray image and a CT scan or MRI will have to be done. TREATMENT  The treatment for a fracture is usually surgery. This means using a screw, nail, or rod to hold the bones in place.  HOME CARE INSTRUCTIONS Take all medicines as directed by your health care provider. SEEK MEDICAL CARE IF: Pain continues, even after taking pain medicine. MAKE SURE YOU:  Understand these instructions.   Will watch your condition.  Will get help right away if you are not doing well or get worse.   This information is not intended to replace advice given to you by your health care provider. Make sure you discuss any questions you have with your health care provider.   Document Released: 09/03/2005 Document Revised: 09/08/2013 Document Reviewed: 04/15/2013 Elsevier Interactive Patient Education [employer].

## 2015-12-12 NOTE — ED Provider Notes (Signed)
[facility] Emergency Department Provider Note ____________________________________________  Time seen: Approximately 1:04 AM  I have reviewed the triage vital signs and the nursing notes.   HISTORY  Chief Complaint Fall and Hip Pain  History limited by dementia  HPI Jorge Moore is a 82 y.o. male who presents to the ED from skilled nursing facility via EMS for unwitnessed fall. Reportedly staff found patient on the floor. Patient initially complained of right hip pain but denies pain currently. History of dementia, alert to self only so history is limited. Review of records reveal patient had ORIF of right intertrochanteric hip fracture on 11/04/2015 by Dr. Marry Guan. Notes reveal his physical therapy involvement is limited due to his severe dementia and inability to follow commands and directions for physical therapy. Currently denies chest pain, shortness of breath, abdominal pain, nausea, vomiting, diarrhea.   Past Medical History  Diagnosis Date  . Depressed   . Dementia   . GERD (gastroesophageal reflux disease)   . Prostate cancer ([place])   . Atrial fibrillation ([place])   . Ataxia   . PE (pulmonary embolism)   . Femur fracture [facility])     Patient Active Problem List   Diagnosis Date Noted  . Syncope 11/30/2015  . Atrial fibrillation ([place]) 11/11/2015  . Degeneration of intervertebral disc of lumbosacral region 11/11/2015  . Atheroma, skin 11/11/2015  . Hip fracture, right ([place]) 11/03/2015  . Hip fracture ([place]) 11/03/2015  . Cerebral ventriculomegaly 07/22/2015  . Abnormal computed tomography of head 05/25/2015  . Fall 10/05/2014  . Appendicular ataxia 10/05/2014  . 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 Pulmonary embolism ([place]) 11/16/2010    Past Surgical History  Procedure Laterality Date  . Intramedullary (im) nail intertrochanteric Right 11/04/2015    Procedure: INTRAMEDULLARY (IM) NAIL INTERTROCHANTRIC;  Surgeon: Dereck Leep, MD;  Location: [facility] ORS;  Service: Orthopedics;  Laterality: Right;    Current Outpatient Rx  Name  Route  Sig  Dispense  Refill  . acetaminophen (TYLENOL) 500 MG tablet   Oral   Take 500 mg by mouth 3 (three) times daily.         Marland Kitchen aspirin 81 MG chewable tablet   Oral   Chew 81 mg by mouth daily. Reported on 11/30/2015         . Cholecalciferol (VITAMIN D) 2000 units tablet   Oral   Take 4,000 Units by mouth daily.         . diclofenac sodium (VOLTAREN) 1 % GEL   Topical   Apply 2 g topically 3 (three) times daily.         Marland Kitchen donepezil (ARICEPT) 10 MG tablet   Oral   Take 10 mg by mouth daily.          . Melatonin 3 MG TABS   Oral   Take 1 tablet by mouth at bedtime as needed (sleep).          . memantine (NAMENDA) 10 MG tablet   Oral   Take 10 mg by mouth daily.         [place] (OYSTER CALCIUM) 500 MG TABS tablet   Oral   Take 500 mg of elemental calcium by mouth 2 (two) times daily.         . protein supplement (RESOURCE BENEPROTEIN) POWD   Oral   Take 6 g by mouth 2 (two) times daily.         . QUEtiapine (SEROQUEL) 25  MG tablet   Oral   Take 25 mg by mouth 2 (two) times daily.         Marland Kitchen senna (SENOKOT) 8.6 MG TABS tablet   Oral   Take 2 tablets by mouth daily.         . traMADol (ULTRAM) 50 MG tablet   Oral   Take 1 tablet (50 mg total) by mouth every 6 (six) hours as needed for moderate pain.   30 tablet   0     Allergies Penicillins  Family History  Problem Relation Age of Onset  . Colon cancer Mother   . Diabetes Father   . Diabetes Brother     Social History Social History  Substance Use Topics  . Smoking status: Former Research scientist (life sciences)  . Smokeless tobacco: Never Used  . Alcohol Use: No    Review of Systems  Constitutional: No fever/chills. Eyes: No visual changes. ENT: No sore throat. Cardiovascular: Denies chest pain. Respiratory: Denies shortness of breath. Gastrointestinal: No abdominal pain.  No  nausea, no vomiting.  No diarrhea.  No constipation. Genitourinary: Negative for dysuria. Musculoskeletal: Positive for right hip pain. Negative for back pain. Skin: Negative for rash. Neurological: Negative for headaches, focal weakness or numbness.  10-point ROS otherwise negative.  ____________________________________________   PHYSICAL EXAM:  VITAL SIGNS: ED Triage Vitals  Enc Vitals Group     BP 12/11/15 2339 129/72 mmHg     Pulse Rate 12/11/15 2339 76     Resp 12/11/15 2339 14     Temp 12/11/15 2339 98.6 F (37 C)     Temp Source 12/11/15 2339 Oral     SpO2 12/11/15 2339 100 %     Weight 12/11/15 2339 125 lb (56.7 kg)     Height --      Head Cir --      Peak Flow --      Pain Score 12/11/15 2343 4     Pain Loc --      Pain Edu? --      Excl. in [place]? --     Constitutional: Alert and oriented. Well appearing and in no acute distress. Eyes: Conjunctivae are normal. PERRL. EOMI. Head: Atraumatic. Nose: No congestion/rhinnorhea. Mouth/Throat: Mucous membranes are moist.  Oropharynx non-erythematous. Neck: No stridor.  No cervical spine tenderness to palpation.  No carotid bruits. Cardiovascular: Normal rate, regular rhythm. Grossly normal heart sounds.  Good peripheral circulation. Respiratory: Normal respiratory effort.  No retractions. Lungs CTAB. Gastrointestinal: Soft and nontender. No distention. No abdominal bruits. No CVA tenderness. Musculoskeletal: No lower extremity tenderness nor edema.  Limited range of motion secondary to dementia and unable to follow commands. No joint effusions. Neurologic:  Normal speech and language. No gross focal neurologic deficits are appreciated. MAE 4. Alert and oriented to self only. Skin:  Skin is warm, dry and intact. No rash noted. Psychiatric: Mood and affect are normal. Speech and behavior are normal.  ____________________________________________   LABS (all labs ordered are listed, but only abnormal results are  displayed)  Labs Reviewed - No data to display ____________________________________________  EKG  ED ECG REPORT I, Druanne Bosques J, the attending physician, personally viewed and interpreted this ECG.   Date: 12/12/2015  EKG Time: 2353  Rate: 76  Rhythm: normal EKG, normal sinus rhythm  Axis: Normal  Intervals:none  ST&T Change: Nonspecific  ____________________________________________  RADIOLOGY  CT head interpreted per Dr. Radene Knee: 1. No acute intracranial pathology seen on CT. 2. Moderate cortical volume loss and  diffuse small vessel ischemic microangiopathy. 3. 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 Chronic infarct at the right basal Ganglia.  Right hip x-rays (viewed by me, interpreted per Dr. Quintella Reichert): Right femoral intra medullary hardware and transcervical fixation screw. There is nondisplaced fracture of the greater trochanter which appears new from prior study. ____________________________________________   PROCEDURES  Procedure(s) performed: None  Critical Care performed: No  ____________________________________________   INITIAL IMPRESSION / ASSESSMENT AND PLAN / ED COURSE  Pertinent labs & imaging results that were available during my care of the patient were reviewed by me and considered in my medical decision making (see chart for details).  82 year old male who presents s/p unwitnessed fall from skilled nursing facility. Initially complained of right hip pain; patient had right ORIF in February. Will obtain EKG, ET head, right hip x-rays.  ----------------------------------------- 1:17 AM on 12/12/2015 -----------------------------------------  Updated patient of CT and x-ray imaging studies. Will discuss new nondisplaced greater trochanter fracture with on-call orthopedist Dr. Rudene Christians.  ----------------------------------------- 1:40 AM on  12/12/2015 -----------------------------------------  Reviewed x-ray findings with Dr. Rudene Christians who agrees with discharge back to facility. Strict return precautions given. Patient verbalizes understanding and agrees with plan of care. ____________________________________________   FINAL CLINICAL IMPRESSION(S) / ED DIAGNOSES  Final diagnoses:  Fall, initial encounter  Closed nondisplaced fracture of greater trochanter of right femur, initial encounter (HCC)      Paulette Blanch, MD 12/12/15 [phone]

## 2015-12-12 NOTE — ED Notes (Signed)
Pt resting with eyes closed, resps unlabored. Additional warm blankets provided for comfort.

## 2016-04-14 ENCOUNTER — Emergency Department
Admission: EM | Admit: 2016-04-14 | Discharge: 2016-04-15 | Disposition: A | Discharge status: Home / Self Care | Attending: Student | Admitting: Student

## 2016-04-14 ENCOUNTER — Emergency Department

## 2016-04-14 ENCOUNTER — Encounter: Payer: Self-pay | Admitting: Emergency Medicine

## 2016-04-14 DIAGNOSIS — Y92239 Unspecified place in hospital as the place of occurrence of the external cause: Secondary | ICD-10-CM | POA: Insufficient documentation

## 2016-04-14 DIAGNOSIS — Z8679 Personal history of other diseases of the circulatory system: Secondary | ICD-10-CM | POA: Insufficient documentation

## 2016-04-14 DIAGNOSIS — Z7982 Long term (current) use of aspirin: Secondary | ICD-10-CM | POA: Insufficient documentation

## 2016-04-14 DIAGNOSIS — W08XXXA Fall from other furniture, initial encounter: Secondary | ICD-10-CM | POA: Insufficient documentation

## 2016-04-14 DIAGNOSIS — I4891 Unspecified atrial fibrillation: Secondary | ICD-10-CM | POA: Diagnosis not present

## 2016-04-14 DIAGNOSIS — W19XXXA Unspecified fall, initial encounter: Secondary | ICD-10-CM

## 2016-04-14 DIAGNOSIS — Y9389 Activity, other specified: Secondary | ICD-10-CM | POA: Insufficient documentation

## 2016-04-14 DIAGNOSIS — Y999 Unspecified external cause status: Secondary | ICD-10-CM | POA: Diagnosis not present

## 2016-04-14 DIAGNOSIS — F329 Major depressive disorder, single episode, unspecified: Secondary | ICD-10-CM | POA: Insufficient documentation

## 2016-04-14 DIAGNOSIS — Z87891 Personal history of nicotine dependence: Secondary | ICD-10-CM | POA: Diagnosis not present

## 2016-04-14 DIAGNOSIS — Z8546 Personal history of malignant neoplasm of prostate: Secondary | ICD-10-CM | POA: Diagnosis not present

## 2016-04-14 DIAGNOSIS — M25559 Pain in unspecified hip: Secondary | ICD-10-CM | POA: Insufficient documentation

## 2016-04-14 NOTE — ED Provider Notes (Signed)
[facility] Emergency Department Provider Note   ____________________________________________   First Provider Time approximately 12 AM.  I have reviewed the triage vital signs and the nursing notes.   HISTORY  Chief Complaint Fall  Caveat-history of present illness and review of systems Limited due to the patient's dementia. All information is obtained from his daughter at bedside.  HPI Jorge Moore is a 82 y.o. male with history of dementia, frequent falls, wheelchair-bound, history of atrial fibrillation on 81 mg of aspirin but no other anticoagulants who presents for evaluation of an unwitnessed fall that occurred today at the [facility]. According to his daughter, staff told her that the patient wanted to move from the wheelchair to the sofa. They allowed him to do this with their assistance. Sometime later when they checked on him, he had fallen off of the sofa and was seated on the floor near his wheelchair. He was awake and alert, there was no concern for head injury or loss of consciousness. He was complaining of hip pain. He has otherwise been in his usual state of health without illness. No vomiting, diarrhea, fevers, chills, no cough.   Past Medical History:  Diagnosis Date  . Ataxia   . Atrial fibrillation ([place])   . Dementia   . Depressed   . Femur fracture ([place])   . GERD (gastroesophageal reflux disease)   . PE (pulmonary embolism)   . Prostate cancer [facility])     Patient Active Problem List   Diagnosis Date Noted  . Syncope 11/30/2015  . Atrial fibrillation ([place]) 11/11/2015  . Degeneration of intervertebral disc of lumbosacral region 11/11/2015  . Atheroma, skin 11/11/2015  . Hip fracture, right ([place]) 11/03/2015  . Hip fracture ([place]) 11/03/2015  . Cerebral ventriculomegaly 07/22/2015  . Abnormal computed tomography of head 05/25/2015  . Fall 10/05/2014  . Appendicular ataxia 10/05/2014  . Mixed Alzheimer's and vascular  dementia 06/24/2014  . Pulmonary embolism ([place]) 11/16/2010    Past Surgical History:  Procedure Laterality Date  . INTRAMEDULLARY (IM) NAIL INTERTROCHANTERIC Right 11/04/2015   Procedure: INTRAMEDULLARY (IM) NAIL INTERTROCHANTRIC;  Surgeon: Dereck Leep, MD;  Location: [facility] ORS;  Service: Orthopedics;  Laterality: Right;    Prior to Admission medications   Medication Sig Start Date End Date Taking? Authorizing Provider  acetaminophen (TYLENOL) 500 MG tablet Take 500 mg by mouth 3 (three) times daily.    Historical Provider, MD  aspirin 81 MG chewable tablet Chew 81 mg by mouth daily. Reported on 11/30/2015    Historical Provider, MD  Cholecalciferol (VITAMIN D) 2000 units tablet Take 4,000 Units by mouth daily.    Historical Provider, MD  diclofenac sodium (VOLTAREN) 1 % GEL Apply 2 g topically 3 (three) times daily.    Historical Provider, MD  donepezil (ARICEPT) 10 MG tablet Take 10 mg by mouth daily.     Historical Provider, MD  Melatonin 3 MG TABS Take 1 tablet by mouth at bedtime as needed (sleep).     Historical Provider, MD  memantine (NAMENDA) 10 MG tablet Take 10 mg by mouth daily.    Historical Provider, MD  Loma [place] (OYSTER CALCIUM) 500 MG TABS tablet Take 500 mg of elemental calcium by mouth 2 (two) times daily.    Historical Provider, MD  protein supplement (RESOURCE BENEPROTEIN) POWD Take 6 g by mouth 2 (two) times daily.    Historical Provider, MD  QUEtiapine (SEROQUEL) 25 MG tablet Take 25 mg by mouth 2 (two)  times daily.    Historical Provider, MD  senna (SENOKOT) 8.6 MG TABS tablet Take 2 tablets by mouth daily.    Historical Provider, MD  traMADol (ULTRAM) 50 MG tablet Take 1 tablet (50 mg total) by mouth every 6 (six) hours as needed for moderate pain. 12/01/15   Gladstone Lighter, MD    Allergies Penicillins  Family History  Problem Relation Age of Onset  . Colon cancer Mother   . Diabetes Father   . Diabetes Brother     Social History Social History    Substance Use Topics  . Smoking status: Former Research scientist (life sciences)  . Smokeless tobacco: Never Used  . Alcohol use No    Review of Systems   Caveat-history of present illness and review of systems Limited due to the patient's dementia. All information is obtained from his daughter at bedside. ____________________________________________   PHYSICAL EXAM:  VITAL SIGNS: ED Triage Vitals  Enc Vitals Group     BP 04/14/16 2125 132/65     Pulse Rate 04/14/16 2134 65     Resp 04/14/16 2125 13     Temp 04/14/16 2148 98.4 F (36.9 C)     Temp Source 04/14/16 2148 Oral     SpO2 04/14/16 2134 99 %     Weight 04/14/16 2148 130 lb (59 kg)     Height 04/14/16 2148 5\' 8"  (1.727 m)     Head Circumference --      Peak Flow --      Pain Score --      Pain Loc --      Pain Edu? --      Excl. in [place]? --     Constitutional: Awake and alert, pleasantly demented, babbling mostly incoherently which daughter says is his baseline. Well appearing and in no acute distress. Eyes: Conjunctivae are normal. PERRL. EOMI. Head: Atraumatic. No bony step-off, no deformity. Nose: No congestion/rhinnorhea. Mouth/Throat: Mucous membranes are moist.  Oropharynx non-erythematous. Neck: No stridor.  No cervical spine tenderness to palpation. Cardiovascular: Normal rate, regular rhythm. Grossly normal heart sounds.  Good peripheral circulation. Respiratory: Normal respiratory effort.  No retractions. Lungs CTAB. Gastrointestinal: Soft and nontender. No distention. No CVA tenderness. Genitourinary: deferred Musculoskeletal: No lower extremity tenderness nor edema. Pelvis stable to rock and compression. No midline T or L-spine tenderness to palpation. Full active painless range of motion of bilateral hip joints. Neurologic:  No dysarthria, babbling speech is his baseline and secondary to dementia. 5 out of 5 strength in bilateral upper and lower extremities, sensation appears intact to light touch throughout. Skin:  Skin is  warm, dry and intact. No rash noted. Psychiatric: Mood and affect are normal. Speech and behavior are normal given medical history  ____________________________________________   LABS (all labs ordered are listed, but only abnormal results are displayed)  Labs Reviewed - No data to display ____________________________________________  EKG  ED ECG REPORT I, Joanne Gavel, the attending physician, personally viewed and interpreted this ECG.   Date: 04/15/2016  EKG Time: 21:42  Rate: 69  Rhythm: normal sinus rhythm with premature atrial contractions.  Axis: nromal  Intervals:none  ST&T Change: No acute ST elevation MI, no acute ST depression.  ____________________________________________  RADIOLOGY  Xray right hip IMPRESSION: No acute fracture or dislocation identified about the bilateral hips. ____________________________________________   PROCEDURES  Procedure(s) performed: None  Procedures  Critical Care performed: No  ____________________________________________   INITIAL IMPRESSION / ASSESSMENT AND PLAN / ED COURSE  Pertinent labs & imaging  results that were available during my care of the patient were reviewed by me and considered in my medical decision making (see chart for details).  Jorge Moore is a 82 y.o. male with history of dementia, frequent falls, wheelchair-bound, history of atrial fibrillation on 81 mg of aspirin but no other anticoagulants who presents for evaluation of an unwitnessed fall that occurred today at the [facility]. On exam, he is very well-appearing and in no acute distress. Vital signs stable, he is afebrile. He is pleasantly demented and at his baseline in terms of mental status. There is no evidence of head trauma and his exam is altogether atraumatic. Currently he is not complaining of any hip pain. Plain films negative for fracture or dislocation. Per my discussion with his daughter at bedside, this sounds like a  mechanical fall however we discussed that it cannot be confirmed completely because of the patient's severe dementia. I have offered screening labs and urinalysis however the patient's daughter has declined stating that this is his baseline, these falls happens to him frequently, and he has not been ill. I feel this is reasonable. We discussed return precautions, need for close PCP follow-up and she is comfortable with the discharge plan. DC home.  Clinical Course     ____________________________________________   FINAL CLINICAL IMPRESSION(S) / ED DIAGNOSES  Final diagnoses:  Fall, initial encounter  Hip pain, unspecified laterality      NEW MEDICATIONS STARTED DURING THIS VISIT:  New Prescriptions   No medications on file     Note:  This document was prepared using Dragon voice recognition software and may include unintentional dictation errors.    Joanne Gavel, MD 04/15/16 [phone]

## 2016-04-14 NOTE — ED Notes (Signed)
Patient transported to X-ray 

## 2016-04-14 NOTE — ED Triage Notes (Signed)
Pt in via EMS from the [facility].  Pt with unwitnessed fall tonight, complaining of right and left hip pain.  Pt reports back pain and right leg pain.  Pt daughter and POA at bedside, she reports previous hairline fx to a hip, expressed concern that he may have re-injured that site.  Daughter unable to recall which hip. Pt alert to self only, vitals WDL, no immediate distress at this time.

## 2016-04-16 ENCOUNTER — Emergency Department

## 2016-04-16 ENCOUNTER — Emergency Department
Admission: EM | Admit: 2016-04-16 | Discharge: 2016-04-16 | Disposition: A | Discharge status: Home / Self Care | Attending: Emergency Medicine | Admitting: Emergency Medicine

## 2016-04-16 ENCOUNTER — Encounter: Payer: Self-pay | Admitting: Emergency Medicine

## 2016-04-16 DIAGNOSIS — Y939 Activity, unspecified: Secondary | ICD-10-CM | POA: Insufficient documentation

## 2016-04-16 DIAGNOSIS — Z87891 Personal history of nicotine dependence: Secondary | ICD-10-CM | POA: Diagnosis not present

## 2016-04-16 DIAGNOSIS — W19XXXA Unspecified fall, initial encounter: Secondary | ICD-10-CM | POA: Insufficient documentation

## 2016-04-16 DIAGNOSIS — N390 Urinary tract infection, site not specified: Secondary | ICD-10-CM | POA: Insufficient documentation

## 2016-04-16 DIAGNOSIS — Y999 Unspecified external cause status: Secondary | ICD-10-CM | POA: Diagnosis not present

## 2016-04-16 DIAGNOSIS — R4182 Altered mental status, unspecified: Secondary | ICD-10-CM | POA: Diagnosis present

## 2016-04-16 DIAGNOSIS — Z7982 Long term (current) use of aspirin: Secondary | ICD-10-CM | POA: Insufficient documentation

## 2016-04-16 DIAGNOSIS — Y929 Unspecified place or not applicable: Secondary | ICD-10-CM | POA: Insufficient documentation

## 2016-04-16 LAB — CBC
HCT: 35.3 % — ABNORMAL LOW (ref 40.0–52.0)
Hemoglobin: 12.6 g/dL — ABNORMAL LOW (ref 13.0–18.0)
MCH: 34.2 pg — AB (ref 26.0–34.0)
MCHC: 35.7 g/dL (ref 32.0–36.0)
MCV: 95.9 fL (ref 80.0–100.0)
PLATELETS: 194 10*3/uL (ref 150–440)
RBC: 3.69 MIL/uL — AB (ref 4.40–5.90)
RDW: 13.2 % (ref 11.5–14.5)
WBC: 9.5 10*3/uL (ref 3.8–10.6)

## 2016-04-16 LAB — URINALYSIS COMPLETE WITH MICROSCOPIC (ARMC ONLY)
BILIRUBIN URINE: NEGATIVE
Bacteria, UA: NONE SEEN
GLUCOSE, UA: NEGATIVE mg/dL
HGB URINE DIPSTICK: NEGATIVE
Leukocytes, UA: NEGATIVE
Nitrite: NEGATIVE
Protein, ur: 30 mg/dL — AB
Specific Gravity, Urine: 1.029 (ref 1.005–1.030)
pH: 5 (ref 5.0–8.0)

## 2016-04-16 LAB — BASIC METABOLIC PANEL
Anion gap: 9 (ref 5–15)
BUN: 22 mg/dL — ABNORMAL HIGH (ref 6–20)
CALCIUM: 9.3 mg/dL (ref 8.9–10.3)
CO2: 23 mmol/L (ref 22–32)
CREATININE: 1.09 mg/dL (ref 0.61–1.24)
Chloride: 106 mmol/L (ref 101–111)
GFR, EST NON AFRICAN AMERICAN: 58 mL/min — AB (ref >60)
Glucose, Bld: 98 mg/dL (ref 65–99)
Potassium: 4 mmol/L (ref 3.5–5.1)
SODIUM: 138 mmol/L (ref 135–145)

## 2016-04-16 LAB — TROPONIN I: Troponin I: <0.03 ng/mL (ref <0.03)

## 2016-04-16 MED ORDER — CEPHALEXIN 500 MG PO CAPS: 500.0000 mg | ORAL_CAPSULE | Freq: Three times a day (TID) | ORAL | 0 refills | Status: DC

## 2016-04-16 MED ORDER — DEXTROSE 5 % IV SOLN
1.0000 g | Freq: Once | INTRAVENOUS | Status: AC
  Administered 2016-04-16: 1 g via INTRAVENOUS
  Filled 2016-04-16: qty 10

## 2016-04-16 NOTE — ED Notes (Signed)
Incontinent of medium pasty stool. Pericare and complete bed change provided.

## 2016-04-16 NOTE — ED Notes (Signed)
Attempted to call report to nursing home. Line busy.

## 2016-04-16 NOTE — Discharge Instructions (Signed)
Please seek medical attention for any high fevers, chest pain, shortness of breath, change in behavior, persistent vomiting, bloody stool or any other new or concerning symptoms.  

## 2016-04-16 NOTE — ED Notes (Signed)
Report called to receiving facility [facility] [place].

## 2016-04-16 NOTE — ED Notes (Signed)
Daughter Pamala Hurry called and informed of transfer back to nursing home.

## 2016-04-16 NOTE — ED Notes (Signed)
Pt agitated and trying to pull clothes off

## 2016-04-16 NOTE — ED Notes (Signed)
Dr Archie Balboa at bedside. Daughter at bedside.

## 2016-04-16 NOTE — ED Triage Notes (Signed)
Pt had unwitnessed fall. Leg trapped in wheelchair when staff entered room. From [facility].

## 2016-04-16 NOTE — ED Provider Notes (Signed)
[facility] Emergency Department Provider Note    ____________________________________________   I have reviewed the triage vital signs and the nursing notes.   HISTORY  Chief Complaint Fall   History limited by: Dementia, some history obtained from daughter   HPI Jorge Moore is a 82 y.o. male with history of dementia presents from living facility after an unwitnessed fall. Per report he was found by his wheelchair. Discussed with his daughter she did state that there was some concern that he might of hit his head. Patient himself is unable to give any significant history. He is oriented to himself.   Past Medical History:  Diagnosis Date  . Ataxia   . Atrial fibrillation ([place])   . Dementia   . Depressed   . Femur fracture ([place])   . GERD (gastroesophageal reflux disease)   . PE (pulmonary embolism)   . Prostate cancer [facility])     Patient Active Problem List   Diagnosis Date Noted  . Syncope 11/30/2015  . Atrial fibrillation ([place]) 11/11/2015  . Degeneration of intervertebral disc of lumbosacral region 11/11/2015  . Atheroma, skin 11/11/2015  . Hip fracture, right ([place]) 11/03/2015  . Hip fracture ([place]) 11/03/2015  . Cerebral ventriculomegaly 07/22/2015  . Abnormal computed tomography of head 05/25/2015  . Fall 10/05/2014  . Appendicular ataxia 10/05/2014  . 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 Pulmonary embolism ([place]) 11/16/2010    Past Surgical History:  Procedure Laterality Date  . INTRAMEDULLARY (IM) NAIL INTERTROCHANTERIC Right 11/04/2015   Procedure: INTRAMEDULLARY (IM) NAIL INTERTROCHANTRIC;  Surgeon: Dereck Leep, MD;  Location: [facility] ORS;  Service: Orthopedics;  Laterality: Right;    Prior to Admission medications   Medication Sig Start Date End Date Taking? Authorizing Provider  acetaminophen (TYLENOL) 500 MG tablet Take 500 mg by mouth 3 (three) times daily.   Yes Historical Provider, MD  aspirin 81 MG  chewable tablet Chew 81 mg by mouth daily. Reported on 11/30/2015   Yes Historical Provider, MD  Cholecalciferol (VITAMIN D) 2000 units tablet Take 4,000 Units by mouth daily.   Yes Historical Provider, MD  donepezil (ARICEPT) 10 MG tablet Take 10 mg by mouth daily.    Yes Historical Provider, MD  feeding supplement, ENSURE ENLIVE, (ENSURE ENLIVE) LIQD Take 237 mLs by mouth 2 (two) times daily as needed.   Yes Historical Provider, MD  Melatonin 3 MG TABS Take 1 tablet by mouth at bedtime as needed (sleep).    Yes Historical Provider, MD  memantine (NAMENDA) 10 MG tablet Take 10 mg by mouth daily.   Yes Historical Provider, MD  Loma [place] (OYSTER CALCIUM) 500 MG TABS tablet Take 500 mg of elemental calcium by mouth 2 (two) times daily.   Yes Historical Provider, MD  protein supplement (RESOURCE BENEPROTEIN) POWD Take 6 g by mouth 2 (two) times daily as needed.    Yes Historical Provider, MD  senna (SENOKOT) 8.6 MG TABS tablet Take 1 tablet by mouth every other day.    Yes Historical Provider, MD  traMADol (ULTRAM) 50 MG tablet Take 1 tablet (50 mg total) by mouth every 6 (six) hours as needed for moderate pain. Patient taking differently: Take 50 mg by mouth 3 (three) times daily.  12/01/15  Yes Gladstone Lighter, MD    Allergies Penicillins  Family History  Problem Relation Age of Onset  . Colon cancer Mother   . Diabetes Father   . Diabetes Brother     Social History Social  History  Substance Use Topics  . Smoking status: Former Research scientist (life sciences)  . Smokeless tobacco: Never Used  . Alcohol use No    Review of Systems Unable to obtain secondary to dementia  ____________________________________________   PHYSICAL EXAM:  VITAL SIGNS: ED Triage Vitals  Enc Vitals Group     BP 04/16/16 1638 133/72     Pulse Rate 04/16/16 1638 91     Resp 04/16/16 1638 18     Temp 04/16/16 1645 97.8 F (36.6 C)     Temp Source 04/16/16 1645 Oral     SpO2 04/16/16 1638 100 %     Weight 04/16/16 1638  121 lb 6.4 oz (55.1 kg)   Constitutional: Awake and alert. Oriented to self. Eyes: Conjunctivae are normal. PERRL. Normal extraocular movements. ENT   Head: Normocephalic and atraumatic.   Nose: No congestion/rhinnorhea.   Mouth/Throat: Mucous membranes are moist.   Neck: No stridor. Hematological/Lymphatic/Immunilogical: No cervical lymphadenopathy. Cardiovascular: Normal rate, regular rhythm.  No murmurs, rubs, or gallops. Respiratory: Normal respiratory effort without tachypnea nor retractions. Breath sounds are clear and equal bilaterally. No wheezes/rales/rhonchi. Gastrointestinal: Soft and nontender. No distention.  Genitourinary: Deferred Musculoskeletal: Normal range of motion in all extremities. No joint effusions.  No lower extremity tenderness nor edema. Neurologic:  Oriented to self. Awake. In no acute distress. Moving all extremities. Skin:  Skin is warm, dry and intact. No rash noted. Psychiatric: Mood and affect are normal. Speech and behavior are normal. Patient exhibits appropriate insight and judgment.  ____________________________________________    LABS (pertinent positives/negatives)  Labs Reviewed  BASIC METABOLIC PANEL - Abnormal; Notable for the following:       Result Value   BUN 22 (*)    GFR calc non Af Amer 58 (*)    All other components within normal limits  CBC - Abnormal; Notable for the following:    RBC 3.69 (*)    Hemoglobin 12.6 (*)    HCT 35.3 (*)    MCH 34.2 (*)    All other components within normal limits  URINALYSIS COMPLETEWITH MICROSCOPIC ([facility] ONLY) - Abnormal; Notable for the following:    Color, Urine AMBER (*)    APPearance CLEAR (*)    Ketones, ur TRACE (*)    Protein, ur 30 (*)    Squamous Epithelial / LPF 0-5 (*)    All other components within normal limits  URINE CULTURE  TROPONIN I     ____________________________________________   EKG  I, Nance Pear, attending physician, personally viewed and  interpreted this EKG  EKG Time: 1645 Rate: 96 Rhythm: normal sinus rhythm Axis: normal Intervals: qtc 427 QRS: narrow ST changes: no st elevation Impression: normal ekg   ____________________________________________    RADIOLOGY  CT head IMPRESSION: 1. No acute intracranial abnormality or acute traumatic injury identified. 2. Chronic small and medium-sized vessel ischemia. Stable non contrast CT appearance of the brain since March.  CXR IMPRESSION: 1. No acute cardiopulmonary process. 2. Hiatal hernia and elevation LEFT hemidiaphragm.   ____________________________________________   PROCEDURES  Procedures  ____________________________________________   INITIAL IMPRESSION / ASSESSMENT AND PLAN / ED COURSE  Pertinent labs & imaging results that were available during my care of the patient were reviewed by me and considered in my medical decision making (see chart for details).  Patient presented to the emergency department today after an unwitnessed fall. No significant traumatic injuries identified on evaluation. The patient did undergo CT head, chest x-ray as well as blood work and urine.  Only concerning finding is possibility of urinary tract infection. Will treat. ____________________________________________   FINAL CLINICAL IMPRESSION(S) / ED DIAGNOSES  Final diagnoses:  Fall, initial encounter  UTI (lower urinary tract infection)     Note: This dictation was prepared with Dragon dictation. Any transcriptional errors that result from this process are unintentional    Nance Pear, MD 04/16/16 2135

## 2016-04-18 LAB — URINE CULTURE: CULTURE: NO GROWTH

## 2016-04-20 ENCOUNTER — Inpatient Hospital Stay
Admission: EM | Admit: 2016-04-20 | Discharge: 2016-04-24 | DRG: 481 | Disposition: A | Discharge status: Hospice | Attending: Internal Medicine | Admitting: Internal Medicine

## 2016-04-20 ENCOUNTER — Emergency Department

## 2016-04-20 DIAGNOSIS — Z515 Encounter for palliative care: Secondary | ICD-10-CM | POA: Diagnosis present

## 2016-04-20 DIAGNOSIS — Z87891 Personal history of nicotine dependence: Secondary | ICD-10-CM | POA: Diagnosis not present

## 2016-04-20 DIAGNOSIS — Z7401 Bed confinement status: Secondary | ICD-10-CM

## 2016-04-20 DIAGNOSIS — Z8546 Personal history of malignant neoplasm of prostate: Secondary | ICD-10-CM

## 2016-04-20 DIAGNOSIS — F0390 Unspecified dementia without behavioral disturbance: Secondary | ICD-10-CM

## 2016-04-20 DIAGNOSIS — D649 Anemia, unspecified: Secondary | ICD-10-CM | POA: Diagnosis present

## 2016-04-20 DIAGNOSIS — F0280 Dementia in other diseases classified elsewhere without behavioral disturbance: Secondary | ICD-10-CM | POA: Diagnosis present

## 2016-04-20 DIAGNOSIS — Z681 Body mass index (BMI) 19 or less, adult: Secondary | ICD-10-CM | POA: Diagnosis not present

## 2016-04-20 DIAGNOSIS — S72142A Displaced intertrochanteric fracture of left femur, initial encounter for closed fracture: Secondary | ICD-10-CM | POA: Diagnosis present

## 2016-04-20 DIAGNOSIS — Z86711 Personal history of pulmonary embolism: Secondary | ICD-10-CM | POA: Diagnosis not present

## 2016-04-20 DIAGNOSIS — Z9889 Other specified postprocedural states: Secondary | ICD-10-CM

## 2016-04-20 DIAGNOSIS — K219 Gastro-esophageal reflux disease without esophagitis: Secondary | ICD-10-CM | POA: Diagnosis present

## 2016-04-20 DIAGNOSIS — Z79899 Other long term (current) drug therapy: Secondary | ICD-10-CM | POA: Diagnosis not present

## 2016-04-20 DIAGNOSIS — Z66 Do not resuscitate: Secondary | ICD-10-CM | POA: Diagnosis present

## 2016-04-20 DIAGNOSIS — R634 Abnormal weight loss: Secondary | ICD-10-CM | POA: Diagnosis present

## 2016-04-20 DIAGNOSIS — W050XXA Fall from non-moving wheelchair, initial encounter: Secondary | ICD-10-CM | POA: Diagnosis present

## 2016-04-20 DIAGNOSIS — Z7982 Long term (current) use of aspirin: Secondary | ICD-10-CM | POA: Diagnosis not present

## 2016-04-20 DIAGNOSIS — I959 Hypotension, unspecified: Secondary | ICD-10-CM | POA: Diagnosis not present

## 2016-04-20 DIAGNOSIS — Y92129 Unspecified place in nursing home as the place of occurrence of the external cause: Secondary | ICD-10-CM

## 2016-04-20 DIAGNOSIS — R739 Hyperglycemia, unspecified: Secondary | ICD-10-CM | POA: Diagnosis present

## 2016-04-20 DIAGNOSIS — M25552 Pain in left hip: Secondary | ICD-10-CM

## 2016-04-20 DIAGNOSIS — G308 Other Alzheimer's disease: Secondary | ICD-10-CM | POA: Diagnosis present

## 2016-04-20 DIAGNOSIS — Z880 Allergy status to penicillin: Secondary | ICD-10-CM

## 2016-04-20 DIAGNOSIS — Z993 Dependence on wheelchair: Secondary | ICD-10-CM

## 2016-04-20 DIAGNOSIS — Z8781 Personal history of (healed) traumatic fracture: Secondary | ICD-10-CM

## 2016-04-20 DIAGNOSIS — S72002A Fracture of unspecified part of neck of left femur, initial encounter for closed fracture: Secondary | ICD-10-CM | POA: Diagnosis present

## 2016-04-20 LAB — CBC WITH DIFFERENTIAL/PLATELET
Basophils Absolute: 0 10*3/uL (ref 0–0.1)
Basophils Relative: 0 %
EOS ABS: 0 10*3/uL (ref 0–0.7)
EOS PCT: 0 %
HCT: 33.7 % — ABNORMAL LOW (ref 40.0–52.0)
HEMOGLOBIN: 11.6 g/dL — AB (ref 13.0–18.0)
LYMPHS ABS: 2 10*3/uL (ref 1.0–3.6)
Lymphocytes Relative: 22 %
MCH: 33.6 pg (ref 26.0–34.0)
MCHC: 34.5 g/dL (ref 32.0–36.0)
MCV: 97.5 fL (ref 80.0–100.0)
MONOS PCT: 12 %
Monocytes Absolute: 1.1 10*3/uL — ABNORMAL HIGH (ref 0.2–1.0)
Neutro Abs: 6.2 10*3/uL (ref 1.4–6.5)
Neutrophils Relative %: 66 %
PLATELETS: 168 10*3/uL (ref 150–440)
RBC: 3.46 MIL/uL — ABNORMAL LOW (ref 4.40–5.90)
RDW: 12.8 % (ref 11.5–14.5)
WBC: 9.3 10*3/uL (ref 3.8–10.6)

## 2016-04-20 LAB — BASIC METABOLIC PANEL
Anion gap: 7 (ref 5–15)
BUN: 14 mg/dL (ref 6–20)
CHLORIDE: 102 mmol/L (ref 101–111)
CO2: 33 mmol/L — AB (ref 22–32)
CREATININE: 0.88 mg/dL (ref 0.61–1.24)
Calcium: 9.5 mg/dL (ref 8.9–10.3)
GFR calc Af Amer: >60 mL/min (ref >60)
GFR calc non Af Amer: >60 mL/min (ref >60)
GLUCOSE: 120 mg/dL — AB (ref 65–99)
Potassium: 4 mmol/L (ref 3.5–5.1)
Sodium: 142 mmol/L (ref 135–145)

## 2016-04-20 MED ORDER — POLYETHYLENE GLYCOL 3350 17 G PO PACK: 17.0000 g | PACK | Freq: Every day | ORAL | Status: DC | PRN

## 2016-04-20 MED ORDER — OXYCODONE HCL 5 MG PO TABS
5.0000 mg | ORAL_TABLET | ORAL | Status: DC | PRN
  Administered 2016-04-22 – 2016-04-23 (×4): 5 mg via ORAL
  Filled 2016-04-20 (×5): qty 1

## 2016-04-20 MED ORDER — MEMANTINE HCL 5 MG PO TABS
10.0000 mg | ORAL_TABLET | Freq: Every day | ORAL | Status: DC
Start: 2016-04-20 — End: 2016-04-24
  Administered 2016-04-22 – 2016-04-24 (×2): 10 mg via ORAL
  Filled 2016-04-20 (×3): qty 2

## 2016-04-20 MED ORDER — ONDANSETRON HCL 4 MG/2ML IJ SOLN: 4.0000 mg | Freq: Four times a day (QID) | INTRAMUSCULAR | Status: DC | PRN

## 2016-04-20 MED ORDER — MORPHINE SULFATE (PF) 2 MG/ML IV SOLN: 1.0000 mg | INTRAVENOUS | Status: DC | PRN

## 2016-04-20 MED ORDER — ACETAMINOPHEN 325 MG PO TABS: 650.0000 mg | ORAL_TABLET | Freq: Four times a day (QID) | ORAL | Status: DC | PRN

## 2016-04-20 MED ORDER — FENTANYL CITRATE (PF) 100 MCG/2ML IJ SOLN
25.0000 ug | Freq: Once | INTRAMUSCULAR | Status: AC
  Administered 2016-04-20: 25 ug via INTRAVENOUS
  Filled 2016-04-20: qty 2

## 2016-04-20 MED ORDER — DONEPEZIL HCL 5 MG PO TABS
10.0000 mg | ORAL_TABLET | Freq: Every day | ORAL | Status: DC
  Administered 2016-04-22 – 2016-04-24 (×2): 10 mg via ORAL
  Filled 2016-04-20 (×3): qty 2

## 2016-04-20 MED ORDER — ACETAMINOPHEN 650 MG RE SUPP: 650.0000 mg | Freq: Four times a day (QID) | RECTAL | Status: DC | PRN

## 2016-04-20 MED ORDER — ACETAMINOPHEN 325 MG PO TABS
650.0000 mg | ORAL_TABLET | Freq: Once | ORAL | Status: DC
  Filled 2016-04-20: qty 2

## 2016-04-20 MED ORDER — DOCUSATE SODIUM 100 MG PO CAPS: 100.0000 mg | ORAL_CAPSULE | Freq: Two times a day (BID) | ORAL | Status: DC

## 2016-04-20 MED ORDER — BISACODYL 10 MG RE SUPP: 10.0000 mg | Freq: Every day | RECTAL | Status: DC | PRN

## 2016-04-20 MED ORDER — ALBUTEROL SULFATE (2.5 MG/3ML) 0.083% IN NEBU: 2.5000 mg | INHALATION_SOLUTION | RESPIRATORY_TRACT | Status: DC | PRN

## 2016-04-20 MED ORDER — SENNA 8.6 MG PO TABS
1.0000 | ORAL_TABLET | Freq: Two times a day (BID) | ORAL | Status: DC
  Administered 2016-04-22 – 2016-04-24 (×3): 8.6 mg via ORAL
  Filled 2016-04-20 (×5): qty 1

## 2016-04-20 MED ORDER — ONDANSETRON HCL 4 MG PO TABS: 4.0000 mg | ORAL_TABLET | Freq: Four times a day (QID) | ORAL | Status: DC | PRN

## 2016-04-20 NOTE — ED Notes (Signed)
Attempted to call report but floor reports they are giving report at this time and will call this RN back as soon as possible.

## 2016-04-20 NOTE — Progress Notes (Signed)
ED Visit made. Patient is currently followed by [facility] with a hospice diagnosis of Alzheimer's disease. He is a DNR code. Hospital care team made aware. Patient fell last evening and began complaining of left hip pain earlier today. Hospice nurse visited, portable x ray requested and completed. This showed a femoral fracture.Pain was controlled with liquid morphine prior to transfer.  Family chose to have patient transfered to the ED for evaluation and orthopedic consult.  Patient seen lying in bed, alert, pleasantly confused. Daughter Judeth Porch present. Emotional support given. She confirmed her choice for orthopedic consult. Chart notes reviewed, left hip xray and head CT are pending. Hospice will follow through final disposition. Thank you. Flo Shanks RN, BSN, [facility], [facility] [phone] c

## 2016-04-20 NOTE — ED Triage Notes (Signed)
Pt fell yesterday and had xray of left hip, today confirmed fx.  Pt alert and pleasant. Confused, staff reports that is his norm.

## 2016-04-20 NOTE — H&P (Signed)
[facility] NAME: Jorge Moore    MR#:  [id number]  DATE OF BIRTH:  Sep 19, 1923  DATE OF ADMISSION:  04/20/2016  PRIMARY CARE PHYSICIAN: Leonel Ramsay, MD   REQUESTING/REFERRING PHYSICIAN: Dr. Quentin Cornwall  CHIEF COMPLAINT:   Chief Complaint  Patient presents with  . Hip Pain    HISTORY OF PRESENT ILLNESS:  Jorge Moore  is a 82 y.o. male with a known history of dementia, bed and wheel chair bound on hospice her after fall and found To have left hip fracture. Patient had right hip fracture in February 2017 which was fixed. Patient seems to have tolerated that procedure well. He has had 3 visits to the emergency room due to falls in the last week. Presently patient is drowsy and confused after pain medications. He has confusion at baseline. Daughter at bedside gives history. He has lost weight over the last 6 months. Good appetite as per daughter. Tries to get out of the wheelchair and falls multiple times.  PAST MEDICAL HISTORY:   Past Medical History:  Diagnosis Date  . Ataxia   . Atrial fibrillation ([place])   . Dementia   . Depressed   . Femur fracture ([place])   . GERD (gastroesophageal reflux disease)   . PE (pulmonary embolism)   . Prostate cancer ([place])     PAST SURGICAL HISTORY:   Past Surgical History:  Procedure Laterality Date  . INTRAMEDULLARY (IM) NAIL INTERTROCHANTERIC Right 11/04/2015   Procedure: INTRAMEDULLARY (IM) NAIL INTERTROCHANTRIC;  Surgeon: Dereck Leep, MD;  Location: [facility] ORS;  Service: Orthopedics;  Laterality: Right;    SOCIAL HISTORY:   Social History  Substance Use Topics  . Smoking status: Former Research scientist (life sciences)  . Smokeless tobacco: Never Used  . Alcohol use No    FAMILY HISTORY:   Family History  Problem Relation Age of Onset  . Colon cancer Mother   . Diabetes Father   . Diabetes Brother     DRUG ALLERGIES:   Allergies  Allergen Reactions  . Penicillins Other (See  Comments)    Reaction:  Unknown     REVIEW OF SYSTEMS:   ROS  unobtainable due to dementia MEDICATIONS AT HOME:   Prior to Admission medications   Medication Sig Start Date End Date Taking? Authorizing Provider  acetaminophen (TYLENOL) 500 MG tablet Take 500 mg by mouth 3 (three) times daily.    Historical Provider, MD  aspirin 81 MG chewable tablet Chew 81 mg by mouth daily. Reported on 11/30/2015    Historical Provider, MD  cephALEXin (KEFLEX) 500 MG capsule Take 1 capsule (500 mg total) by mouth 3 (three) times daily. 04/16/16   Nance Pear, MD  Cholecalciferol (VITAMIN D) 2000 units tablet Take 4,000 Units by mouth daily.    Historical Provider, MD  donepezil (ARICEPT) 10 MG tablet Take 10 mg by mouth daily.     Historical Provider, MD  feeding supplement, ENSURE ENLIVE, (ENSURE ENLIVE) LIQD Take 237 mLs by mouth 2 (two) times daily as needed.    Historical Provider, MD  Melatonin 3 MG TABS Take 1 tablet by mouth at bedtime as needed (sleep).     Historical Provider, MD  memantine (NAMENDA) 10 MG tablet Take 10 mg by mouth daily.    Historical Provider, MD  Loma Boston (OYSTER CALCIUM) 500 MG TABS tablet Take 500 mg of elemental calcium by mouth 2 (two) times daily.    Historical Provider, MD  protein supplement (  RESOURCE BENEPROTEIN) POWD Take 6 g by mouth 2 (two) times daily as needed.     Historical Provider, MD  senna (SENOKOT) 8.6 MG TABS tablet Take 1 tablet by mouth every other day.     Historical Provider, MD  traMADol (ULTRAM) 50 MG tablet Take 1 tablet (50 mg total) by mouth every 6 (six) hours as needed for moderate pain. Patient taking differently: Take 50 mg by mouth 3 (three) times daily.  12/01/15   Gladstone Lighter, MD     VITAL SIGNS:  Blood pressure 138/71, pulse 94, temperature 98.7 F (37.1 C), temperature source Oral, resp. rate 14, height 5\' 8"  (1.727 m), weight 63.5 kg (140 lb), SpO2 97 %.  PHYSICAL EXAMINATION:  Physical Exam  GENERAL:  82  y.o.-year-old patient lying in the bed with no acute distress.  EYES: Pupils equal, round, reactive to light and accommodation. No scleral icterus. Extraocular muscles intact.  HEENT: Head atraumatic, normocephalic. Oropharynx and nasopharynx clear. No oropharyngeal erythema, moist oral mucosa  NECK:  Supple, no jugular venous distention. No thyroid enlargement, no tenderness.  LUNGS: Normal breath sounds bilaterally, no wheezing, rales, rhonchi. No use of accessory muscles of respiration.  CARDIOVASCULAR: S1, S2 normal. No murmurs, rubs, or gallops.  ABDOMEN: Soft, nontender, nondistended. Bowel sounds present. No organomegaly or mass.  EXTREMITIES: No pedal edema, cyanosis, or clubbing. + 2 pedal & radial pulses b/l.   NEUROLOGIC: Cranial nerves II through XII are intact. No focal Motor or sensory deficits appreciated b/l PSYCHIATRIC: The patient is alert and oriented x 3. Good affect.  SKIN: No obvious rash, lesion, or ulcer.   LABORATORY PANEL:   CBC  Recent Labs Lab 04/20/16 1519  WBC 9.3  HGB 11.6*  HCT 33.7*  PLT 168   ------------------------------------------------------------------------------------------------------------------  Chemistries   Recent Labs Lab 04/20/16 1519  NA 142  K 4.0  CL 102  CO2 33*  GLUCOSE 120*  BUN 14  CREATININE 0.88  CALCIUM 9.5   ------------------------------------------------------------------------------------------------------------------  Cardiac Enzymes  Recent Labs Lab 04/16/16 1653  TROPONINI <0.03   ------------------------------------------------------------------------------------------------------------------  RADIOLOGY:  Dg Hip Unilat With Pelvis 2-3 Views Left  Result Date: 04/20/2016 CLINICAL DATA:  Known LEFT hip fracture, prior RIGHT hip surgery in March, acute LEFT hip pain EXAM: DG HIP (WITH OR WITHOUT PELVIS) 2-3V LEFT COMPARISON:  11/29/2015 FINDINGS: Marked osseous demineralization. Orthopedic hardware  proximal RIGHT femur post ORIF. Intertrochanteric fracture proximal LEFT femur new since previous exam with varus angulation and mild displacement. Mild degenerative changes of LEFT hip joint without dislocation. Pelvis appears intact. Surgical clips in pelvis bilaterally. IMPRESSION: Displaced and angulated intertrochanteric fracture LEFT femur. Osseous demineralization with mild degenerative changes LEFT hip joint. Electronically Signed   By: Lavonia Dana M.D.   On: 04/20/2016 16:57     IMPRESSION AND PLAN:   * Left hip fracture Explained to the daughter regarding high risk for surgery. Waiting for orthopedics. No further investigation needed if going for surgery. Pain medications added. Patient is nothing by mouth. Physical therapy after surgery. Presently patient is in assisted living and will need skilled nursing facility at discharge.  * Dementia Continue medications  * DVT prophylaxis SCDs ordered. Lovenox to be ordered by orthopedics depending on surgery.  All the records are reviewed and case discussed with ED provider. Management plans discussed with the patient, family and they are in agreement.  CODE STATUS: DNR  TOTAL TIME TAKING CARE OF THIS PATIENT: 40 minutes.   Hillary Bow R M.D on 04/20/2016  at 5:57 PM  Between 7am to 6pm - Pager - [phone]  After 6pm go to [email or web address] - password [email or web address] [facility]  [facility]  Office  [phone]  CC: Primary care physician; FITZGERALD, DAVID Mamie Nick, MD  Note: This dictation was prepared with Dragon dictation along with smaller phrase technology. Any transcriptional errors that result from this process are unintentional.

## 2016-04-20 NOTE — Consult Note (Signed)
ORTHOPAEDIC CONSULTATION  REQUESTING PHYSICIAN: Hillary Bow, MD  Chief Complaint:   Left hip pain.  History of Present Illness: Jorge Moore is a 82 y.o. male with multiple medical problems including dementia, atrial fibrillation, prostate cancer, and gastroesophageal reflux disease who lives in a rehabilitation facility and is a minimal ambulator. Apparently he fell out of his wheelchair today while trying to get up and walk, landing on his left hip. He was brought to the emergency room where x-rays demonstrated a varus angulated intertrochanteric/basicervical fracture of his left hip. The patient was admitted for pain control and definitive management of his injury. There does not appear to be any associated injuries resulting from this fall. The patient is status post a trochanteric femoral nailing of his right hip in February, 2017, by Dr. Marry Guan, from which he appeared to do well.  Past Medical History:  Diagnosis Date  . Ataxia   . Atrial fibrillation ([place])   . Dementia   . Depressed   . Femur fracture ([place])   . GERD (gastroesophageal reflux disease)   . PE (pulmonary embolism)   . Prostate cancer [facility])    Past Surgical History:  Procedure Laterality Date  . INTRAMEDULLARY (IM) NAIL INTERTROCHANTERIC Right 11/04/2015   Procedure: INTRAMEDULLARY (IM) NAIL INTERTROCHANTRIC;  Surgeon: Dereck Leep, MD;  Location: [facility] ORS;  Service: Orthopedics;  Laterality: Right;   Social History   Social History  . Marital status: Widowed    Spouse name: N/A  . Number of children: N/A  . Years of education: N/A   Social History Main Topics  . Smoking status: Former Research scientist (life sciences)  . Smokeless tobacco: Never Used  . Alcohol use No  . Drug use: No  . Sexual activity: Not Currently   Other Topics Concern  . Not on file   Social History Narrative  . No narrative on file   Family History  Problem Relation Age of Onset  .  Colon cancer Mother   . Diabetes Father   . Diabetes Brother    Allergies  Allergen Reactions  . Penicillins Other (See Comments)    Reaction:  Unknown    Prior to Admission medications   Medication Sig Start Date End Date Taking? Authorizing Provider  acetaminophen (TYLENOL) 500 MG tablet Take 500 mg by mouth 3 (three) times daily.   Yes Historical Provider, MD  aspirin 81 MG chewable tablet Chew 81 mg by mouth daily. Reported on 11/30/2015   Yes Historical Provider, MD  Cholecalciferol (VITAMIN D) 2000 units tablet Take 4,000 Units by mouth daily.   Yes Historical Provider, MD  donepezil (ARICEPT) 10 MG tablet Take 10 mg by mouth daily.    Yes Historical Provider, MD  feeding supplement, ENSURE ENLIVE, (ENSURE ENLIVE) LIQD Take 237 mLs by mouth 2 (two) times daily as needed.   Yes Historical Provider, MD  memantine (NAMENDA) 10 MG tablet Take 10 mg by mouth daily.   Yes Historical Provider, MD  morphine (ROXANOL) 20 MG/ML concentrated solution Take 5 mg by mouth every hour as needed for severe pain or shortness of breath.   Yes Historical Provider, MD  Loma Boston (OYSTER CALCIUM) 500 MG TABS tablet Take 500 mg of elemental calcium by mouth 2 (two) times daily.   Yes Historical Provider, MD  senna (SENOKOT) 8.6 MG TABS tablet Take 1 tablet by mouth every other day.    Yes Historical Provider, MD  traMADol (ULTRAM) 50 MG tablet Take 1 tablet (50 mg total) by mouth every  6 (six) hours as needed for moderate pain. Patient taking differently: Take 50 mg by mouth 3 (three) times daily.  12/01/15  Yes Gladstone Lighter, MD  Melatonin 3 MG TABS Take 1 tablet by mouth at bedtime as needed (sleep).     Historical Provider, MD   Ct Head Wo Contrast  Result Date: 04/20/2016 CLINICAL DATA:  Fall. EXAM: CT HEAD WITHOUT CONTRAST TECHNIQUE: Contiguous axial images were obtained from the base of the skull through the vertex without intravenous contrast. COMPARISON:  None. 04/16/2016 FINDINGS: Brain: There  is mild diffuse low-attenuation within the subcortical and periventricular white matter compatible with chronic microvascular disease. Chronic right basal ganglia lacunar infarct is unchanged. Prominence of the sulci and ventricles are identified compatible with brain atrophy. Encephalomalacia involving the left cerebellar hemisphere is again noted, unchanged. No evidence of acute infarction, hemorrhage, extra-axial collection,or mass effect. Vascular: No hyperdense vessel or unexpected calcification. Skull: Negative for fracture or focal lesion. Sinuses/Orbits: No acute findings. Other: None. IMPRESSION: 1. No acute intracranial abnormalities. No significant change from 04/16/2016. 2. Chronic microvascular disease and brain atrophy. Electronically Signed   By: Kerby Moors M.D.   On: 04/20/2016 18:18   Dg Hip Unilat With Pelvis 2-3 Views Left  Result Date: 04/20/2016 CLINICAL DATA:  Known LEFT hip fracture, prior RIGHT hip surgery in March, acute LEFT hip pain EXAM: DG HIP (WITH OR WITHOUT PELVIS) 2-3V LEFT COMPARISON:  11/29/2015 FINDINGS: Marked osseous demineralization. Orthopedic hardware proximal RIGHT femur post ORIF. Intertrochanteric fracture proximal LEFT femur new since previous exam with varus angulation and mild displacement. Mild degenerative changes of LEFT hip joint without dislocation. Pelvis appears intact. Surgical clips in pelvis bilaterally. IMPRESSION: Displaced and angulated intertrochanteric fracture LEFT femur. Osseous demineralization with mild degenerative changes LEFT hip joint. Electronically Signed   By: Lavonia Dana M.D.   On: 04/20/2016 16:57    Positive ROS: All other systems have been reviewed and were otherwise negative with the exception of those mentioned in the HPI and as above.  Physical Exam: General:  Alert, no acute distress Psychiatric:  Patient is not competent for consent with normal mood and affect   Cardiovascular:  No pedal edema Respiratory:  No wheezing,  non-labored breathing GI:  Abdomen is soft and non-tender Skin:  No lesions in the area of chief complaint Neurologic:  Sensation intact distally Lymphatic:  No axillary or cervical lymphadenopathy  Orthopedic Exam:  Orthopedic examination is limited to the left hip and lower extremity. Skin inspection around the left hip is notable for some swelling, but otherwise is unremarkable. There is no bruising, ecchymosis, or other skin violations. He has mild tenderness to palpation over the lateral aspect of the left hip. He has more moderate pain with any attempted active passive motion of the hip. Sensation appears to be intact to his left foot. He has good capillary refill to his left foot.  X-rays:  X-rays of the pelvis and left hip are available for review. The findings are as described above. There is a basicervical/intertrochanteric fracture of the left hip with varus angulation. No significant degenerative changes are noted.  Assessment: Left hip fracture.  Plan: The treatment options are discussed with the patient's daughter, including both surgical and nonsurgical options. The patient's daughter is quite concerned by the frailty and medical condition of the patient and is not sure that the patient would be able to survive a surgical procedure. She would like to discuss the treatment options further with her other sisters before  deciding on a plan for her father. Therefore, we will let the patient eat tonight and will discuss the treatment plan further with the patient's family tomorrow. If surgery is elected, it most likely will be done on Sunday morning.  Thank you for asking me to participate in the care of this most unfortunate man. I will be happy to follow him with you.   Pascal Lux, MD  Beeper #:  [phone]  04/20/2016 8:35 PM

## 2016-04-20 NOTE — ED Notes (Signed)
MD at bedside. 

## 2016-04-20 NOTE — ED Provider Notes (Signed)
[facility] Emergency Department Provider Note    None    (approximate)  I have reviewed the triage vital signs and the nursing notes.   HISTORY  Chief Complaint Hip Pain    HPI Jorge Moore is a 82 y.o. male this is a pleasant elderly male with Alzheimer's dementia who presents status post fall at memory care physician so he was with complaint of acute left hip pain and deformity.Description of fall Limited due to patient's underlying dementia review of nursing report shows the patient fell yesterday and had outpatient hip x-ray which suggested evidence of hip fracture. Patient does arrive with a DO NOT RESUSCITATE signed. Denies any other complaints at this time.   Past Medical History:  Diagnosis Date  . Ataxia   . Atrial fibrillation ([place])   . Dementia   . Depressed   . Femur fracture ([place])   . GERD (gastroesophageal reflux disease)   . PE (pulmonary embolism)   . Prostate cancer [facility])     Patient Active Problem List   Diagnosis Date Noted  . Syncope 11/30/2015  . Atrial fibrillation ([place]) 11/11/2015  . Degeneration of intervertebral disc of lumbosacral region 11/11/2015  . Atheroma, skin 11/11/2015  . Hip fracture, right ([place]) 11/03/2015  . Hip fracture ([place]) 11/03/2015  . Cerebral ventriculomegaly 07/22/2015  . Abnormal computed tomography of head 05/25/2015  . Fall 10/05/2014  . Appendicular ataxia 10/05/2014  . 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 Pulmonary embolism ([place]) 11/16/2010    Past Surgical History:  Procedure Laterality Date  . INTRAMEDULLARY (IM) NAIL INTERTROCHANTERIC Right 11/04/2015   Procedure: INTRAMEDULLARY (IM) NAIL INTERTROCHANTRIC;  Surgeon: Dereck Leep, MD;  Location: [facility] ORS;  Service: Orthopedics;  Laterality: Right;    Prior to Admission medications   Medication Sig Start Date End Date Taking? Authorizing Provider  acetaminophen (TYLENOL) 500 MG tablet Take 500 mg by mouth 3  (three) times daily.    Historical Provider, MD  aspirin 81 MG chewable tablet Chew 81 mg by mouth daily. Reported on 11/30/2015    Historical Provider, MD  cephALEXin (KEFLEX) 500 MG capsule Take 1 capsule (500 mg total) by mouth 3 (three) times daily. 04/16/16   Nance Pear, MD  Cholecalciferol (VITAMIN D) 2000 units tablet Take 4,000 Units by mouth daily.    Historical Provider, MD  donepezil (ARICEPT) 10 MG tablet Take 10 mg by mouth daily.     Historical Provider, MD  feeding supplement, ENSURE ENLIVE, (ENSURE ENLIVE) LIQD Take 237 mLs by mouth 2 (two) times daily as needed.    Historical Provider, MD  Melatonin 3 MG TABS Take 1 tablet by mouth at bedtime as needed (sleep).     Historical Provider, MD  memantine (NAMENDA) 10 MG tablet Take 10 mg by mouth daily.    Historical Provider, MD  Loma Boston (OYSTER CALCIUM) 500 MG TABS tablet Take 500 mg of elemental calcium by mouth 2 (two) times daily.    Historical Provider, MD  protein supplement (RESOURCE BENEPROTEIN) POWD Take 6 g by mouth 2 (two) times daily as needed.     Historical Provider, MD  senna (SENOKOT) 8.6 MG TABS tablet Take 1 tablet by mouth every other day.     Historical Provider, MD  traMADol (ULTRAM) 50 MG tablet Take 1 tablet (50 mg total) by mouth every 6 (six) hours as needed for moderate pain. Patient taking differently: Take 50 mg by mouth 3 (three) times daily.  12/01/15  Gladstone Lighter, MD    Allergies Penicillins  Family History  Problem Relation Age of Onset  . Colon cancer Mother   . Diabetes Father   . Diabetes Brother     Social History Social History  Substance Use Topics  . Smoking status: Former Research scientist (life sciences)  . Smokeless tobacco: Never Used  . Alcohol use No    Review of Systems Unable to obtain due to underlying dementia. ____________________________________________   PHYSICAL EXAM:  VITAL SIGNS: Vitals:   04/20/16 1730 04/20/16 1930  BP: 138/71 128/66  Pulse: 94 94  Resp: 14 12    Temp:      Constitutional: Alert and disoriented. Frail appearing but in  NAD Eyes: Conjunctivae are normal. PERRL. EOMI. Head: Atraumatic. Nose: No congestion/rhinnorhea. Mouth/Throat: Mucous membranes are moist.  Oropharynx non-erythematous. Neck: No stridor. Painless ROM. No cervical spine tenderness to palpation Hematological/Lymphatic/Immunilogical: No cervical lymphadenopathy. Cardiovascular: Normal rate, regular rhythm. Grossly normal heart sounds.  Good peripheral circulation. Respiratory: Normal respiratory effort.  No retractions. Lungs CTAB. Gastrointestinal: Soft and nontender. No distention. No abdominal bruits. No CVA tenderness. Genitourinary:  Musculoskeletal: ttp of left hip with log roll.  Left LE slightly shortened.  No ecchymosis or swelling distally. 2+ pulses distally.  SILT.  Compartment soft.. Neurologic:  Normal speech and language. No gross focal neurologic deficits are appreciated. No gait instability. Skin:  Skin is warm, dry and intact. No rash noted.   ____________________________________________   LABS (all labs ordered are listed, but only abnormal results are displayed)  Results for orders placed or performed during the hospital encounter of 04/20/16 (from the past 24 hour(s))  Basic metabolic panel     Status: Abnormal   Collection Time: 04/20/16  3:19 PM  Result Value Ref Range   Sodium 142 135 - 145 mmol/L   Potassium 4.0 3.5 - 5.1 mmol/L   Chloride 102 101 - 111 mmol/L   CO2 33 (H) 22 - 32 mmol/L   Glucose, Bld 120 (H) 65 - 99 mg/dL   BUN 14 6 - 20 mg/dL   Creatinine, Ser 0.88 0.61 - 1.24 mg/dL   Calcium 9.5 8.9 - 10.3 mg/dL   GFR calc non Af Amer >60 >60 mL/min   GFR calc Af Amer >60 >60 mL/min   Anion gap 7 5 - 15  CBC WITH DIFFERENTIAL     Status: Abnormal   Collection Time: 04/20/16  3:19 PM  Result Value Ref Range   WBC 9.3 3.8 - 10.6 K/uL   RBC 3.46 (L) 4.40 - 5.90 MIL/uL   Hemoglobin 11.6 (L) 13.0 - 18.0 g/dL   HCT 33.7 (L)  40.0 - 52.0 %   MCV 97.5 80.0 - 100.0 fL   MCH 33.6 26.0 - 34.0 pg   MCHC 34.5 32.0 - 36.0 g/dL   RDW 12.8 11.5 - 14.5 %   Platelets 168 150 - 440 K/uL   Neutrophils Relative % 66 %   Neutro Abs 6.2 1.4 - 6.5 K/uL   Lymphocytes Relative 22 %   Lymphs Abs 2.0 1.0 - 3.6 K/uL   Monocytes Relative 12 %   Monocytes Absolute 1.1 (H) 0.2 - 1.0 K/uL   Eosinophils Relative 0 %   Eosinophils Absolute 0.0 0 - 0.7 K/uL   Basophils Relative 0 %   Basophils Absolute 0.0 0 - 0.1 K/uL   ____________________________________________  EKG None____________________________________________  RADIOLOGY  X-ray hip with evidence of acute intertrochanteric fracture. CT head with no evidence of acute intracranial abnormality.  ___________________________________________   PROCEDURES  Procedure(s) performed: none    Critical Care performed: no ____________________________________________   INITIAL IMPRESSION / ASSESSMENT AND PLAN / ED COURSE  Pertinent labs & imaging results that were available during my care of the patient were reviewed by me and considered in my medical decision making (see chart for details).  DDX: fracture, contusion, dislocation, sdh, sah, electrolyte abnormality  Jorge Moore is a 82 y.o. who presents to the ED with complaint of left hip pain after fall yesterday. Patient is currently on hospice. Fracture of left hip ordered concern for fracture. We'll check baseline labs as well as order head CT given her current falls to evaluate for subdural hematoma.  Patient otherwise afebrile name and hemodynamically stable  Clinical Course  Comment By Time  I consulted with the regarding possible operative management for palliative management of the patient's left Intertrochanteric fracture. Merlyn Lot, MD 08/04 [phone]   X-ray with evidence of left intertrochanteric fracture. Patient provided fentanyl as well as Tylenol for pain control. Discussed high risk of operative  management with family in addition to the high mortality of not fixing this fracture. After discussion with family daughter Henrietta Dine) have requested admission to hospital for further pain management and discussion with orthopedics regarding risks benefits of operative management. This is appropriate given the patient's injury and discomfort. Spoke with hospitalist who agrees to admit patient for further evaluation and management.  ____________________________________________   FINAL CLINICAL IMPRESSION(S) / ED DIAGNOSES  Final diagnoses:  Left hip pain  Closed comminuted intertrochanteric fracture of left femur ([place])  Hospice care patient      NEW MEDICATIONS STARTED DURING THIS VISIT:  New Prescriptions   No medications on file     Note:  This document was prepared using Dragon voice recognition software and may include unintentional dictation errors.    Merlyn Lot, MD 04/20/16 Joen Laura

## 2016-04-20 NOTE — ED Notes (Signed)
Attempted to give pt tylenol but pt was unable to swallow pill. Pills wasted with family in room.

## 2016-04-21 ENCOUNTER — Encounter
Admission: EM | Disposition: A | Discharge status: Hospice | Payer: Self-pay | Source: Home / Self Care | Attending: Internal Medicine

## 2016-04-21 ENCOUNTER — Inpatient Hospital Stay: Admitting: Anesthesiology

## 2016-04-21 ENCOUNTER — Inpatient Hospital Stay

## 2016-04-21 HISTORY — PX: INTRAMEDULLARY (IM) NAIL INTERTROCHANTERIC: SHX5875

## 2016-04-21 LAB — BASIC METABOLIC PANEL
ANION GAP: 4 — AB (ref 5–15)
BUN: 13 mg/dL (ref 6–20)
CO2: 33 mmol/L — ABNORMAL HIGH (ref 22–32)
Calcium: 9.1 mg/dL (ref 8.9–10.3)
Chloride: 104 mmol/L (ref 101–111)
Creatinine, Ser: 0.82 mg/dL (ref 0.61–1.24)
GLUCOSE: 114 mg/dL — AB (ref 65–99)
POTASSIUM: 3.8 mmol/L (ref 3.5–5.1)
Sodium: 141 mmol/L (ref 135–145)

## 2016-04-21 LAB — HEMOGLOBIN A1C: HEMOGLOBIN A1C: 5.5 % (ref 4.0–6.0)

## 2016-04-21 LAB — CBC
HEMATOCRIT: 33.7 % — AB (ref 40.0–52.0)
HEMOGLOBIN: 11.7 g/dL — AB (ref 13.0–18.0)
MCH: 34.2 pg — ABNORMAL HIGH (ref 26.0–34.0)
MCHC: 34.7 g/dL (ref 32.0–36.0)
MCV: 98.4 fL (ref 80.0–100.0)
Platelets: 162 10*3/uL (ref 150–440)
RBC: 3.43 MIL/uL — AB (ref 4.40–5.90)
RDW: 13 % (ref 11.5–14.5)
WBC: 9.7 10*3/uL (ref 3.8–10.6)

## 2016-04-21 LAB — SURGICAL PCR SCREEN
MRSA, PCR: NEGATIVE
STAPHYLOCOCCUS AUREUS: NEGATIVE

## 2016-04-21 SURGERY — FIXATION, FRACTURE, INTERTROCHANTERIC, WITH INTRAMEDULLARY ROD
Anesthesia: General | Laterality: Left | Wound class: Clean

## 2016-04-21 MED ORDER — DOCUSATE SODIUM 100 MG PO CAPS
100.0000 mg | ORAL_CAPSULE | Freq: Two times a day (BID) | ORAL | Status: DC
  Administered 2016-04-22 – 2016-04-24 (×3): 100 mg via ORAL
  Filled 2016-04-21 (×4): qty 1

## 2016-04-21 MED ORDER — PROPOFOL 10 MG/ML IV BOLUS
INTRAVENOUS | Status: DC | PRN
  Administered 2016-04-21: 40 mg via INTRAVENOUS

## 2016-04-21 MED ORDER — BISACODYL 10 MG RE SUPP
10.0000 mg | Freq: Every day | RECTAL | Status: DC | PRN
  Filled 2016-04-21: qty 1

## 2016-04-21 MED ORDER — SODIUM CHLORIDE 0.9 % IV SOLN
INTRAVENOUS | Status: DC
  Administered 2016-04-21: 1000 mL via INTRAVENOUS
  Administered 2016-04-21: 11:00:00 via INTRAVENOUS

## 2016-04-21 MED ORDER — ACETAMINOPHEN 325 MG PO TABS
650.0000 mg | ORAL_TABLET | Freq: Four times a day (QID) | ORAL | Status: DC | PRN
Start: 2016-04-21 — End: 2016-04-24
  Administered 2016-04-24: 650 mg via ORAL
  Filled 2016-04-21 (×2): qty 2

## 2016-04-21 MED ORDER — PHENYLEPHRINE HCL 10 MG/ML IJ SOLN
INTRAMUSCULAR | Status: DC | PRN
  Administered 2016-04-21: 300 ug via INTRAVENOUS
  Administered 2016-04-21 (×4): 200 ug via INTRAVENOUS

## 2016-04-21 MED ORDER — KCL IN DEXTROSE-NACL 20-5-0.9 MEQ/L-%-% IV SOLN
INTRAVENOUS | Status: DC
  Administered 2016-04-21: 19:00:00 via INTRAVENOUS
  Filled 2016-04-21 (×3): qty 1000

## 2016-04-21 MED ORDER — LACTATED RINGERS IV SOLN
INTRAVENOUS | Status: DC | PRN
  Administered 2016-04-21: 15:00:00 via INTRAVENOUS

## 2016-04-21 MED ORDER — ACETAMINOPHEN 500 MG PO TABS
1000.0000 mg | ORAL_TABLET | Freq: Four times a day (QID) | ORAL | Status: AC
Start: 2016-04-21 — End: 2016-04-22
  Administered 2016-04-22 (×2): 1000 mg via ORAL
  Filled 2016-04-21 (×2): qty 2

## 2016-04-21 MED ORDER — ONDANSETRON HCL 4 MG/2ML IJ SOLN: 4.0000 mg | Freq: Four times a day (QID) | INTRAMUSCULAR | Status: DC | PRN

## 2016-04-21 MED ORDER — CLINDAMYCIN PHOSPHATE 600 MG/50ML IV SOLN
INTRAVENOUS | Status: DC | PRN
  Administered 2016-04-21: 600 mg via INTRAVENOUS

## 2016-04-21 MED ORDER — MORPHINE SULFATE (PF) 2 MG/ML IV SOLN
1.0000 mg | Freq: Four times a day (QID) | INTRAVENOUS | Status: DC | PRN
  Administered 2016-04-21: 1 mg via INTRAVENOUS
  Filled 2016-04-21: qty 1

## 2016-04-21 MED ORDER — PROPOFOL 10 MG/ML IV BOLUS: INTRAVENOUS | Status: DC | PRN

## 2016-04-21 MED ORDER — FLEET ENEMA 7-19 GM/118ML RE ENEM: 1.0000 | ENEMA | Freq: Once | RECTAL | Status: DC | PRN

## 2016-04-21 MED ORDER — METOCLOPRAMIDE HCL 5 MG/ML IJ SOLN
5.0000 mg | Freq: Three times a day (TID) | INTRAMUSCULAR | Status: DC | PRN
Start: 2016-04-21 — End: 2016-04-24

## 2016-04-21 MED ORDER — PANTOPRAZOLE SODIUM 40 MG PO TBEC
40.0000 mg | DELAYED_RELEASE_TABLET | Freq: Every day | ORAL | Status: DC
  Administered 2016-04-22 – 2016-04-24 (×2): 40 mg via ORAL
  Filled 2016-04-21 (×3): qty 1

## 2016-04-21 MED ORDER — FENTANYL CITRATE (PF) 100 MCG/2ML IJ SOLN
INTRAMUSCULAR | Status: DC | PRN
  Administered 2016-04-21 (×2): 25 ug via INTRAVENOUS

## 2016-04-21 MED ORDER — ONDANSETRON HCL 4 MG PO TABS: 4.0000 mg | ORAL_TABLET | Freq: Four times a day (QID) | ORAL | Status: DC | PRN

## 2016-04-21 MED ORDER — ACETAMINOPHEN 650 MG RE SUPP
650.0000 mg | Freq: Four times a day (QID) | RECTAL | Status: DC | PRN
Start: 2016-04-21 — End: 2016-04-24

## 2016-04-21 MED ORDER — CLINDAMYCIN PHOSPHATE 600 MG/50ML IV SOLN
600.0000 mg | Freq: Four times a day (QID) | INTRAVENOUS | Status: AC
  Administered 2016-04-21 – 2016-04-22 (×3): 600 mg via INTRAVENOUS
  Filled 2016-04-21 (×3): qty 50

## 2016-04-21 MED ORDER — ENOXAPARIN SODIUM 30 MG/0.3ML ~~LOC~~ SOLN
30.0000 mg | SUBCUTANEOUS | Status: DC
  Administered 2016-04-22 – 2016-04-24 (×3): 30 mg via SUBCUTANEOUS
  Filled 2016-04-21 (×3): qty 0.3

## 2016-04-21 MED ORDER — BUPIVACAINE-EPINEPHRINE (PF) 0.5% -1:200000 IJ SOLN
INTRAMUSCULAR | Status: DC | PRN
  Administered 2016-04-21: 20 mL via PERINEURAL

## 2016-04-21 MED ORDER — CLINDAMYCIN PHOSPHATE 600 MG/50ML IV SOLN
INTRAVENOUS | Status: AC
  Filled 2016-04-21: qty 50

## 2016-04-21 MED ORDER — FENTANYL CITRATE (PF) 100 MCG/2ML IJ SOLN: 25.0000 ug | INTRAMUSCULAR | Status: DC | PRN

## 2016-04-21 MED ORDER — DIPHENHYDRAMINE HCL 12.5 MG/5ML PO ELIX: 12.5000 mg | ORAL_SOLUTION | ORAL | Status: DC | PRN

## 2016-04-21 MED ORDER — NEOMYCIN-POLYMYXIN B GU 40-200000 IR SOLN
Status: AC
  Filled 2016-04-21: qty 2

## 2016-04-21 MED ORDER — NEOMYCIN-POLYMYXIN B GU 40-200000 IR SOLN
Status: DC | PRN
  Administered 2016-04-21: 2 mL

## 2016-04-21 MED ORDER — BUPIVACAINE-EPINEPHRINE (PF) 0.5% -1:200000 IJ SOLN
INTRAMUSCULAR | Status: AC
  Filled 2016-04-21: qty 30

## 2016-04-21 MED ORDER — MAGNESIUM HYDROXIDE 400 MG/5ML PO SUSP: 30.0000 mL | Freq: Every day | ORAL | Status: DC | PRN

## 2016-04-21 MED ORDER — ONDANSETRON HCL 4 MG/2ML IJ SOLN: 4.0000 mg | Freq: Once | INTRAMUSCULAR | Status: DC | PRN

## 2016-04-21 MED ORDER — PROPOFOL 500 MG/50ML IV EMUL
INTRAVENOUS | Status: DC | PRN
  Administered 2016-04-21: 75 ug/kg/min via INTRAVENOUS

## 2016-04-21 MED ORDER — KETAMINE HCL 10 MG/ML IJ SOLN
INTRAMUSCULAR | Status: DC | PRN
  Administered 2016-04-21: 40 mg via INTRAVENOUS
  Administered 2016-04-21: 30 mg via INTRAVENOUS

## 2016-04-21 MED ORDER — METOCLOPRAMIDE HCL 10 MG PO TABS: 5.0000 mg | ORAL_TABLET | Freq: Three times a day (TID) | ORAL | Status: DC | PRN

## 2016-04-21 SURGICAL SUPPLY — 40 items
BIT DRILL 4.3MMS DISTAL GRDTED (BIT) ×1 IMPLANT
BNDG COHESIVE 4X5 TAN STRL (GAUZE/BANDAGES/DRESSINGS) ×3 IMPLANT
BNDG COHESIVE 6X5 TAN STRL LF (GAUZE/BANDAGES/DRESSINGS) ×3 IMPLANT
CANISTER SUCT 1200ML W/VALVE (MISCELLANEOUS) ×3 IMPLANT
CHLORAPREP W/TINT 26ML (MISCELLANEOUS) ×6 IMPLANT
DRAPE C-ARMOR (DRAPES) IMPLANT
DRAPE SHEET LG 3/4 BI-LAMINATE (DRAPES) ×3 IMPLANT
DRILL 4.3MMS DISTAL GRADUATED (BIT) ×3
DRSG OPSITE POSTOP 3X4 (GAUZE/BANDAGES/DRESSINGS) ×3 IMPLANT
DRSG OPSITE POSTOP 4X6 (GAUZE/BANDAGES/DRESSINGS) ×3 IMPLANT
ELECT CAUTERY BLADE 6.4 (BLADE) ×3 IMPLANT
ELECT REM PT RETURN 9FT ADLT (ELECTROSURGICAL) ×3
ELECTRODE REM PT RTRN 9FT ADLT (ELECTROSURGICAL) ×1 IMPLANT
GAUZE SPONGE 4X4 12PLY STRL (GAUZE/BANDAGES/DRESSINGS) IMPLANT
GLOVE BIO SURGEON STRL SZ8 (GLOVE) ×12 IMPLANT
GLOVE INDICATOR 8.0 STRL GRN (GLOVE) ×3 IMPLANT
GOWN STRL REUS W/ TWL LRG LVL3 (GOWN DISPOSABLE) ×1 IMPLANT
GOWN STRL REUS W/ TWL XL LVL3 (GOWN DISPOSABLE) ×1 IMPLANT
GOWN STRL REUS W/TWL LRG LVL3 (GOWN DISPOSABLE) ×2
GOWN STRL REUS W/TWL XL LVL3 (GOWN DISPOSABLE) ×2
GUIDEPIN VERSANAIL DSP 3.2X444 ×3 IMPLANT
GUIDEWIRE BALL NOSE 100CM (WIRE) ×3 IMPLANT
HFN LH 130 DEG 11MM X 380MM (Orthopedic Implant) ×3 IMPLANT
HIP FRAC NAIL LAG SCR 10.5X100 (Orthopedic Implant) ×2 IMPLANT
MAT BLUE FLOOR 46X72 FLO (MISCELLANEOUS) IMPLANT
NEEDLE FILTER BLUNT 18X 1/2SAF (NEEDLE) ×2
NEEDLE FILTER BLUNT 18X1 1/2 (NEEDLE) ×1 IMPLANT
NEEDLE HYPO 22GX1.5 SAFETY (NEEDLE) ×3 IMPLANT
NS IRRIG 500ML POUR BTL (IV SOLUTION) ×3 IMPLANT
PACK HIP COMPR (MISCELLANEOUS) ×3 IMPLANT
SCREW BONE CORTICAL 5.0X44 (Screw) ×3 IMPLANT
SCREW CANN THRD AFF 10.5X100 (Orthopedic Implant) ×1 IMPLANT
SCREWDRIVER HEX TIP 3.5MM (MISCELLANEOUS) ×3 IMPLANT
STAPLER SKIN PROX 35W (STAPLE) ×3 IMPLANT
STRAP SAFETY BODY (MISCELLANEOUS) ×3 IMPLANT
SUT VIC AB 1 CT1 36 (SUTURE) ×3 IMPLANT
SUT VIC AB 2-0 CT1 (SUTURE) ×6 IMPLANT
SYR 30ML LL (SYRINGE) ×3 IMPLANT
SYRINGE 10CC LL (SYRINGE) ×3 IMPLANT
TAPE MICROFOAM 4IN (TAPE) IMPLANT

## 2016-04-21 NOTE — Anesthesia Postprocedure Evaluation (Signed)
Anesthesia Post Note  Patient: Jorge Moore  Procedure(s) Performed: Procedure(s) (LRB): INTRAMEDULLARY (IM) NAIL INTERTROCHANTRIC (Left)  Patient location during evaluation: PACU Anesthesia Type: General Level of consciousness: awake and alert Pain management: pain level controlled Vital Signs Assessment: post-procedure vital signs reviewed and stable Respiratory status: spontaneous breathing, nonlabored ventilation, respiratory function stable and patient connected to nasal cannula oxygen Cardiovascular status: blood pressure returned to baseline and stable Postop Assessment: no signs of nausea or vomiting Anesthetic complications: no    Last Vitals:  Vitals:   04/21/16 1739 04/21/16 1758  BP:  (!) 150/69  Pulse: 84 86  Resp: 12 (!) 22  Temp:  36.6 C    Last Pain:  Vitals:   04/21/16 1758  TempSrc: Oral                 Martha Clan

## 2016-04-21 NOTE — Anesthesia Preprocedure Evaluation (Signed)
Anesthesia Evaluation  Patient identified by MRN, date of birth, ID band Patient awake and Patient confused    Reviewed: Allergy & Precautions, H&P , NPO status , Patient's Chart, lab work & pertinent test results, reviewed documented beta blocker date and time   History of Anesthesia Complications Negative for: history of anesthetic complications  Airway Mallampati: II  TM Distance: >3 FB Neck ROM: full    Dental no notable dental hx. (+) Edentulous Upper, Edentulous Lower   Pulmonary neg pulmonary ROS, former smoker,    Pulmonary exam normal breath sounds clear to auscultation       Cardiovascular Exercise Tolerance: Good (-) hypertension(-) angina(-) CAD, (-) Past MI, (-) Cardiac Stents and (-) CABG negative cardio ROS Normal cardiovascular exam+ dysrhythmias Atrial Fibrillation (-) Valvular Problems/Murmurs Rhythm:regular Rate:Normal     Neuro/Psych neg Seizures PSYCHIATRIC DISORDERS (Depression and dementia) CVA negative neurological ROS  negative psych ROS   GI/Hepatic negative GI ROS, Neg liver ROS, GERD  ,  Endo/Other  negative endocrine ROS  Renal/GU negative Renal ROS  negative genitourinary   Musculoskeletal   Abdominal   Peds  Hematology negative hematology ROS (+)   Anesthesia Other Findings Past Medical History: No date: Ataxia No date: Atrial fibrillation (HCC) No date: Dementia No date: Depressed No date: Femur fracture (HCC) No date: GERD (gastroesophageal reflux disease) No date: PE (pulmonary embolism) No date: Prostate cancer (HCC)   Reproductive/Obstetrics negative OB ROS                             Anesthesia Physical  Anesthesia Plan  ASA: III  Anesthesia Plan: General   Post-op Pain Management:    Induction:   Airway Management Planned:   Additional Equipment:   Intra-op Plan:   Post-operative Plan:   Informed Consent: I have reviewed the  patients History and Physical, chart, labs and discussed the procedure including the risks, benefits and alternatives for the proposed anesthesia with the patient or authorized representative who has indicated his/her understanding and acceptance.   Dental Advisory Given  Plan Discussed with: Anesthesiologist, CRNA and Surgeon  Anesthesia Plan Comments:         Anesthesia Quick Evaluation

## 2016-04-21 NOTE — Op Note (Signed)
04/20/2016 - 04/21/2016  4:32 PM  Patient:   Jorge Moore  Pre-Op Diagnosis:   Two-part intertrochanteric fracture, left hip.  Post-Op Diagnosis:   Same.  Procedure:   Reduction and internal fixation of two-part intertrochanteric left hip fracture with Biomet Affyxis TFN nail.  Surgeon:   Pascal Lux, MD  Assistant:   None  Anesthesia:   IV sedation  Findings:   As above  Complications:   None  EBL:   50 cc  Fluids:   300 cc crystalloid  UOP:   None  TT:   None  Drains:   None  Closure:   Staples  Implants:   Biomet Affyxis 11 x 380 mm TFN with a 100 mm lag screw and a 44 mm distal interlocking screw  Brief Clinical Note:   The patient is a 82 year old frail and demented male who lives in a nursing home. Apparently he tried to get out of his wheelchair and walk, but fell, landing on his left hip. He was brought to the emergency room where x-rays demonstrated an intertrochanteric fracture of the left hip. The patient has been cleared medically and presents at this time for reduction and internal fixation of the left hip fracture.  Procedure:   The patient was brought into the operating room. After adequate IV sedation was achieved, the patient was lain in the supine position on the fracture table. The uninjured leg was placed in a flexed and abducted position while the injured lower extremity was placed in longitudinal traction. The fracture was reduced using longitudinal traction and internal rotation. The adequacy of reduction was verified fluoroscopically in AP and lateral projections and found to be near anatomic. The lateral aspects of the left hip and thigh were prepped with ChloraPrep solution before being draped sterilely. Preoperative antibiotics were administered. The greater trochanter was identified fluoroscopically and an approximately 3 cm incision made about 2-3 fingerbreadths above the tip of the greater trochanter. The incision was carried down through the  subcutaneous tissues to expose the gluteal fascia. This was split the length of the incision, providing access to the tip of the trochanter. Under fluoroscopic guidance, a guidewire was drilled through the tip of the trochanter into the proximal metaphysis to the level of the lesser trochanter. After verifying its position fluoroscopically in AP and lateral projections, it was overreamed with the initial reamer to the depth of the lesser trochanter. A guidewire was passed down through the femoral canal to the supracondylar region. The adequacy of guidewire position was verified fluoroscopically in AP and lateral projections before the length of the guidewire within the canal was measured and found to be 380 mm. It was overreamed sequentially using the flexible reamers, beginning with a 9 mm reamer and progressing to a 12.5 mm reamer. This provided good cortical chatter. The 11 x 380 mm Biomet Affyxis TFN rod was selected and advanced to the appropriate depth, as verified fluoroscopically. The guide system for the lag screw was positioned and advanced through an approximately 2 cm stab incision over the lateral aspect of the proximal femur. The guidewire was drilled up through the trochanteric femoral nail and into the femoral neck to rest within 5 mm of subchondral bone. After verifying its position in the femoral neck and head in both AP and lateral projections, the guidewire was measured and found to be optimally replicated by a 123XX123 mm lag screw. The guidewire was overreamed to the appropriate depth before the lag screw was inserted and  advanced to the appropriate depth as verified fluoroscopically in AP and lateral projections. The locking screw was advanced, then backed off a quarter turn to set the lag screw. Again the adequacy of hardware position and fracture reduction was verified fluoroscopically in AP and lateral projections and found to be excellent.  Attention was directed distally. Using the "perfect  circle" technique, the leg and fluoroscopy machine were positioned appropriately. An approximate 1.5 cm stab incision was made over the skin at the appropriate point before the drill bit was advanced through the cortex and across the static hole of the nail. The appropriate length of the screw was determined before the 44 mm distal interlocking screw was positioned, then advanced and tightened securely. Again the adequacy of screw position was verified fluoroscopically in AP and lateral projections and found to be excellent.  The wounds were irrigated thoroughly with sterile saline solution before the deeper subcutaneous tissues were closed using 2-0 Vicryl interrupted sutures. The skin was closed using staples. Sterile occlusive dressings were applied to all wounds before the patient was transferred back to his hospital bed. The patient was then transferred to the recovery room in satisfactory condition after tolerating the procedure well.

## 2016-04-21 NOTE — Progress Notes (Signed)
Patient returned from surgery. Unable to give oral meds at this time d/t sedation.

## 2016-04-21 NOTE — Progress Notes (Signed)
Morphine dropped off of patient's MAR for some reason. Paged and spoke to Dr. Vianne Bulls who ordered Morphine 1 mg q 6 PRN.

## 2016-04-21 NOTE — Progress Notes (Signed)
[facility] NAME: Jorge Moore    MR#:  [id number]  DATE OF BIRTH:  1924-07-02  SUBJECTIVE:  CHIEF COMPLAINT:   Chief Complaint  Patient presents with  . Hip Pain  Patient is 82 year old Caucasian male with past medical history significant for history of dementia, bedridden and wheelchair bound, on hospice, who presents to the hospital after fall and was found to have left hip fracture. The patient, a patient had a right hip fracture in February 2017, which was fixed by Dr. Marry Guan. Apparently patient had 3 visits to emergency room due to falls in the past one week. On arrival to the hospital patient was drowsy and confused. Patient's family admitted of weight loss over the past 6 months, despite good appetite by mouth.. The patient is unable to review systems due to dementia   Review of Systems  Unable to perform Moore: Dementia    VITAL SIGNS: Blood pressure (!) 116/55, pulse 81, temperature 97.5 F (36.4 C), temperature source Oral, resp. rate 18, height 5\' 8"  (1.727 m), weight 63.5 kg (140 lb), SpO2 (!) 81 %.  PHYSICAL EXAMINATION:   GENERAL:  82 y.o.-year-old patient lying in the bed with no acute distress.  EYES: Pupils equal, round, reactive to light and accommodation. No scleral icterus. Extraocular muscles intact.  HEENT: Head atraumatic, normocephalic. Oropharynx and nasopharynx clear.  NECK:  Supple, no jugular venous distention. No thyroid enlargement, no tenderness.  LUNGS: Normal breath sounds bilaterally, no wheezing, rales,rhonchi or crepitation. No use of accessory muscles of respiration.  CARDIOVASCULAR: S1, S2 normal. No murmurs, rubs, or gallops.  ABDOMEN: Soft, nontender, nondistended. Bowel sounds present. No organomegaly or mass.  EXTREMITIES: No pedal edema, cyanosis, or clubbing.  NEUROLOGIC: Cranial nerves II through XII are intact. Muscle strength 5/5 in all extremities. Sensation intact. Gait not  checked.  PSYCHIATRIC: The pa[place]on, disoriented, confused, restless in bed SKIN: No obvious rash, lesion, or ulcer.   ORDERS/RESULTS REVIEWED:   CBC  Recent Labs Lab 04/16/16 1653 04/20/16 1519 04/21/16 0355  WBC 9.5 9.3 9.7  HGB 12.6* 11.6* 11.7*  HCT 35.3* 33.7* 33.7*  PLT 194 168 162  MCV 95.9 97.5 98.4  MCH 34.2* 33.6 34.2*  MCHC 35.7 34.5 34.7  RDW 13.2 12.8 13.0  LYMPHSABS  --  2.0  --   MONOABS  --  1.1*  --   EOSABS  --  0.0  --   BASOSABS  --  0.0  --    ------------------------------------------------------------------------------------------------------------------  Chemistries   Recent Labs Lab 04/16/16 1653 04/20/16 1519 04/21/16 0355  NA 138 142 141  K 4.0 4.0 3.8  CL 106 102 104  CO2 23 33* 33*  GLUCOSE 98 120* 114*  BUN 22* 14 13  CREATININE 1.09 0.88 0.82  CALCIUM 9.3 9.5 9.1   ------------------------------------------------------------------------------------------------------------------ estimated creatinine clearance is 53.8 mL/min (by C-G formula based on SCr of 0.82 mg/dL). ------------------------------------------------------------------------------------------------------------------ No results for input(s): TSH, T4TOTAL, T3FREE, THYROIDAB in the last 72 hours.  Invalid input(s): FREET3  Cardiac Enzymes  Recent Labs Lab 04/16/16 1653  TROPONINI <0.03   ------------------------------------------------------------------------------------------------------------------ Invalid input(s): POCBNP ---------------------------------------------------------------------------------------------------------------  RADIOLOGY: Ct Head Wo Contrast  Result Date: 04/20/2016 CLINICAL DATA:  Fall. EXAM: CT HEAD WITHOUT CONTRAST TECHNIQUE: Contiguous axial images were obtained from the base of the skull through the vertex without intravenous contrast. COMPARISON:  None. 04/16/2016 FINDINGS: Brain: There is mild diffuse low-attenuation within the  subcortical and periventricular white matter compatible with  chronic microvascular disease. Chronic right basal ganglia lacunar infarct is unchanged. Prominence of the sulci and ventricles are identified compatible with brain atrophy. Encephalomalacia involving the left cerebellar hemisphere is again noted, unchanged. No evidence of acute infarction, hemorrhage, extra-axial collection,or mass effect. Vascular: No hyperdense vessel or unexpected calcification. Skull: Negative for fracture or focal lesion. Sinuses/Orbits: No acute findings. Other: None. IMPRESSION: 1. No acute intracranial abnormalities. No significant change from 04/16/2016. 2. Chronic microvascular disease and brain atrophy. Electronically Signed   By: Kerby Moors M.D.   On: 04/20/2016 18:18   Dg Hip Unilat With Pelvis 2-3 Views Left  Result Date: 04/20/2016 CLINICAL DATA:  Known LEFT hip fracture, prior RIGHT hip surgery in March, acute LEFT hip pain EXAM: DG HIP (WITH OR WITHOUT PELVIS) 2-3V LEFT COMPARISON:  11/29/2015 FINDINGS: Marked osseous demineralization. Orthopedic hardware proximal RIGHT femur post ORIF. Intertrochanteric fracture proximal LEFT femur new since previous exam with varus angulation and mild displacement. Mild degenerative changes of LEFT hip joint without dislocation. Pelvis appears intact. Surgical clips in pelvis bilaterally. IMPRESSION: Displaced and angulated intertrochanteric fracture LEFT femur. Osseous demineralization with mild degenerative changes LEFT hip joint. Electronically Signed   By: Lavonia Dana M.D.   On: 04/20/2016 16:57    EKG:  Orders placed or performed during the hospital encounter of 04/16/16  . EKG 12-Lead  . EKG 12-Lead  . ED EKG  . ED EKG  . EKG    ASSESSMENT AND PLAN:  Active Problems:   Closed left hip fracture (HCC) #1 displaced and angulated intratrochanteric fracture of left femur ,  initial care, patient was consulted by Dr. Jerrye Noble, orthopedist surgeon, who is going to  discuss with patient's family and possibly take patient to operating room today, continue pain medications, physical therapy after procedure versus discharge to facility with hospice care and pain management   #2. Dementia without behavioral disturbance, supportive therapy   #3 . Anemia, stable, follow perioperatively #4. Hyperglycemia, get hemoglobin A1c to rule out diabetes, likely stress related   Management plans discussed with the patient, family and they are in agreement.   DRUG ALLERGIES:  Allergies  Allergen Reactions  . Penicillins Other (See Comments)    Reaction:  Unknown     CODE STATUS:     Code Status Orders        Start     Ordered   04/20/16 1754  Do not attempt resuscitation (DNR)  Continuous    Question Answer Comment  In the event of cardiac or respiratory ARREST Do not call a "code blue"   In the event of cardiac or respiratory ARREST Do not perform Intubation, CPR, defibrillation or ACLS   In the event of cardiac or respiratory ARREST Use medication by any route, position, wound care, and other measures to relive pain and suffering. May use oxygen, suction and manual treatment of airway obstruction as needed for comfort.      04/20/16 1754    Code Status History    Date Active Date Inactive Code Status Order ID Comments User Context   11/30/2015  6:43 AM 12/01/2015  8:00 PM Full Code [id number]  Harrie Foreman, MD Inpatient   11/03/2015 10:22 PM 11/04/2015  1:17 PM Full Code [id number]  Vaughan Basta, MD Inpatient   11/03/2015 10:16 PM 11/03/2015 10:22 PM Full Code [id number]  Dereck Leep, MD ED      TOTAL TIME TAKING CARE OF THIS PATIEN102minutes.   discussed with Dr. Nicholaus Bloom PA   Theodoro Grist  M.D on 04/21/2016 at 12:12 PM  Between 7am to 6pm - Pager - [phone]  After 6pm go to [email or web address] - password [email or web address] [facility]  [facility]  Office  [phone]  CC: Primary care physician; Leonel Ramsay, MD

## 2016-04-21 NOTE — Transfer of Care (Signed)
Immediate Anesthesia Transfer of Care Note  Patient: Jorge Moore  Procedure(s) Performed: Procedure(s): INTRAMEDULLARY (IM) NAIL INTERTROCHANTRIC (Left)  Patient Location: PACU  Anesthesia Type:General  Level of Consciousness: sedated  Airway & Oxygen Therapy: Patient Spontanous Breathing and Patient connected to face mask oxygen  Post-op Assessment: Report given to RN and Post -op Vital signs reviewed and stable  Post vital signs: Reviewed and stable  Last Vitals:  Vitals:   04/21/16 0729 04/21/16 1231  BP: (!) 116/55 (!) 128/53  Pulse: 81 (!) 121  Resp: 18 16  Temp: 36.4 C 36.8 C    Last Pain:  Vitals:   04/21/16 1231  TempSrc: Oral         Complications: No apparent anesthesia complications

## 2016-04-22 LAB — CBC WITH DIFFERENTIAL/PLATELET
BASOS ABS: 0.1 10*3/uL (ref 0–0.1)
BASOS PCT: 1 %
EOS PCT: 1 %
Eosinophils Absolute: 0.1 10*3/uL (ref 0–0.7)
HCT: 31.2 % — ABNORMAL LOW (ref 40.0–52.0)
Hemoglobin: 11 g/dL — ABNORMAL LOW (ref 13.0–18.0)
Lymphocytes Relative: 9 %
Lymphs Abs: 0.9 10*3/uL — ABNORMAL LOW (ref 1.0–3.6)
MCH: 34.8 pg — ABNORMAL HIGH (ref 26.0–34.0)
MCHC: 35.4 g/dL (ref 32.0–36.0)
MCV: 98.2 fL (ref 80.0–100.0)
MONO ABS: 0.9 10*3/uL (ref 0.2–1.0)
Monocytes Relative: 10 %
Neutro Abs: 7.7 10*3/uL — ABNORMAL HIGH (ref 1.4–6.5)
Neutrophils Relative %: 79 %
Platelets: 138 10*3/uL — ABNORMAL LOW (ref 150–440)
RBC: 3.17 MIL/uL — AB (ref 4.40–5.90)
RDW: 13.2 % (ref 11.5–14.5)
WBC: 9.7 10*3/uL (ref 3.8–10.6)

## 2016-04-22 LAB — BASIC METABOLIC PANEL
ANION GAP: 6 (ref 5–15)
BUN: 12 mg/dL (ref 6–20)
CALCIUM: 8.8 mg/dL — AB (ref 8.9–10.3)
CO2: 30 mmol/L (ref 22–32)
Chloride: 105 mmol/L (ref 101–111)
Creatinine, Ser: 0.78 mg/dL (ref 0.61–1.24)
GFR calc Af Amer: >60 mL/min (ref >60)
GLUCOSE: 131 mg/dL — AB (ref 65–99)
Potassium: 4.2 mmol/L (ref 3.5–5.1)
SODIUM: 141 mmol/L (ref 135–145)

## 2016-04-22 LAB — HEMOGLOBIN: HEMOGLOBIN: 10.7 g/dL — AB (ref 13.0–18.0)

## 2016-04-22 MED ORDER — ALBUTEROL SULFATE (2.5 MG/3ML) 0.083% IN NEBU: 2.5000 mg | INHALATION_SOLUTION | Freq: Four times a day (QID) | RESPIRATORY_TRACT | Status: DC | PRN

## 2016-04-22 MED ORDER — NYSTATIN 100000 UNIT/ML MT SUSP
5.0000 mL | Freq: Four times a day (QID) | OROMUCOSAL | Status: DC | PRN
  Administered 2016-04-22: 500000 [IU] via OROMUCOSAL
  Filled 2016-04-22: qty 5

## 2016-04-22 MED ORDER — SODIUM CHLORIDE 0.9 % IV SOLN
INTRAVENOUS | Status: AC
  Administered 2016-04-22 (×2): via INTRAVENOUS

## 2016-04-22 MED ORDER — SODIUM CHLORIDE 0.9 % IV BOLUS (SEPSIS)
250.0000 mL | Freq: Once | INTRAVENOUS | Status: AC
  Administered 2016-04-22: 250 mL via INTRAVENOUS

## 2016-04-22 MED ORDER — ALBUTEROL SULFATE (2.5 MG/3ML) 0.083% IN NEBU
2.5000 mg | INHALATION_SOLUTION | RESPIRATORY_TRACT | Status: DC
  Administered 2016-04-22 (×3): 2.5 mg via RESPIRATORY_TRACT
  Filled 2016-04-22 (×3): qty 3

## 2016-04-22 MED ORDER — NYSTATIN 100000 UNIT/ML MT SUSP: 5.0000 mL | Freq: Four times a day (QID) | OROMUCOSAL | Status: DC

## 2016-04-22 MED ORDER — ALBUTEROL SULFATE (2.5 MG/3ML) 0.083% IN NEBU
2.5000 mg | INHALATION_SOLUTION | Freq: Three times a day (TID) | RESPIRATORY_TRACT | Status: DC
  Administered 2016-04-23: 2.5 mg via RESPIRATORY_TRACT
  Filled 2016-04-22: qty 3

## 2016-04-22 NOTE — Progress Notes (Signed)
  Subjective: 1 Day Post-Op Procedure(s) (LRB): INTRAMEDULLARY (IM) NAIL INTERTROCHANTRIC (Left) Patient reports pain as mild.   Patient is well, but has had some minor complaints of hypotension Plan is to go Skilled nursing facility after hospital stay. Negative for chest pain and shortness of breath Fever: no Gastrointestinal:Negative for nausea and vomiting  Objective: Vital signs in last 24 hours: Temp:  [97.6 F (36.4 C)-98.9 F (37.2 C)] 98.3 F (36.8 C) (08/06 0737) Pulse Rate:  [32-121] 81 (08/06 0737) Resp:  [5-22] 18 (08/06 0737) BP: (88-155)/(47-93) 88/47 (08/06 0737) SpO2:  [89 %-100 %] 99 % (08/06 0737) Weight:  [53.7 kg (118 lb 6 oz)] 53.7 kg (118 lb 6 oz) (08/06 0500)  Intake/Output from previous day:  Intake/Output Summary (Last 24 hours) at 04/22/16 0832 Last data filed at 04/22/16 0525  Gross per 24 hour  Intake             1245 ml  Output               50 ml  Net             1195 ml    Intake/Output this shift: No intake/output data recorded.  Labs:  Recent Labs  04/20/16 1519 04/21/16 0355 04/22/16 0444  HGB 11.6* 11.7* 11.0*    Recent Labs  04/21/16 0355 04/22/16 0444  WBC 9.7 9.7  RBC 3.43* 3.17*  HCT 33.7* 31.2*  PLT 162 138*    Recent Labs  04/21/16 0355 04/22/16 0444  NA 141 141  K 3.8 4.2  CL 104 105  CO2 33* 30  BUN 13 12  CREATININE 0.82 0.78  GLUCOSE 114* 131*  CALCIUM 9.1 8.8*   No results for input(s): LABPT, INR in the last 72 hours.   EXAM General - Patient is Disorganized, Lacking and drowsy this AM but answers questions appropriately pertaining to his left hip Extremity - ABD soft Sensation intact distally Intact pulses distally Dorsiflexion/Plantar flexion intact Incision: dressing C/D/I No cellulitis present Dressing/Incision - clean, dry, no drainage Motor Function - intact, moving foot and toes well on exam.   Negative Homan's to bilateral lower extremities.  Past Medical History:  Diagnosis  Date  . Ataxia   . Atrial fibrillation ([place])   . Dementia   . Depressed   . Femur fracture ([place])   . GERD (gastroesophageal reflux disease)   . PE (pulmonary embolism)   . Prostate cancer ([place])     Assessment/Plan: 1 Day Post-Op Procedure(s) (LRB): INTRAMEDULLARY (IM) NAIL INTERTROCHANTRIC (Left) Active Problems:   Closed left hip fracture (HCC)  Estimated body mass index is 18 kg/m as calculated from the following:   Height as of this encounter: 5\' 8"  (1.727 m).   Weight as of this encounter: 53.7 kg (118 lb 6 oz). Advance diet Up with therapy  Labs reviewed, Hg stable at 11.0.  CBC and BMP ordered for tomorrow. BP 88/47, HR 81 this AM.  Pt receiving IV bolus and remaining on IVF Pt has not had a BM. Pt is wheelchair bound on hospice care. Plan will be for discharge to SNF or hospice when appropriate.  DVT Prophylaxis - Lovenox, Foot Pumps and TED hose Weight-Bearing as tolerated to left leg  J. Cameron Proud, PA-C [facility] 04/22/2016, 8:32 AM

## 2016-04-22 NOTE — Progress Notes (Signed)
PT Cancellation Note  Patient Details Name: Jorge Moore MRN: [id number] DOB: 07-Mar-1924   Cancelled Treatment:    Reason Eval/Treat Not Completed: Patient's level of consciousness Pt is sleeping in bed and though he briefly opened his eyes with multiple attempts of waking him (calling his name, sternal rub, hand squeeze) but pt did not wake.  Apparently pt is bed/wheelchair bound at baseline with dementia.  Unsure how much he will be able to participate with PT.   Kreg Shropshire 04/22/2016, 2:29 PM

## 2016-04-22 NOTE — Progress Notes (Signed)
[facility] [place] NAME: Jorge Moore    MR#:  [id number]  DATE OF BIRTH:  05-Jun-1924  SUBJECTIVE:  CHIEF COMPLAINT:   Chief Complaint  Patient presents with  . Hip Pain  Patient is 82 year old Caucasian male with past medical history significant for history of dementia, bedridden and wheelchair bound, on hospice, who presents to the hospital after fall and was found to have left hip fracture. The patient, a patient had a right hip fracture in February 2017, which was fixed by Dr. Marry Guan. Apparently patient had 3 visits to emergency room due to falls in the past one week. On arrival to the hospital patient was drowsy and confused. Patient's family admitted of weight loss over the past 6 months, despite good appetite by mouth.. The patient is unable to review systems due to dementia Patient underwent ORIF of intratrochanteric left hip fracture with TNF nail on 04/21/2016 by Dr. Roland Rack. The patient did not require significant amount of pain medications overnight, per nursing staff. He is sleepy, not able to provide review of systems. Hypotensive today with systolic blood pressure in 90s  Review of Systems  Unable to perform ROS: Dementia    VITAL SIGNS: Blood pressure (!) 90/45, pulse 78, temperature 98.3 F (36.8 C), temperature source Oral, resp. rate 18, height 5\' 8"  (1.727 m), weight 53.7 kg (118 lb 6 oz), SpO2 99 %.  PHYSICAL EXAMINATION:   GENERAL:  82 y.o.-year-old patient lying in the bed in mild respiratory distress, breathing through open-mouth, tachypneic.  EYES: Pupils equal, round, reactive to light and accommodation. No scleral icterus. Extraocular muscles intact.  HEENT: Head atraumatic, normocephalic. Oropharynx and nasopharynx clear.  NECK:  Supple, no jugular venous distention. No thyroid enlargement, no tenderness.  LUNGS: Diminished breath sounds bilaterally, scattered wheezing, rales,rhonchi , no crepitations.  Intermittent use of accessory muscles of respiration, breathing through mouth, and some mild respiratory distress.  CARDIOVASCULAR: S1, S2 normal. No murmurs, rubs, or gallops.  ABDOMEN: Soft, nontender, nondistended. Bowel sounds present. No organomegaly or mass.  EXTREMITIES: No pedal edema, cyanosis, or clubbing.  NEUROLOGIC: Cranial nerves II through XII are intact. Muscle strength 5/5 in all extremities. Sensation intact. Gait not checked.  PSYCHIATRIC: The [place], disoriented, confused, restless in bed SKIN: No obvious rash, lesion, or ulcer.   ORDERS/RESULTS REVIEWED:   CBC  Recent Labs Lab 04/16/16 1653 04/20/16 1519 04/21/16 0355 04/22/16 0444  WBC 9.5 9.3 9.7 9.7  HGB 12.6* 11.6* 11.7* 11.0*  HCT 35.3* 33.7* 33.7* 31.2*  PLT 194 168 162 138*  MCV 95.9 97.5 98.4 98.2  MCH 34.2* 33.6 34.2* 34.8*  MCHC 35.7 34.5 34.7 35.4  RDW 13.2 12.8 13.0 13.2  LYMPHSABS  --  2.0  --  0.9*  MONOABS  --  1.1*  --  0.9  EOSABS  --  0.0  --  0.1  BASOSABS  --  0.0  --  0.1   ------------------------------------------------------------------------------------------------------------------  Chemistries   Recent Labs Lab 04/16/16 1653 04/20/16 1519 04/21/16 0355 04/22/16 0444  NA 138 142 141 141  K 4.0 4.0 3.8 4.2  CL 106 102 104 105  CO2 23 33* 33* 30  GLUCOSE 98 120* 114* 131*  BUN 22* 14 13 12   CREATININE 1.09 0.88 0.82 0.78  CALCIUM 9.3 9.5 9.1 8.8*   ------------------------------------------------------------------------------------------------------------------ estimated creatinine clearance is 46.6 mL/min (by C-G formula based on SCr of 0.8 mg/dL). ------------------------------------------------------------------------------------------------------------------ No results for input(s): TSH, T4TOTAL, T3FREE, THYROIDAB in  the last 72 hours.  Invalid input(s): FREET3  Cardiac Enzymes  Recent Labs Lab 04/16/16 1653  TROPONINI <0.03    ------------------------------------------------------------------------------------------------------------------ Invalid input(s): POCBNP ---------------------------------------------------------------------------------------------------------------  RADIOLOGY: Ct Head Wo Contrast  Result Date: 04/20/2016 CLINICAL DATA:  Fall. EXAM: CT HEAD WITHOUT CONTRAST TECHNIQUE: Contiguous axial images were obtained from the base of the skull through the vertex without intravenous contrast. COMPARISON:  None. 04/16/2016 FINDINGS: Brain: There is mild diffuse low-attenuation within the subcortical and periventricular white matter compatible with chronic microvascular disease. Chronic right basal ganglia lacunar infarct is unchanged. Prominence of the sulci and ventricles are identified compatible with brain atrophy. Encephalomalacia involving the left cerebellar hemisphere is again noted, unchanged. No evidence of acute infarction, hemorrhage, extra-axial collection,or mass effect. Vascular: No hyperdense vessel or unexpected calcification. Skull: Negative for fracture or focal lesion. Sinuses/Orbits: No acute findings. Other: None. IMPRESSION: 1. No acute intracranial abnormalities. No significant change from 04/16/2016. 2. Chronic microvascular disease and brain atrophy. Electronically Signed   By: Kerby Moors M.D.   On: 04/20/2016 18:18   Dg Hip Operative Unilat With Pelvis Left  Result Date: 04/21/2016 CLINICAL DATA:  Left IM nail in LRT, status post left hip fracture. EXAM: OPERATIVE LEFT HIP (WITH PELVIS IF PERFORMED)  VIEWS TECHNIQUE: Fluoroscopic spot image(s) were submitted for interpretation post-operatively. COMPARISON:  None. FINDINGS: Intraoperative film showing intra medullary fixation of the left femoral neck fracture. Osseous alignment is significantly improved. Hardware appears intact and appropriately positioned. Fluoroscopy provided for 1 minute and 0 seconds. IMPRESSION: Intraoperative film  showing intra medullary fixation of the left femoral neck fracture. No evidence of surgical complicating feature. Electronically Signed   By: Franki Cabot M.D.   On: 04/21/2016 17:54   Dg Hip Unilat With Pelvis 2-3 Views Left  Result Date: 04/20/2016 CLINICAL DATA:  Known LEFT hip fracture, prior RIGHT hip surgery in March, acute LEFT hip pain EXAM: DG HIP (WITH OR WITHOUT PELVIS) 2-3V LEFT COMPARISON:  11/29/2015 FINDINGS: Marked osseous demineralization. Orthopedic hardware proximal RIGHT femur post ORIF. Intertrochanteric fracture proximal LEFT femur new since previous exam with varus angulation and mild displacement. Mild degenerative changes of LEFT hip joint without dislocation. Pelvis appears intact. Surgical clips in pelvis bilaterally. IMPRESSION: Displaced and angulated intertrochanteric fracture LEFT femur. Osseous demineralization with mild degenerative changes LEFT hip joint. Electronically Signed   By: Lavonia Dana M.D.   On: 04/20/2016 16:57    EKG:  Orders placed or performed during the hospital encounter of 04/16/16  . EKG 12-Lead  . EKG 12-Lead  . ED EKG  . ED EKG  . EKG    ASSESSMENT AND PLAN:  Active Problems:   Closed left hip fracture (HCC) #1 displaced and angulated intratrochanteric fracture of left femur , status post ORIF August 5th, 2017 by Dr. Roland Rack,  continue pain medications, physical therapy after procedure versus discharge to facility with hospice care and pain management on Monday, we will discussed with care management/social work tomorrow  #2. Dementia without behavioral disturbance, supportive therapy   #3 . Anemia, stable, follow perioperatively #4. Hyperglycemia, hemoglobin A1c is 5.5, no diabetes, likely stress related #5 hypotension, advance IV fluids, follow hemoglobin level and transfuse patient as needed   Management plans discussed with the patient, family and they are in agreement.   DRUG ALLERGIES:  Allergies  Allergen Reactions  .  Penicillins Other (See Comments)    Reaction:  Unknown     CODE STATUS:     Code Status Orders  Start     Ordered   04/20/16 1754  Do not attempt resuscitation (DNR)  Continuous    Question Answer Comment  In the event of cardiac or respiratory ARREST Do not call a "code blue"   In the event of cardiac or respiratory ARREST Do not perform Intubation, CPR, defibrillation or ACLS   In the event of cardiac or respiratory ARREST Use medication by any route, position, wound care, and other measures to relive pain and suffering. May use oxygen, suction and manual treatment of airway obstruction as needed for comfort.      04/20/16 1754    Code Status History    Date Active Date Inactive Code Status Order ID Comments User Context   11/30/2015  6:43 AM 12/01/2015  8:00 PM Full Code [id number]  Harrie Foreman, MD Inpatient   11/03/2015 10:22 PM 11/04/2015  1:17 PM Full Code [id number]  Vaughan Basta, MD Inpatient   11/03/2015 10:16 PM 11/03/2015 10:22 PM Full Code [id number]  Dereck Leep, MD ED      TOTAL TIME TAKING CARE OF THIS PATIENT 35 minutes.     Theodoro Grist M.D on 04/22/2016 at 10:46 AM  Between 7am to 6pm - Pager - [phone]  After 6pm go to [email or web address] - password [email or web address] [facility]  [facility]  Office  [phone]  CC: Primary care physician; Leonel Ramsay, MD

## 2016-04-22 NOTE — Progress Notes (Signed)
Called Dr. Ether Griffins regarding white patched on patient's tongue and mouth. Ordered Nystatin 4x day as needed. She said to use sponge swab to wipe out mouth since patient will be unable to swish and swallow.

## 2016-04-22 NOTE — Clinical Social Work Note (Signed)
CSW following pending PT recommendations.  [place] MSW, [place]

## 2016-04-22 NOTE — Progress Notes (Signed)
Patient lethargic today despite no pain meds until 1600 when he began to c/o pain.  Patient did wake up for dinner but he did not feel like eating very much.

## 2016-04-23 ENCOUNTER — Encounter: Payer: Self-pay | Admitting: Surgery

## 2016-04-23 DIAGNOSIS — I959 Hypotension, unspecified: Secondary | ICD-10-CM

## 2016-04-23 DIAGNOSIS — R739 Hyperglycemia, unspecified: Secondary | ICD-10-CM

## 2016-04-23 DIAGNOSIS — Z8781 Personal history of (healed) traumatic fracture: Secondary | ICD-10-CM

## 2016-04-23 DIAGNOSIS — F0390 Unspecified dementia without behavioral disturbance: Secondary | ICD-10-CM

## 2016-04-23 DIAGNOSIS — S72142A Displaced intertrochanteric fracture of left femur, initial encounter for closed fracture: Secondary | ICD-10-CM

## 2016-04-23 DIAGNOSIS — D649 Anemia, unspecified: Secondary | ICD-10-CM

## 2016-04-23 DIAGNOSIS — Z9889 Other specified postprocedural states: Secondary | ICD-10-CM

## 2016-04-23 LAB — BASIC METABOLIC PANEL
ANION GAP: 5 (ref 5–15)
BUN: 13 mg/dL (ref 6–20)
CALCIUM: 8.5 mg/dL — AB (ref 8.9–10.3)
CO2: 28 mmol/L (ref 22–32)
Chloride: 108 mmol/L (ref 101–111)
Creatinine, Ser: 0.72 mg/dL (ref 0.61–1.24)
GFR calc non Af Amer: >60 mL/min (ref >60)
GLUCOSE: 104 mg/dL — AB (ref 65–99)
POTASSIUM: 3.6 mmol/L (ref 3.5–5.1)
Sodium: 141 mmol/L (ref 135–145)

## 2016-04-23 LAB — CBC
HEMATOCRIT: 29.1 % — AB (ref 40.0–52.0)
HEMOGLOBIN: 10.1 g/dL — AB (ref 13.0–18.0)
MCH: 34.1 pg — AB (ref 26.0–34.0)
MCHC: 34.8 g/dL (ref 32.0–36.0)
MCV: 97.9 fL (ref 80.0–100.0)
Platelets: 150 10*3/uL (ref 150–440)
RBC: 2.98 MIL/uL — AB (ref 4.40–5.90)
RDW: 12.9 % (ref 11.5–14.5)
WBC: 9.5 10*3/uL (ref 3.8–10.6)

## 2016-04-23 MED ORDER — ALBUTEROL SULFATE (2.5 MG/3ML) 0.083% IN NEBU: 2.5000 mg | INHALATION_SOLUTION | Freq: Four times a day (QID) | RESPIRATORY_TRACT | 12 refills | Status: AC | PRN

## 2016-04-23 MED ORDER — DOCUSATE SODIUM 100 MG PO CAPS
100.0000 mg | ORAL_CAPSULE | Freq: Two times a day (BID) | ORAL | 0 refills | Status: AC
Start: 2016-04-23 — End: ?

## 2016-04-23 MED ORDER — POLYETHYLENE GLYCOL 3350 17 G PO PACK: 17.0000 g | PACK | Freq: Every day | ORAL | 0 refills | Status: AC | PRN

## 2016-04-23 MED ORDER — MORPHINE SULFATE (CONCENTRATE) 20 MG/ML PO SOLN: 5.0000 mg | ORAL | 0 refills | Status: AC | PRN

## 2016-04-23 MED ORDER — OXYCODONE HCL 5 MG PO TABS: 5.0000 mg | ORAL_TABLET | ORAL | 0 refills | Status: AC | PRN

## 2016-04-23 MED ORDER — NYSTATIN 100000 UNIT/ML MT SUSP: 5.0000 mL | Freq: Four times a day (QID) | OROMUCOSAL | 0 refills | Status: AC | PRN

## 2016-04-23 MED ORDER — ENOXAPARIN SODIUM 30 MG/0.3ML ~~LOC~~ SOLN: 30.0000 mg | SUBCUTANEOUS | 0 refills | Status: DC

## 2016-04-23 MED ORDER — KCL IN DEXTROSE-NACL 20-5-0.45 MEQ/L-%-% IV SOLN
INTRAVENOUS | Status: DC
  Administered 2016-04-23 – 2016-04-24 (×2): via INTRAVENOUS
  Filled 2016-04-23 (×5): qty 1000

## 2016-04-23 MED ORDER — MELATONIN 3 MG PO TABS: 1.0000 | ORAL_TABLET | Freq: Every evening | ORAL | 0 refills | Status: AC | PRN

## 2016-04-23 MED ORDER — TRAMADOL HCL 50 MG PO TABS: 50.0000 mg | ORAL_TABLET | Freq: Three times a day (TID) | ORAL | 0 refills | Status: AC

## 2016-04-23 NOTE — Evaluation (Signed)
Physical Therapy Evaluation Patient Details Name: Jorge Moore MRN: [id number] DOB: 06-May-1924 Today's Date: 04/23/2016   History of Present Illness  Pt is a 82 yr old male presenting from [facility] following a fall from his w/c. Pt was found to have a L intertrochanteric fx and is s/p IM Nailing 8/5. PMH significant for multiple falls, dementia, ataxia, AFib, GERD, prostate CA, and h/o R hip IMN 2/17.   Clinical Impression  Pt is a poor historian and no family present at time of evaluation, thus PLOF difficult to ascertain.  Per chart review, pt is w/c level at baseline, potentially performing transfers mod I from bed <> chair.  Evaluation is limited by pt lethargy, pt going in and out of sleep requiring strong sternal rub for arousal.  Pt was able to participate in supine TherEx as detailed below with frequent cueing for participation.  Pt does demonstrate ability to logroll R with min A at L hip to achieve sidelying (minimal vc's required- pt familiar with technique).  Further OOB assessment was deferred d/t decreased level of alertness.  Pt presents with generalized weakness, lethargy, and decreased independence with functional mobility and would benefit from skilled PT to address noted impairments and functional limitations.  Recommend pt discharge back to ALF with 24/7 care and HHPT when medically appropriate.     Follow Up Recommendations Supervision/Assistance - 24 hour;Home health PT    Equipment Recommendations   (will further assess with OOB)    Recommendations for Other Services       Precautions / Restrictions Precautions Precautions: Fall Restrictions Weight Bearing Restrictions: Yes LLE Weight Bearing: Weight bearing as tolerated      Mobility  Bed Mobility Overal bed mobility: Needs Assistance Bed Mobility: Rolling Rolling: Min assist General bed mobility comments: Pt is able to log roll to the R x2 trials with min A at the hips. Occasional vc's to increase  efficiency but overall pt is familiar with the technique. Increased time required. Pt is unable to logroll to the L secondary to L hip pain.   Transfers General transfer comment: Unsafe to assess 2/2 to pt lethargy  Ambulation/Gait General Gait Details: Unsafe to assess 2/2 to pt lethargy  Stairs    Wheelchair Mobility    Modified Rankin (Stroke Patients Only)           Pertinent Vitals/Pain Pain Assessment: Faces Faces Pain Scale: Hurts even more (with PROM) Pain Location: L hip Pain Descriptors / Indicators: Grimacing;Guarding Pain Intervention(s): Limited activity within patient's tolerance;Monitored during session (RN notified)  HR and O2 monitored throughout session and maintained WFL.     Home Living Family/patient expects to be discharged to:: Assisted living Home Equipment: Walker - 2 wheels (per chart review)   Prior Function Level of Independence: Needs assistance  Comments: Pt unreliable historian and no family present at time of evaluation. Per chart review, pt is w/c level and participates in transfers to/from chair.     Hand Dominance        Extremity/Trunk Assessment   Upper Extremity Assessment: Generalized weakness   Lower Extremity Assessment: Generalized weakness PF/DF at least 3/5 bilat Hip/knee flexion/extension at least 2+/5 on the R Hip abd/add at least 2+/5 on the R Decreased ability to evaluate strength LLE d/t pain    Communication   Communication: HOH  Cognition Arousal/Alertness: Lethargic (Pt with eyes closed in and out of sleep throughout session) Behavior During Therapy: Flat affect Overall Cognitive Status: No family/caregiver present to determine  baseline cognitive functioning (Oriented to self, disoriented to place/time/situation)      General Comments General comments (skin integrity, edema, etc.): Two small honeycomb bandages noted to L hip, intact with scant drainage noted.  Nursing cleared pt for participation in  physical therapy.  Pt agreeable to PT session.    Exercises General Exercises - Lower Extremity Ankle Circles/Pumps: AROM;Both;10 reps;Supine Quad Sets: AROM;Both;10 reps;Supine Short Arc Quad: AROM;AAROM;Both;10 reps;Supine (AAROM LLE; AROM RLE ) Heel Slides: AAROM;10 reps;Supine;Right (Only completed 2 reps on the left, dc'ed d/t pt discomfort) Hip ABduction/ADduction: AAROM;Both;10 reps;Supine (limited range LLE d/t discomfort) Straight Leg Raises: AAROM;Both;10 reps;Supine (limited range LLE d/t discomfort)      Assessment/Plan    PT Assessment Patient needs continued PT services  PT Diagnosis Generalized weakness   PT Problem List Decreased strength;Decreased range of motion;Decreased activity tolerance;Decreased mobility  PT Treatment Interventions DME instruction;Gait training;Functional mobility training;Therapeutic activities;Therapeutic exercise;Patient/family education   PT Goals (Current goals can be found in the Care Plan section) Acute Rehab PT Goals PT Goal Formulation: With patient Time For Goal Achievement: 05/07/16 Potential to Achieve Goals: Fair Additional Goals Additional Goal #1: Pt will propel manual w/c using BUE >4ft for household mobility    Frequency 7X/week   Barriers to discharge           End of Session Equipment Utilized During Treatment: Oxygen Activity Tolerance: Patient limited by lethargy;Patient limited by pain Patient left: in chair;with call bell/phone within reach;with bed alarm set;with SCD's reapplied Nurse Communication: Mobility status;Precautions         Time: [id number] PT Time Calculation (min) (ACUTE ONLY): 26 min   Charges:         PT G Codes:        Rashay Barnette, SPT 04/23/2016, 1:27 PM

## 2016-04-23 NOTE — Discharge Summary (Signed)
[facility] NAME: Jorge Moore    MR#:  [id number]  DATE OF BIRTH:  [birth year]  DATE OF ADMISSION:  04/20/2016 ADMITTING PHYSICIAN: Hillary Bow, MD  DATE OF DISCHARGE: No discharge date for patient encounter.  PRIMARY CARE PHYSICIAN: FITZGERALD, DAVID P, MD     ADMISSION DIAGNOSIS:  Left hip pain [M25.552] Hospice care patient [Z51.5] Closed comminuted intertrochanteric fracture of left femur ([place]) [S72.142A]  DISCHARGE DIAGNOSIS:  Active Problems:   Closed left hip fracture (HCC)   Intertrochanteric fracture of left hip (HCC)   S/P ORIF (open reduction internal fixation) fracture   Hypotension   Dementia   Anemia   Hyperglycemia   SECONDARY DIAGNOSIS:   Past Medical History:  Diagnosis Date  . Ataxia   . Atrial fibrillation ([place])   . Dementia   . Depressed   . Femur fracture ([place])   . GERD (gastroesophageal reflux disease)   . PE (pulmonary embolism)   . Prostate cancer ([place])     .pro HOSPITAL COURSE:  Patient is 82 year old Caucasian male with past medical history significant for history of dementia, bedridden and wheelchair bound, on hospice, who presents to the hospital after fall and was found to have left hip fracture. The patient had a right hip fracture in February 2017, which was fixed by Dr. Marry Guan. Apparently patient had 3 visits to emergency room due to falls in the past one week. On arrival to the hospital patient was drowsy and confused. Patient's family admitted of weight loss over the past 6 months, despite relatively good appetite. Patient underwent ORIF of intratrochanteric left hip fracture with TNF nail on 04/21/2016 by Dr. Roland Rack. He did well postoperatively except of mild transient hypotension, which responded to IV fluid administration. The patient did not require significant amount of pain medications overall, and was felt to be stable to be discharged to skilled nursing facility with  hospice. Discussion by problem: #1 displaced and angulated intratrochanteric fracture of left femur , status post ORIF August 5th, 2017 by Dr. Roland Rack,  continue pain medications, physical therapy after procedure versus discharge to facility with hospice care and pain management, patient is stable clinically. The patient is to continue Lovenox subcutaneously for 2 weeks for DVT prophylaxis.  #2. Dementia without behavioral disturbance, supportive therapy  #3 . Anemia, stable perioperatively, hemoglobin level at 10.1 today, on the day of discharge #4. Hyperglycemia, hemoglobin A1c is 5.5, no diabetes, likely stress related #5 hypotension, resolved on IV fluids, hemoglobin level remains stable   DISCHARGE CONDITIONS:   Stable  CONSULTS OBTAINED:  Treatment Team:  Corky Mull, MD  DRUG ALLERGIES:   Allergies  Allergen Reactions  . Penicillins Other (See Comments)    Reaction:  Unknown     DISCHARGE MEDICATIONS:   Current Discharge Medication List    START taking these medications   Details  albuterol (PROVENTIL) (2.5 MG/3ML) 0.083% nebulizer solution Take 3 mLs (2.5 mg total) by nebulization every 6 (six) hours as needed for wheezing or shortness of breath. Qty: 75 mL, Refills: 12    docusate sodium (COLACE) 100 MG capsule Take 1 capsule (100 mg total) by mouth 2 (two) times daily. Qty: 10 capsule, Refills: 0    enoxaparin (LOVENOX) 30 MG/0.3ML injection Inject 0.3 mLs (30 mg total) into the skin daily. Qty: 14 Syringe, Refills: 0    nystatin (MYCOSTATIN) 100000 UNIT/ML suspension Use as directed 5 mLs (500,000 Units total) in the mouth or throat  4 (four) times daily as needed (as needed). Qty: 60 mL, Refills: 0    oxyCODONE (OXY IR/ROXICODONE) 5 MG immediate release tablet Take 1 tablet (5 mg total) by mouth every 4 (four) hours as needed for moderate pain. Qty: 30 tablet, Refills: 0    polyethylene glycol (MIRALAX / GLYCOLAX) packet Take 17 g by mouth daily as needed for  mild constipation. Qty: 14 each, Refills: 0      CONTINUE these medications which have CHANGED   Details  Melatonin 3 MG TABS Take 1 tablet (3 mg total) by mouth at bedtime as needed (sleep). Qty: 30 tablet, Refills: 0    morphine (ROXANOL) 20 MG/ML concentrated solution Take 0.25 mLs (5 mg total) by mouth every hour as needed for severe pain or shortness of breath. Qty: 30 mL, Refills: 0    traMADol (ULTRAM) 50 MG tablet Take 1 tablet (50 mg total) by mouth 3 (three) times daily. Qty: 30 tablet, Refills: 0      CONTINUE these medications which have NOT CHANGED   Details  acetaminophen (TYLENOL) 500 MG tablet Take 500 mg by mouth 3 (three) times daily.    aspirin 81 MG chewable tablet Chew 81 mg by mouth daily. Reported on 11/30/2015    Cholecalciferol (VITAMIN D) 2000 units tablet Take 4,000 Units by mouth daily.    donepezil (ARICEPT) 10 MG tablet Take 10 mg by mouth daily.     feeding supplement, ENSURE ENLIVE, (ENSURE ENLIVE) LIQD Take 237 mLs by mouth 2 (two) times daily as needed.    memantine (NAMENDA) 10 MG tablet Take 10 mg by mouth daily.    Oyster Shell (OYSTER CALCIUM) 500 MG TABS tablet Take 500 mg of elemental calcium by mouth 2 (two) times daily.    senna (SENOKOT) 8.6 MG TABS tablet Take 1 tablet by mouth every other day.          DISCHARGE INSTRUCTIONS:    Patient is to follow-up with primary care physician, hospice as outpatient as previously scheduled  If you experience worsening of your admission symptoms, develop shortness of breath, life threatening emergency, suicidal or homicidal thoughts you must seek medical attention immediately by calling 911 or calling your MD immediately  if symptoms less severe.  You Must read complete instructions/literature along with all the possible adverse reactions/side effects for all the Medicines you take and that have been prescribed to you. Take any new Medicines after you have completely understood and accept all  the possible adverse reactions/side effects.   Please note  You were cared for by a hospitalist during your hospital stay. If you have any questions about your discharge medications or the care you received while you were in the hospital after you are discharged, you can call the unit and asked to speak with the hospitalist on call if the hospitalist that took care of you is not available. Once you are discharged, your primary care physician will handle any further medical issues. Please note that NO REFILLS for any discharge medications will be authorized once you are discharged, as it is imperative that you return to your primary care physician (or establish a relationship with a primary care physician if you do not have one) for your aftercare needs so that they can reassess your need for medications and monitor your lab values.    Today   CHIEF COMPLAINT:   Chief Complaint  Patient presents with  . Hip Pain    HISTORY OF PRESENT ILLNESS:  Jorge Saxon  Moore  is a 82 y.o. male with a known history of dementia, bedridden and wheelchair bound, on hospice, who presents to the hospital after fall and was found to have left hip fracture. The patient had a right hip fracture in February 2017, which was fixed by Dr. Marry Guan. Apparently patient had 3 visits to emergency room due to falls in the past one week. On arrival to the hospital patient was drowsy and confused. Patient's family admitted of weight loss over the past 6 months, despite relatively good appetite. Patient underwent ORIF of intratrochanteric left hip fracture with TNF nail on 04/21/2016 by Dr. Roland Rack. He did well postoperatively except of mild transient hypotension, which responded to IV fluid administration. The patient did not require significant amount of pain medications overall, and was felt to be stable to be discharged to skilled nursing facility with hospice. Discussion by problem: #1 displaced and angulated intratrochanteric  fracture of left femur , status post ORIF August 5th, 2017 by Dr. Roland Rack,  continue pain medications, physical therapy after procedure versus discharge to facility with hospice care and pain management, patient is stable clinically. The patient is to continue Lovenox subcutaneously for 2 weeks for DVT prophylaxis.  #2. Dementia without behavioral disturbance, supportive therapy  #3 . Anemia, stable perioperatively, hemoglobin level at 10.1 today, on the day of discharge #4. Hyperglycemia, hemoglobin A1c is 5.5, no diabetes, likely stress related #5 hypotension, resolved on IV fluids, hemoglobin level remains stable    VITAL SIGNS:  Blood pressure (!) 121/58, pulse 81, temperature 98.6 F (37 C), resp. rate 19, height 5\' 8"  (1.727 m), weight 57.2 kg (126 lb 3.2 oz), SpO2 100 %.  I/O:   Intake/Output Summary (Last 24 hours) at 04/23/16 0850 Last data filed at 04/23/16 0715  Gross per 24 hour  Intake          2514.58 ml  Output                0 ml  Net          2514.58 ml    PHYSICAL EXAMINATION:  GENERAL:  82 y.o.-year-old patient lying in the bed with no acute distress.  EYES: Pupils equal, round, reactive to light and accommodation. No scleral icterus. Extraocular muscles intact.  HEENT: Head atraumatic, normocephalic. Oropharynx and nasopharynx clear.  NECK:  Supple, no jugular venous distention. No thyroid enlargement, no tenderness.  LUNGS: Normal breath sounds bilaterally, no wheezing, rales,rhonchi or crepitation. No use of accessory muscles of respiration.  CARDIOVASCULAR: S1, S2 normal. No murmurs, rubs, or gallops.  ABDOMEN: Soft, non-tender, non-distended. Bowel sounds present. No organomegaly or mass.  EXTREMITIES: No pedal edema, cyanosis, or clubbing.  NEUROLOGIC: Cranial nerves II through XII are intact. Muscle strength 5/5 in all extremities. Sensation intact. Gait not checked.  PSYCHIATRIC: The patient is alert and oriented x 3.  SKIN: No obvious rash, lesion, or ulcer.    DATA REVIEW:   CBC  Recent Labs Lab 04/23/16 0427  WBC 9.5  HGB 10.1*  HCT 29.1*  PLT 150    Chemistries   Recent Labs Lab 04/23/16 0427  NA 141  K 3.6  CL 108  CO2 28  GLUCOSE 104*  BUN 13  CREATININE 0.72  CALCIUM 8.5*    Cardiac Enzymes  Recent Labs Lab 04/16/16 1653  TROPONINI <0.03    Microbiology Results  Results for orders placed or performed during the hospital encounter of 04/20/16  Surgical pcr screen     Status: None  Collection Time: 04/21/16  1:27 AM  Result Value Ref Range Status   MRSA, PCR NEGATIVE NEGATIVE Final   Staphylococcus aureus NEGATIVE NEGATIVE Final    Comment:        The Xpert SA Assay (FDA approved for NASAL specimens in patients over 70 years of age), is one component of a comprehensive surveillance program.  Test performance has been validated by [facility] for patients greater than or equal to 72 year old. It is not intended to diagnose infection nor to guide or monitor treatment.     RADIOLOGY:  Dg Hip Operative Unilat With Pelvis Left  Result Date: 04/21/2016 CLINICAL DATA:  Left IM nail in LRT, status post left hip fracture. EXAM: OPERATIVE LEFT HIP (WITH PELVIS IF PERFORMED)  VIEWS TECHNIQUE: Fluoroscopic spot image(s) were submitted for interpretation post-operatively. COMPARISON:  None. FINDINGS: Intraoperative film showing intra medullary fixation of the left femoral neck fracture. Osseous alignment is significantly improved. Hardware appears intact and appropriately positioned. Fluoroscopy provided for 1 minute and 0 seconds. IMPRESSION: Intraoperative film showing intra medullary fixation of the left femoral neck fracture. No evidence of surgical complicating feature. Electronically Signed   By: Franki Cabot M.D.   On: 04/21/2016 17:54    EKG:   Orders placed or performed during the hospital encounter of 04/16/16  . EKG 12-Lead  . EKG 12-Lead  . ED EKG  . ED EKG  . EKG      Management plans  discussed with the patient, family and they are in agreement.  CODE STATUS:     Code Status Orders        Start     Ordered   04/20/16 1754  Do not attempt resuscitation (DNR)  Continuous    Question Answer Comment  In the event of cardiac or respiratory ARREST Do not call a "code blue"   In the event of cardiac or respiratory ARREST Do not perform Intubation, CPR, defibrillation or ACLS   In the event of cardiac or respiratory ARREST Use medication by any route, position, wound care, and other measures to relive pain and suffering. May use oxygen, suction and manual treatment of airway obstruction as needed for comfort.      04/20/16 1754    Code Status History    Date Active Date Inactive Code Status Order ID Comments User Context   11/30/2015  6:43 AM 12/01/2015  8:00 PM Full Code [id number]  Harrie Foreman, MD Inpatient   11/03/2015 10:22 PM 11/04/2015  1:17 PM Full Code [id number]  Vaughan Basta, MD Inpatient   11/03/2015 10:16 PM 11/03/2015 10:22 PM Full Code [id number]  Dereck Leep, MD ED      TOTAL TIME TAKING CARE OF THIS PATIENT: 40 minutes.    Theodoro Grist M.D on 04/23/2016 at 8:50 AM  Between 7am to 6pm - Pager - [phone]  After 6pm go to [email or web address] - password [email or web address] [facility]  [facility]  Office  [phone]  CC: Primary care physician; Leonel Ramsay, MD

## 2016-04-23 NOTE — Progress Notes (Signed)
Visit made. Patient seen lying in bed, appeared to sleeping, staff RN Magda Paganini, aide and Speech therapist Belenda Cruise present during visit. Per staff RN report Mr. Urbieta has required 2 PRN doses of Oxycodone IR 4 mg for pain since midnight. Patient is second day post op of a reduction and internal fixation of two-part intertrochanteric left hip fracture. Per staff report and chart note review he has been difficult to rouse and has not been able to participate with any Physical Therapy. Writer spoke via telephone with patient's daughter Judeth Porch, discussed hospice home placement versus return to the [facility] with continued hospice services. She requested that patient remain at [facility] for one more day to see if he "could wake up" some more prior to making a decision regarding discharge disposition. Writer spoke with CSW Mel Almond, who in turn spoke with attending physician Dr. Ether Griffins, discharge will be delayed by one more day. Per discussion with CSW Mel Almond, [facility] will allow patient to return with hospice continuing to follow. Hospice team updated. Flo Shanks RN, BSN, [place] [facility] [place], [facility] [phone] c

## 2016-04-23 NOTE — Progress Notes (Signed)
Speech Therapy Note: received call from [facility], then order. Reviewed chart notes; pt is more lethargic this morning after several does of pain medication last night and this morning. Pt is post surgery w/ anesthesia at an advanced age as well. Pt too drowsy/sleepy for BSE at this time.  After talking w/ NSG who was noting oral holding of solids, diet consistency was modified and downgraded to a Puree consistency until BSE can be completed and pt can be more alert/awake to attend to increased textured foods. Recommended meds in puree - crushed as able. ST will f/u tomorrow. NSG agreed.

## 2016-04-23 NOTE — Progress Notes (Signed)
Daughter at bedside, noted feeding pt. Education provided on swallowing precautions, making sure pt is alert enough to eat/chew/swallow. Puree diet was served, explained to daughter safety concerns with pt chewing this AM. Was able to eat several bites of mashed potatoes, no pocketing seen at this time. Karen from Hospice at bedside speaking with family.

## 2016-04-23 NOTE — Progress Notes (Signed)
Writer returned to pateint's room for follow up visit and to talk with daughter Judeth Porch, daughter Pamala Hurry present. Patient appeared to be sleeping, Pamala Hurry reports she fed him lunch, which he ate approximately 25% of. She reports that Judeth Porch will be present later this afternoon. CSW Mel Almond in during visit to advise that patient's discharge has been cancelled for today. Writer to return again to speak with Judeth Porch later this afternoon. Flo Shanks RN, BSN, [facility] [place], hospital Liaison  [phone] c

## 2016-04-23 NOTE — Care Management Note (Signed)
Case Management Note  Patient Details  Name: DEMARIEN SERPE MRN: [id number] Date of Birth: 10-Jul-1924  Subjective/Objective:                    Action/Plan: CSW and Hospice following for discharge planning. Will continue to follow. Anticipated discharge at this point is Hospice.   Expected Discharge Date:  05/09/16               Expected Discharge Plan:  Home w Hospice Care  In-House Referral:  Clinical Social Work, Hospice / Palliative Care  Discharge planning Services  CM Consult  Post Acute Care Choice:    Choice offered to:     DME Arranged:    DME Agency:     HH Arranged:    [place] Agency:     Status of Service:  Completed, signed off  If discussed at [employer], dates discussed:    Additional Comments:  Alvie Heidelberg, RN 04/23/2016, 11:45 AM

## 2016-04-23 NOTE — Progress Notes (Signed)
Pt rested most of shift, able to rouse at times, awakens for meals and toileting. Pt has intermittent confusion, disoriented to place, time and situation but is pleasant and cooperative. No oxycodone given this shift due to sedation, Pt rested comfortably except when moved/turned expressed discomfort then returns back to sleep. IV fluids ordered per MD due to low urine output, dark urine color and poor PO intake of fluids. Will continue to monitor.

## 2016-04-23 NOTE — Clinical Social Work Note (Signed)
Clinical Social Work Assessment  Patient Details  Name: Jorge Moore MRN: [id number] Date of Birth: [birth year]  Date of referral:  04/23/16               Reason for consult:  Facility Placement                Permission sought to share information with:  Facility Art therapist granted to share information::  Yes, Verbal Permission Granted  Name::        Agency::     Relationship::     Contact Information:     Housing/Transportation Living arrangements for the past 2 months:  [place] of Information:  Adult Children Patient Interpreter Needed:  None Criminal Activity/Legal Involvement Pertinent to Current Situation/Hospitalization:  No - Comment as needed Significant Relationships:  Adult Children Lives with:  Facility Resident Do you feel safe going back to the place where you live?  Yes Need for family participation in patient care:  Yes (Comment)  Care giving concerns:  Patient is a long term care resident at [facility].    Social Worker assessment / plan:  Holiday representative ([place]) reviewed chart and noted that patient is from [facility] followed by [employer] Per PT patient is not SNF short term rehab appropriate. CSW contacted patient's daughter Jorge Moore. Per daughter patient is private pay at [employer] and pays around $3,100 per month and does not qualify for Medicaid. Per daughter they have looked into private pay at a SNF level of care and patient cannot afford it. CSW explained to daughter patient patient is not appropriate to go to SNF under his Medicare and the only option may be going back to [facility]. Per daughter she would like for patient to go to the [facility].   [facility] liaison spoke with patient's daughters and reported that they are leaning towards going back to [facility] because patient was been more alert today. Per Tia the Director  at [employer] patient approved for patient to return to  [facility] with hospice services. MD has cancelled discharge order to see if patient will wake up more over the next 24 hours. Patient will likely return to [facility]. CSW will continue to follow and assist as needed.    Employment status:  Retired Forensic scientist:  Medicare PT Recommendations:  No Follow Up, [place] / Referral to community resources:  Other (Comment Required) (Return to ALF )  Patient/Family's Response to care:  Patient's daughter are leaning towards taking patient back to [facility].   Patient/Family's Understanding of and Emotional Response to Diagnosis, Current Treatment, and Prognosis:  Patient's daughter Jorge Moore was emotional and reported that her sister is being taken off the vent today. CSW provided emotional support.   Emotional Assessment Appearance:  Appears stated age Attitude/Demeanor/Rapport:  Unable to Assess Affect (typically observed):  Unable to Assess Orientation:  Oriented to Self, Fluctuating Orientation (Suspected and/or reported Sundowners), Oriented to Place Alcohol / Substance use:  Not Applicable Psych involvement (Current and /or in the community):  No (Comment)  Discharge Needs  Concerns to be addressed:  Discharge Planning Concerns Readmission within the last 30 days:  No Current discharge risk:  Chronically ill, Cognitively Impaired, Dependent with Mobility Barriers to Discharge:  Continued Medical Work up   [employer], [place], LCSW 04/23/2016, 4:10 PM

## 2016-04-23 NOTE — Progress Notes (Signed)
  Subjective: 2 Days Post-Op Procedure(s) (LRB): INTRAMEDULLARY (IM) NAIL INTERTROCHANTRIC (Left) Patient reports pain as mild.   Patient is well, and has had no acute complaints or problems Plan is to go Skilled nursing facility after hospital stay. Negative for chest pain and shortness of breath Fever: no Gastrointestinal:Negative for nausea and vomiting  Objective: Vital signs in last 24 hours: Temp:  [98.2 F (36.8 C)-98.7 F (37.1 C)] 98.6 F (37 C) (08/07 0347) Pulse Rate:  [75-84] 81 (08/07 0347) Resp:  [18-19] 19 (08/07 0347) BP: (90-121)/(42-58) 121/58 (08/07 0347) SpO2:  [97 %-100 %] 100 % (08/07 0347) Weight:  [57.2 kg (126 lb 3.2 oz)] 57.2 kg (126 lb 3.2 oz) (08/07 0500)  Intake/Output from previous day:  Intake/Output Summary (Last 24 hours) at 04/23/16 0747 Last data filed at 04/23/16 0715  Gross per 24 hour  Intake          2514.58 ml  Output                0 ml  Net          2514.58 ml    Intake/Output this shift: No intake/output data recorded.  Labs:  Recent Labs  04/20/16 1519 04/21/16 0355 04/22/16 0444 04/22/16 1255 04/23/16 0427  HGB 11.6* 11.7* 11.0* 10.7* 10.1*    Recent Labs  04/22/16 0444 04/23/16 0427  WBC 9.7 9.5  RBC 3.17* 2.98*  HCT 31.2* 29.1*  PLT 138* 150    Recent Labs  04/22/16 0444 04/23/16 0427  NA 141 141  K 4.2 3.6  CL 105 108  CO2 30 28  BUN 12 13  CREATININE 0.78 0.72  GLUCOSE 131* 104*  CALCIUM 8.8* 8.5*   No results for input(s): LABPT, INR in the last 72 hours.   EXAM General - Patient is Disorganized, Lacking and drowsy this AM but answers questions appropriately pertaining to his left hip Extremity - ABD soft Sensation intact distally Intact pulses distally Dorsiflexion/Plantar flexion intact Incision: dressing C/D/I No cellulitis present Dressing/Incision - clean, dry, no drainage Motor Function - intact, moving foot and toes well on exam.   Negative Homan's to bilateral lower  extremities.  Past Medical History:  Diagnosis Date  . Ataxia   . Atrial fibrillation ([place])   . Dementia   . Depressed   . Femur fracture ([place])   . GERD (gastroesophageal reflux disease)   . PE (pulmonary embolism)   . Prostate cancer ([place])     Assessment/Plan: 2 Days Post-Op Procedure(s) (LRB): INTRAMEDULLARY (IM) NAIL INTERTROCHANTRIC (Left) Active Problems:   Closed left hip fracture (HCC)  Estimated body mass index is 19.19 kg/m as calculated from the following:   Height as of this encounter: 5\' 8"  (1.727 m).   Weight as of this encounter: 57.2 kg (126 lb 3.2 oz). Advance diet Up with therapy  Labs reviewed, Hg stable at 10.1. BP 121/58 this AM Pt has had a BM Pt is wheelchair bound on hospice care. Plan will be for discharge to SNF or hospice when appropriate.  DVT Prophylaxis - Lovenox, Foot Pumps and TED hose Weight-Bearing as tolerated to left leg  J. Cameron Proud, PA-C [facility] 04/23/2016, 7:47 AM

## 2016-04-23 NOTE — Progress Notes (Signed)
Pt noted to be drowsy this AM, was not able to eat much of breakfast this AM, pocketed food in cheek, nursing concerned with aspiration risk. SLP eval ordered. Pt sleeping, groans/grimaces with movement or turning. Spoke with Santiago Glad from Hospice, will confer with MD and SW regarding plan for this pt.

## 2016-04-23 NOTE — NC FL2 (Signed)
[place] LEVEL OF CARE SCREENING TOOL     IDENTIFICATION  Patient Name: Jorge Moore Birthdate: 1924-04-19 Sex: male Admission Date (Current Location): 04/20/2016  [facility] and [place] Number:  Engineering geologist and Address:  [facility], [address], [place], [place] [address]      Provider Number: [id number]  Attending Physician Name and Address:  Theodoro Grist, MD  Relative Name and Phone Number:       Current Level of Care: Hospital Recommended Level of Care: [place] Prior Approval Number:    Date Approved/Denied:   PASRR Number:  ( [id number] )  Discharge Plan: Domiciliary (Rest home)    Current Diagnoses: Patient Active Problem List   Diagnosis Date Noted  . Intertrochanteric fracture of left hip ([place]) 04/23/2016  . S/P ORIF (open reduction internal fixation) fracture 04/23/2016  . Dementia 04/23/2016  . Anemia 04/23/2016  . Hyperglycemia 04/23/2016  . Hypotension 04/23/2016  . Closed left hip fracture ([place]) 04/20/2016  . Syncope 11/30/2015  . Atrial fibrillation ([place]) 11/11/2015  . Degeneration of intervertebral disc of lumbosacral region 11/11/2015  . Atheroma, skin 11/11/2015  . Hip fracture, right ([place]) 11/03/2015  . Hip fracture ([place]) 11/03/2015  . Cerebral ventriculomegaly 07/22/2015  . Abnormal computed tomography of head 05/25/2015  . Fall 10/05/2014  . Appendicular ataxia 10/05/2014  . 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 Pulmonary embolism ([place]) 11/16/2010    Orientation RESPIRATION BLADDER Height & Weight     Self  O2 (2 Liters Oxygen ) Incontinent Weight: 126 lb 3.2 oz (57.2 kg) Height:  5\' 8"  (172.7 cm)  BEHAVIORAL SYMPTOMS/MOOD NEUROLOGICAL BOWEL NUTRITION STATUS   (none )  (none ) Continent Diet (Diet: DYS 1 )  AMBULATORY STATUS COMMUNICATION OF NEEDS Skin   Extensive Assist (Wheel Chair Bound ) Verbally Surgical wounds (Incision: Left Hip. )                        Personal Care Assistance Level of Assistance  Bathing, Feeding, Dressing Bathing Assistance: Limited assistance Feeding assistance: Limited assistance Dressing Assistance: Limited assistance     Functional Limitations Info  Sight, Speech, Hearing Sight Info: Adequate Hearing Info: Adequate Speech Info: Adequate    SPECIAL CARE FACTORS FREQUENCY                       Contractures      Additional Factors Info  Code Status, Allergies Code Status Info:  (DNR ) Allergies Info:  (Penicillins)           Current Medications (04/23/2016):  This is the current hospital active medication list Current Facility-Administered Medications  Medication Dose Route Frequency Provider Last Rate Last Dose  . acetaminophen (TYLENOL) tablet 650 mg  650 mg Oral Q6H PRN Corky Mull, MD       Or  . acetaminophen (TYLENOL) suppository 650 mg  650 mg Rectal Q6H PRN Corky Mull, MD      . albuterol (PROVENTIL) (2.5 MG/3ML) 0.083% nebulizer solution 2.5 mg  2.5 mg Nebulization Q6H PRN Theodoro Grist, MD      . bisacodyl (DULCOLAX) suppository 10 mg  10 mg Rectal Daily PRN Corky Mull, MD      . docusate sodium (COLACE) capsule 100 mg  100 mg Oral BID Corky Mull, MD   100 mg at 04/22/16 2031  . donepezil (ARICEPT) tablet 10 mg  10 mg  Oral Daily Hillary Bow, MD   10 mg at 04/22/16 0824  . enoxaparin (LOVENOX) injection 30 mg  30 mg Subcutaneous Q24H Corky Mull, MD   30 mg at 04/22/16 [id number]  . magnesium hydroxide (MILK OF MAGNESIA) suspension 30 mL  30 mL Oral Daily PRN Corky Mull, MD      . memantine [facility]) tablet 10 mg  10 mg Oral Daily Hillary Bow, MD   10 mg at 04/22/16 0824  . metoCLOPramide (REGLAN) tablet 5-10 mg  5-10 mg Oral Q8H PRN Corky Mull, MD       Or  . metoCLOPramide (REGLAN) injection 5-10 mg  5-10 mg Intravenous Q8H PRN Corky Mull, MD      . morphine 2 MG/ML injection 1 mg  1 mg Intravenous Q6H PRN Epifanio Lesches, MD   1 mg at 04/21/16 1836   . nystatin (MYCOSTATIN) 100000 UNIT/ML suspension 500,000 Units  5 mL Mouth/Throat QID PRN Theodoro Grist, MD   500,000 Units at 04/22/16 1619  . ondansetron (ZOFRAN) tablet 4 mg  4 mg Oral Q6H PRN Corky Mull, MD       Or  . ondansetron [facility]) injection 4 mg  4 mg Intravenous Q6H PRN Corky Mull, MD      . oxyCODONE (Oxy IR/ROXICODONE) immediate release tablet 5 mg  5 mg Oral Q4H PRN Hillary Bow, MD   5 mg at 04/23/16 0518  . pantoprazole (PROTONIX) EC tablet 40 mg  40 mg Oral Daily Corky Mull, MD   40 mg at 04/22/16 [id number]  . polyethylene glycol (MIRALAX / GLYCOLAX) packet 17 g  17 g Oral Daily PRN Hillary Bow, MD      . senna (SENOKOT) tablet 8.6 mg  1 tablet Oral BID Hillary Bow, MD   8.6 mg at 04/22/16 2031  . sodium phosphate (FLEET) 7-19 GM/118ML enema 1 enema  1 enema Rectal Once PRN Corky Mull, MD         Discharge Medications: Please see discharge summary for a list of discharge medications.  Relevant Imaging Results:  Relevant Lab Results:   Additional Information  (SSN: [id number])  Leovardo Thoman, Veronia Beets, LCSW

## 2016-04-24 LAB — BASIC METABOLIC PANEL
Anion gap: 2 — ABNORMAL LOW (ref 5–15)
BUN: 13 mg/dL (ref 6–20)
CALCIUM: 8.5 mg/dL — AB (ref 8.9–10.3)
CO2: 30 mmol/L (ref 22–32)
CREATININE: 0.73 mg/dL (ref 0.61–1.24)
Chloride: 109 mmol/L (ref 101–111)
GFR calc Af Amer: >60 mL/min (ref >60)
Glucose, Bld: 126 mg/dL — ABNORMAL HIGH (ref 65–99)
POTASSIUM: 3.6 mmol/L (ref 3.5–5.1)
SODIUM: 141 mmol/L (ref 135–145)

## 2016-04-24 MED ORDER — ENOXAPARIN SODIUM 40 MG/0.4ML ~~LOC~~ SOLN: 40.0000 mg | SUBCUTANEOUS | 0 refills | Status: AC

## 2016-04-24 NOTE — Progress Notes (Signed)
Discharged patient to the [facility] via ems.

## 2016-04-24 NOTE — Progress Notes (Signed)
  Subjective: 3 Days Post-Op Procedure(s) (LRB): INTRAMEDULLARY (IM) NAIL INTERTROCHANTRIC (Left) Patient reports pain as mild.   Patient is well, and has had no acute complaints or problems Plan is to go Skilled nursing facility after hospital stay. Negative for chest pain and shortness of breath Fever: no Gastrointestinal:Negative for nausea and vomiting  Objective: Vital signs in last 24 hours: Temp:  [97.7 F (36.5 C)-98.4 F (36.9 C)] 97.7 F (36.5 C) (08/08 0438) Pulse Rate:  [73-84] 84 (08/07 1919) Resp:  [18] 18 (08/08 0438) BP: (107-113)/(48-52) 113/52 (08/08 0438) SpO2:  [96 %-99 %] 99 % (08/08 0438) Weight:  [56.7 kg (125 lb)] 56.7 kg (125 lb) (08/08 0448)  Intake/Output from previous day:  Intake/Output Summary (Last 24 hours) at 04/24/16 0801 Last data filed at 04/23/16 1814  Gross per 24 hour  Intake                0 ml  Output              176 ml  Net             -176 ml    Intake/Output this shift: No intake/output data recorded.  Labs:  Recent Labs  04/22/16 0444 04/22/16 1255 04/23/16 0427  HGB 11.0* 10.7* 10.1*    Recent Labs  04/22/16 0444 04/23/16 0427  WBC 9.7 9.5  RBC 3.17* 2.98*  HCT 31.2* 29.1*  PLT 138* 150    Recent Labs  04/23/16 0427 04/24/16 0441  NA 141 141  K 3.6 3.6  CL 108 109  CO2 28 30  BUN 13 13  CREATININE 0.72 0.73  GLUCOSE 104* 126*  CALCIUM 8.5* 8.5*   No results for input(s): LABPT, INR in the last 72 hours.   EXAM General - Patient is Disorganized, Lacking and drowsy this AM but answers questions appropriately pertaining to his left hip Extremity - ABD soft Sensation intact distally Intact pulses distally Dorsiflexion/Plantar flexion intact Incision: dressing C/D/I No cellulitis present Dressing/Incision - clean, dry, no drainage Motor Function - intact, moving foot and toes well on exam.   Negative Homan's to bilateral lower extremities.  Past Medical History:  Diagnosis Date  . Ataxia    . Atrial fibrillation ([place])   . Dementia   . Depressed   . Femur fracture ([place])   . GERD (gastroesophageal reflux disease)   . PE (pulmonary embolism)   . Prostate cancer ([place])     Assessment/Plan: 3 Days Post-Op Procedure(s) (LRB): INTRAMEDULLARY (IM) NAIL INTERTROCHANTRIC (Left) Active Problems:   Closed left hip fracture (H[facility])   Intertrochanteric fracture of left hip (H[facility])   S/P ORIF (open reduction internal fixation) fracture   Dementia   Anemia   Hyperglycemia   Hypotension  Estimated body mass index is 19.01 kg/m as calculated from the following:   Height as of this encounter: 5\' 8"  (1.727 m).   Weight as of this encounter: 56.7 kg (125 lb). Advance diet Up with therapy  Labs reviewed this AM.  BP 113/52 Pt has had a BM Pt is wheelchair bound on hospice care. Plan will be for discharge to hospice when medically appropriate. Upon discharge, Lovenox 40mg  daily x 14 days. Staples can be removed on 05/03/16. Follow-up with either Dr. Roland Rack or Cameron Proud, PA-C with [place] in 4-6 weeks.  DVT Prophylaxis - Lovenox, Foot Pumps and TED hose Weight-Bearing as tolerated to left leg  J. Cameron Proud, PA-C [facility] Orthopaedic Surgery 04/24/2016, 8:01 AM

## 2016-04-24 NOTE — Evaluation (Signed)
Clinical/Bedside Swallow Evaluation Patient Details  Name: Jorge Moore MRN: [id number] Date of Birth: [birth year]  Today's Date: 04/24/2016 Time: SLP Start Time (ACUTE ONLY): 1055 SLP Stop Time (ACUTE ONLY): 1155 SLP Time Calculation (min) (ACUTE ONLY): 60 min  Past Medical History:  Past Medical History:  Diagnosis Date  . Ataxia   . Atrial fibrillation ([place])   . Dementia   . Depressed   . Femur fracture ([place])   . GERD (gastroesophageal reflux disease)   . PE (pulmonary embolism)   . Prostate cancer [facility])    Past Surgical History:  Past Surgical History:  Procedure Laterality Date  . INTRAMEDULLARY (IM) NAIL INTERTROCHANTERIC Right 11/04/2015   Procedure: INTRAMEDULLARY (IM) NAIL INTERTROCHANTRIC;  Surgeon: Dereck Leep, MD;  Location: [facility] ORS;  Service: Orthopedics;  Laterality: Right;  . INTRAMEDULLARY (IM) NAIL INTERTROCHANTERIC Left 04/21/2016   Procedure: INTRAMEDULLARY (IM) NAIL INTERTROCHANTRIC;  Surgeon: Corky Mull, MD;  Location: [facility] ORS;  Service: Orthopedics;  Laterality: Left;   HPI:  Patient is 82 year old Caucasian male with past medical history significant for history of dementia, bedridden and wheelchair bound, on hospice services, who presents to the hospital after fall and was found to have left hip fracture. The patient had a right hip fracture in February 2017, which was fixed by Dr. Marry Guan. Apparently patient had 3 visits to emergency room due to falls in the past one week. On arrival to the hospital patient was drowsy and confused. Patient's family admitted of weight loss over the past 6 months. Patient underwent ORIF of intratrochanteric left hip fracture with TNF nail on 04/21/2016 by Dr. Roland Rack. He did well postoperatively except of mild transient hypotension, which responded to IV fluid administration. The patient did not require significant amount of pain medications overall but when given 3 doses, he became more drowsy and lethargic thus not awake to  safely take a po diet. Mental status has improved with IVF administration as well, oral intake has improved after changing the diet consistency from Regular to Puree; patient is eating dysphagia 1 diet with thin liquids which was the diet consistency recommended yesterday by SLP. Pt is more alert today, confusion baseline and requires cues to follow through w/ tasks. Pt requires assistance w/ feeding d/t Cognitive status. Baseline Cognitive decline/Dementia may be impacting his overall intake as this can be common.    Assessment / Plan / Recommendation Clinical Impression  Pt appeared to adequately tolerate trials of thin liquids and Purees w/ no overt s/s of aspiration noted; no coughing or decline in respiratory status post trials and vocal quality remained gravely, low. Pt presented w/ no oral phase deficits w/ these consistencies - exhibited a timely A-P transfer and swallow w/ boluses given. Pt took time to use lingual sweeping to fully clear his mouth post bites of puree. Pt required feeding assistance - baseline(?) d/t Cognition. Recommend a Dysphagia 1(puree) diet consistency at this time for easier oral intake of solids d/t Cognitive decline and overall weakness; thin liquids. Recommend aspiration precautions; Meds in puree - Crushed as able. ST services will be available for futher education if needed prior to discharge to SNF today.     Aspiration Risk   (reduced following aspiration precautions)    Diet Recommendation  Dysphagia 1(puree) diet w/ thin liquids; aspiration precautions. Feeding Assistance at meals.   Medication Administration: Crushed with puree    Other  Recommendations Recommended Consults:  (Dietician f/u) Oral Care Recommendations: Oral care BID;Staff/trained caregiver to provide oral care  Follow up Recommendations   (TBD) at SNF as indicated   Frequency and Duration            Prognosis Prognosis for Safe Diet Advancement: Fair (-Good) Barriers to Reach Goals:  Cognitive deficits      Swallow Study   General Date of Onset: 04/20/16 HPI: Patient is 82 year old Caucasian male with past medical history significant for history of dementia, bedridden and wheelchair bound, on hospice services, who presents to the hospital after fall and was found to have left hip fracture. The patient had a right hip fracture in February 2017, which was fixed by Dr. Marry Guan. Apparently patient had 3 visits to emergency room due to falls in the past one week. On arrival to the hospital patient was drowsy and confused. Patient's family admitted of weight loss over the past 6 months. Patient underwent ORIF of intratrochanteric left hip fracture with TNF nail on 04/21/2016 by Dr. Roland Rack. He did well postoperatively except of mild transient hypotension, which responded to IV fluid administration. The patient did not require significant amount of pain medications overall but when given 3 doses, he became more drowsy and lethargic thus not awake to safely take a po diet. Mental status has improved with IVF administration as well, oral intake has improved after changing the diet consistency from Regular to Puree; patient is eating dysphagia 1 diet with thin liquids which was the diet consistency recommended yesterday by SLP. Pt is more alert today, confusion baseline and requires cues to follow through w/ tasks. Pt requires assistance w/ feeding d/t Cognitive status. Baseline Cognitive decline/Dementia may be impacting his overall intake as this can be common.  Type of Study: Bedside Swallow Evaluation Previous Swallow Assessment: none Diet Prior to this Study: Regular;Thin liquids Temperature Spikes Noted: No (wbc not elevated) Respiratory Status: Nasal cannula (2 liters) Behavior/Cognition: Alert;Cooperative;Pleasant mood;Confused;Requires cueing;Distractible Oral Cavity Assessment: Within Functional Limits Oral Care Completed by SLP: Recent completion by staff Oral Cavity - Dentition:  Dentures, top (no bottom dentition) Vision:  (n/a) Self-Feeding Abilities: Total assist Patient Positioning: Upright in bed Baseline Vocal Quality: Low vocal intensity Volitional Cough: Cognitively unable to elicit Volitional Swallow: Unable to elicit    Oral/Motor/Sensory Function Overall Oral Motor/Sensory Function: Within functional limits (w/ bolus management and movements)   Ice Chips Ice chips: Not tested   Thin Liquid Thin Liquid: Within functional limits Presentation: Straw;Self Fed (helped to hold cup w/ SLP; ~3 ozs)    Nectar Thick Nectar Thick Liquid: Not tested   Honey Thick Honey Thick Liquid: Not tested   Puree Puree: Within functional limits Presentation: Spoon (fed; 9 trials)   Solid   GO   Solid: Not tested Other Comments: not a recommended consistency at this time       Orinda Kenner, MS, CCC-SLP  Watson,Katherine 04/24/2016,12:00 PM

## 2016-04-24 NOTE — Progress Notes (Signed)
Discharge to skilled nursing facility per Dr. Ether Griffins.

## 2016-04-24 NOTE — Discharge Summary (Addendum)
[facility] [place] NAME: Jorge Moore    MR#:  [id number]  DATE OF BIRTH:  16-Jun-1924  DATE OF ADMISSION:  04/20/2016 ADMITTING PHYSICIAN: Hillary Bow, MD  DATE OF DISCHARGE: No discharge date for patient encounter.  PRIMARY CARE PHYSICIAN: FITZGERALD, DAVID P, MD     ADMISSION DIAGNOSIS:  Left hip pain [M25.552] Hospice care patient [Z51.5] Closed comminuted intertrochanteric fracture of left femur ([place]) [S72.142A]  DISCHARGE DIAGNOSIS:  Active Problems:   Closed left hip fracture (HCC)   Intertrochanteric fracture of left hip (HCC)   S/P ORIF (open reduction internal fixation) fracture   Hypotension   Dementia   Anemia   Hyperglycemia   SECONDARY DIAGNOSIS:   Past Medical History:  Diagnosis Date  . Ataxia   . Atrial fibrillation ([place])   . Dementia   . Depressed   . Femur fracture ([place])   . GERD (gastroesophageal reflux disease)   . PE (pulmonary embolism)   . Prostate cancer ([place])     .pro HOSPITAL COURSE:  Patient is 82 year old Caucasian male with past medical history significant for history of dementia, bedridden and wheelchair bound, on hospice, who presents to the hospital after fall and was found to have left hip fracture. The patient had a right hip fracture in February 2017, which was fixed by Dr. Marry Guan. Apparently patient had 3 visits to emergency room due to falls in the past one week. On arrival to the hospital patient was drowsy and confused. Patient's family admitted of weight loss over the past 6 months, despite relatively good appetite. Patient underwent ORIF of intratrochanteric left hip fracture with TNF nail on 04/21/2016 by Dr. Roland Rack. He did well postoperatively except of mild transient hypotension, which responded to IV fluid administration. The patient did not require significant amount of pain medications overall, and was felt to be stable to be discharged to skilled nursing facility with  hospice. Mental status has improved with IVF administration as well, oral intake has improved, patient is eating dysphagia 1 diet with thin liquids.  Discussion by problem: #1 displaced and angulated intratrochanteric fracture of left femur , status post ORIF August 5th, 2017 by Dr. Roland Rack,  continue pain medications, physical therapy after procedure versus discharge to facility with hospice care and pain management, patient is stable clinically. The patient is to continue Lovenox subcutaneously for 2 weeks for DVT prophylaxis. Staples can be removed 8.17.17, per ortho, follow up with ortho in 4-6 weeks. Weight-Bearing as tolerated to left leg.  #2. Dementia without behavioral disturbance, supportive therapy  #3 . Anemia, stable perioperatively, hemoglobin level at 10.1 today, on the day of discharge #4. Hyperglycemia, hemoglobin A1c is 5.5, no diabetes, likely stress related #5 hypotension, resolved on IV fluids, hemoglobin level remains stable   DISCHARGE CONDITIONS:   Stable  CONSULTS OBTAINED:  Treatment Team:  Corky Mull, MD  DRUG ALLERGIES:   Allergies  Allergen Reactions  . Penicillins Other (See Comments)    Reaction:  Unknown     DISCHARGE MEDICATIONS:   Current Discharge Medication List    START taking these medications   Details  albuterol (PROVENTIL) (2.5 MG/3ML) 0.083% nebulizer solution Take 3 mLs (2.5 mg total) by nebulization every 6 (six) hours as needed for wheezing or shortness of breath. Qty: 75 mL, Refills: 12    docusate sodium (COLACE) 100 MG capsule Take 1 capsule (100 mg total) by mouth 2 (two) times daily. Qty: 10 capsule, Refills: 0  enoxaparin (LOVENOX) 40 MG/0.4ML injection Inject 0.4 mLs (40 mg total) into the skin daily. Qty: 14 Syringe, Refills: 0    nystatin (MYCOSTATIN) 100000 UNIT/ML suspension Use as directed 5 mLs (500,000 Units total) in the mouth or throat 4 (four) times daily as needed (as needed). Qty: 60 mL, Refills: 0     oxyCODONE (OXY IR/ROXICODONE) 5 MG immediate release tablet Take 1 tablet (5 mg total) by mouth every 4 (four) hours as needed for moderate pain. Qty: 30 tablet, Refills: 0    polyethylene glycol (MIRALAX / GLYCOLAX) packet Take 17 g by mouth daily as needed for mild constipation. Qty: 14 each, Refills: 0      CONTINUE these medications which have CHANGED   Details  Melatonin 3 MG TABS Take 1 tablet (3 mg total) by mouth at bedtime as needed (sleep). Qty: 30 tablet, Refills: 0    morphine (ROXANOL) 20 MG/ML concentrated solution Take 0.25 mLs (5 mg total) by mouth every hour as needed for severe pain or shortness of breath. Qty: 30 mL, Refills: 0    traMADol (ULTRAM) 50 MG tablet Take 1 tablet (50 mg total) by mouth 3 (three) times daily. Qty: 30 tablet, Refills: 0      CONTINUE these medications which have NOT CHANGED   Details  acetaminophen (TYLENOL) 500 MG tablet Take 500 mg by mouth 3 (three) times daily.    aspirin 81 MG chewable tablet Chew 81 mg by mouth daily. Reported on 11/30/2015    Cholecalciferol (VITAMIN D) 2000 units tablet Take 4,000 Units by mouth daily.    donepezil (ARICEPT) 10 MG tablet Take 10 mg by mouth daily.     feeding supplement, ENSURE ENLIVE, (ENSURE ENLIVE) LIQD Take 237 mLs by mouth 2 (two) times daily as needed.    memantine (NAMENDA) 10 MG tablet Take 10 mg by mouth daily.    Oyster Shell (OYSTER CALCIUM) 500 MG TABS tablet Take 500 mg of elemental calcium by mouth 2 (two) times daily.    senna (SENOKOT) 8.6 MG TABS tablet Take 1 tablet by mouth every other day.          DISCHARGE INSTRUCTIONS:    Patient is to follow-up with primary care physician, hospice as outpatient as previously scheduled  If you experience worsening of your admission symptoms, develop shortness of breath, life threatening emergency, suicidal or homicidal thoughts you must seek medical attention immediately by calling 911 or calling your MD immediately  if  symptoms less severe.  You Must read complete instructions/literature along with all the possible adverse reactions/side effects for all the Medicines you take and that have been prescribed to you. Take any new Medicines after you have completely understood and accept all the possible adverse reactions/side effects.   Please note  You were cared for by a hospitalist during your hospital stay. If you have any questions about your discharge medications or the care you received while you were in the hospital after you are discharged, you can call the unit and asked to speak with the hospitalist on call if the hospitalist that took care of you is not available. Once you are discharged, your primary care physician will handle any further medical issues. Please note that NO REFILLS for any discharge medications will be authorized once you are discharged, as it is imperative that you return to your primary care physician (or establish a relationship with a primary care physician if you do not have one) for your aftercare needs so that  they can reassess your need for medications and monitor your lab values.    Today   CHIEF COMPLAINT:   Chief Complaint  Patient presents with  . Hip Pain    HISTORY OF PRESENT ILLNESS:  Jorge Moore  is a 82 y.o. male with a known history of dementia, bedridden and wheelchair bound, on hospice, who presents to the hospital after fall and was found to have left hip fracture. The patient had a right hip fracture in February 2017, which was fixed by Dr. Marry Guan. Apparently patient had 3 visits to emergency room due to falls in the past one week. On arrival to the hospital patient was drowsy and confused. Patient's family admitted of weight loss over the past 6 months, despite relatively good appetite. Patient underwent ORIF of intratrochanteric left hip fracture with TNF nail on 04/21/2016 by Dr. Roland Rack. He did well postoperatively except of mild transient hypotension, which  responded to IV fluid administration. The patient did not require significant amount of pain medications overall, and was felt to be stable to be discharged to skilled nursing facility with hospice. Mental status has improved with IVF administration as well, oral intake has improved, patient is eating dysphagia 1 diet with thin liquids. Discussion by problem: #1 displaced and angulated intratrochanteric fracture of left femur , status post ORIF August 5th, 2017 by Dr. Roland Rack,  continue pain medications, physical therapy after procedure versus discharge to facility with hospice care and pain management, patient is stable clinically. The patient is to continue Lovenox 40 mg subcutaneously for 2 weeks for DVT prophylaxis. Staples can be removed 8.17.17, per ortho, follow up with ortho in 4-6 weeks.  Weight-Bearing as tolerated to left leg.  #2. Dementia without behavioral disturbance, supportive therapy  #3 . Anemia, stable perioperatively, hemoglobin level at 10.1 today, on the day of discharge #4. Hyperglycemia, hemoglobin A1c is 5.5, no diabetes, likely stress related #5 hypotension, resolved on IV fluids, hemoglobin level remains stable    VITAL SIGNS:  Blood pressure (!) 120/57, pulse 66, temperature 97.9 F (36.6 C), temperature source Oral, resp. rate 18, height 5\' 8"  (1.727 m), weight 56.7 kg (125 lb), SpO2 100 %.  I/O:    Intake/Output Summary (Last 24 hours) at 04/24/16 0912 Last data filed at 04/23/16 1814  Gross per 24 hour  Intake                0 ml  Output              176 ml  Net             -176 ml    PHYSICAL EXAMINATION:  GENERAL:  82 y.o.-year-old patient lying in the bed with no acute distress. More alert today and interactive, able to answer some questions and follow commands EYES: Pupils equal, round, reactive to light and accommodation. No scleral icterus. Extraocular muscles intact.  HEENT: Head atraumatic, normocephalic. Oropharynx and nasopharynx clear.  NECK:   Supple, no jugular venous distention. No thyroid enlargement, no tenderness.  LUNGS: Normal breath sounds bilaterally, no wheezing, rales,rhonchi or crepitation. No use of accessory muscles of respiration.  CARDIOVASCULAR: S1, S2 normal. No murmurs, rubs, or gallops.  ABDOMEN: Soft, non-tender, non-distended. Bowel sounds present. No organomegaly or mass.  EXTREMITIES: No pedal edema, cyanosis, or clubbing.  NEUROLOGIC: Cranial nerves II through XII are intact. Muscle strength 5/5 in all extremities. Sensation intact. Gait not checked.  PSYCHIATRIC: The patient is alert and oriented x 2.  SKIN: No  obvious rash, lesion, or ulcer.   DATA REVIEW:   CBC  Recent Labs Lab 04/23/16 0427  WBC 9.5  HGB 10.1*  HCT 29.1*  PLT 150    Chemistries   Recent Labs Lab 04/24/16 0441  NA 141  K 3.6  CL 109  CO2 30  GLUCOSE 126*  BUN 13  CREATININE 0.73  CALCIUM 8.5*    Cardiac Enzymes No results for input(s): TROPONINI in the last 168 hours.  Microbiology Results  Results for orders placed or performed during the hospital encounter of 04/20/16  Surgical pcr screen     Status: None   Collection Time: 04/21/16  1:27 AM  Result Value Ref Range Status   MRSA, PCR NEGATIVE NEGATIVE Final   Staphylococcus aureus NEGATIVE NEGATIVE Final    Comment:        The Xpert SA Assay (FDA approved for NASAL specimens in patients over 28 years of age), is one component of a comprehensive surveillance program.  Test performance has been validated by [facility] for patients greater than or equal to 68 year old. It is not intended to diagnose infection nor to guide or monitor treatment.     RADIOLOGY:  No results found.  EKG:   Orders placed or performed during the hospital encounter of 04/16/16  . EKG 12-Lead  . EKG 12-Lead  . ED EKG  . ED EKG  . EKG      Management plans discussed with the patient, family and they are in agreement.  CODE STATUS:     Code Status Orders         Start     Ordered   04/20/16 1754  Do not attempt resuscitation (DNR)  Continuous    Question Answer Comment  In the event of cardiac or respiratory ARREST Do not call a "code blue"   In the event of cardiac or respiratory ARREST Do not perform Intubation, CPR, defibrillation or ACLS   In the event of cardiac or respiratory ARREST Use medication by any route, position, wound care, and other measures to relive pain and suffering. May use oxygen, suction and manual treatment of airway obstruction as needed for comfort.      04/20/16 1754    Code Status History    Date Active Date Inactive Code Status Order ID Comments User Context   11/30/2015  6:43 AM 12/01/2015  8:00 PM Full Code [id number]  Harrie Foreman, MD Inpatient   11/03/2015 10:22 PM 11/04/2015  1:17 PM Full Code [id number]  Vaughan Basta, MD Inpatient   11/03/2015 10:16 PM 11/03/2015 10:22 PM Full Code [id number]  Dereck Leep, MD ED      TOTAL TIME TAKING CARE OF THIS PATIENT: 40 minutes.    Theodoro Grist M.D on 04/24/2016 at 9:12 AM  Between 7am to 6pm - Pager - [phone]  After 6pm go to [email or web address] - password [email or web address] [facility]  [place] [facility]  Office  [phone]  CC: Primary care physician; Leonel Ramsay, MD

## 2016-04-24 NOTE — NC FL2 (Signed)
[place] LEVEL OF CARE SCREENING TOOL     IDENTIFICATION  Patient Name: Jorge Moore Birthdate: Oct 21, 1923 Sex: male Admission Date (Current Location): 04/20/2016  [facility] and [place] Number:  Engineering geologist and Address:  [facility], [address] [place], [place] [address]      Provider Number: [id number]  Attending Physician Name and Address:  Theodoro Grist, MD  Relative Name and Phone Number:       Current Level of Care: Hospital Recommended Level of Care: [place] Prior Approval Number:    Date Approved/Denied:   PASRR Number:  ( [id number] )  Discharge Plan: Domiciliary (Rest home)    Current Diagnoses: Patient Active Problem List   Diagnosis Date Noted  . Intertrochanteric fracture of left hip ([place]) 04/23/2016  . S/P ORIF (open reduction internal fixation) fracture 04/23/2016  . Dementia 04/23/2016  . Anemia 04/23/2016  . Hyperglycemia 04/23/2016  . Hypotension 04/23/2016  . Closed left hip fracture ([place]) 04/20/2016  . Syncope 11/30/2015  . Atrial fibrillation ([place]) 11/11/2015  . Degeneration of intervertebral disc of lumbosacral region 11/11/2015  . Atheroma, skin 11/11/2015  . Hip fracture, right ([place]) 11/03/2015  . Hip fracture ([place]) 11/03/2015  . Cerebral ventriculomegaly 07/22/2015  . Abnormal computed tomography of head 05/25/2015  . Fall 10/05/2014  . Appendicular ataxia 10/05/2014  . 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 Pulmonary embolism ([place]) 11/16/2010    Orientation RESPIRATION BLADDER Height & Weight     Self  O2 (2 Liters Oxygen ) Incontinent Weight: 125 lb (56.7 kg) Height:  5\' 8"  (172.7 cm)  BEHAVIORAL SYMPTOMS/MOOD NEUROLOGICAL BOWEL NUTRITION STATUS   (none )  (none ) Continent eating dysphagia 1 diet with thin liquids  AMBULATORY STATUS COMMUNICATION OF NEEDS Skin   Extensive Assist (Wheel Chair Bound ) Verbally Surgical wounds (Incision: Left  Hip. )                       Personal Care Assistance Level of Assistance  Bathing, Feeding, Dressing Bathing Assistance: Limited assistance Feeding assistance: Limited assistance Dressing Assistance: Limited assistance     Functional Limitations Info  Sight, Speech, Hearing Sight Info: Adequate Hearing Info: Adequate Speech Info: Adequate    SPECIAL CARE FACTORS FREQUENCY   Will be followed by [facility]. Hospice RN will administer the Lovenox injections once per day for 14 days.                     Contractures      Additional Factors Info  Code Status, Allergies Code Status Info:  (DNR ) Allergies Info:  (Penicillins)          Discharge Medications: Please see discharge summary for a list of discharge medications. Current Discharge Medication List       START taking these medications   Details  albuterol (PROVENTIL) (2.5 MG/3ML) 0.083% nebulizer solution Take 3 mLs (2.5 mg total) by nebulization every 6 (six) hours as needed for wheezing or shortness of breath. Qty: 75 mL, Refills: 12    docusate sodium (COLACE) 100 MG capsule Take 1 capsule (100 mg total) by mouth 2 (two) times daily. Qty: 10 capsule, Refills: 0    enoxaparin (LOVENOX) 40 MG/0.4ML injection Inject 0.4 mLs (40 mg total) into the skin daily. Qty: 14 Syringe, Refills: 0    nystatin (MYCOSTATIN) 100000 UNIT/ML suspension Use as directed 5 mLs (500,000 Units total) in the mouth  or throat 4 (four) times daily as needed (as needed). Qty: 60 mL, Refills: 0    oxyCODONE (OXY IR/ROXICODONE) 5 MG immediate release tablet Take 1 tablet (5 mg total) by mouth every 4 (four) hours as needed for moderate pain. Qty: 30 tablet, Refills: 0    polyethylene glycol (MIRALAX / GLYCOLAX) packet Take 17 g by mouth daily as needed for mild constipation. Qty: 14 each, Refills: 0         CONTINUE these medications which have CHANGED   Details  Melatonin 3 MG TABS Take 1 tablet  (3 mg total) by mouth at bedtime as needed (sleep). Qty: 30 tablet, Refills: 0    morphine (ROXANOL) 20 MG/ML concentrated solution Take 0.25 mLs (5 mg total) by mouth every hour as needed for severe pain or shortness of breath. Qty: 30 mL, Refills: 0    traMADol (ULTRAM) 50 MG tablet Take 1 tablet (50 mg total) by mouth 3 (three) times daily. Qty: 30 tablet, Refills: 0         CONTINUE these medications which have NOT CHANGED   Details  acetaminophen (TYLENOL) 500 MG tablet Take 500 mg by mouth 3 (three) times daily.    aspirin 81 MG chewable tablet Chew 81 mg by mouth daily. Reported on 11/30/2015    Cholecalciferol (VITAMIN D) 2000 units tablet Take 4,000 Units by mouth daily.    donepezil (ARICEPT) 10 MG tablet Take 10 mg by mouth daily.     feeding supplement, ENSURE ENLIVE, (ENSURE ENLIVE) LIQD Take 237 mLs by mouth 2 (two) times daily as needed.    memantine (NAMENDA) 10 MG tablet Take 10 mg by mouth daily.    Oyster Shell (OYSTER CALCIUM) 500 MG TABS tablet Take 500 mg of elemental calcium by mouth 2 (two) times daily.    senna (SENOKOT) 8.6 MG TABS tablet Take 1 tablet by mouth every other day.     Relevant Imaging Results: Relevant Lab Results: Additional Information  (SSN: [id number])  Rozalynn Buege, Veronia Beets, LCSW

## 2016-04-24 NOTE — Progress Notes (Signed)
Visit made. Patient appeared to be sleeping, breakfast tray at bedside. Patient easily awakened. Writer fed him, he ate well, no swallowing difficulties. Staff CNA in during visit and continued feeding. Per discussion with CSW Mel Almond Sample family has chosen for Jorge Moore to return to [facility] with hospice services continuing. Hospice nurse to give daily Lovenox injections. Hospice team updated. Discharge summary faxed to triage. Thank you. Flo Shanks RN, BSN, [place] [facility] [place], [facility] [phone] c

## 2016-04-24 NOTE — Progress Notes (Signed)
Physical Therapy Treatment Patient Details Name: Jorge Moore MRN: [id number] DOB: March 06, 1924 Today's Date: 04/24/2016    History of Present Illness Pt is a 82 yr old male presenting from [facility] following a fall from his w/c. Pt was found to have a L intertrochanteric fx and is s/p IM Nailing 8/5. PMH significant for multiple falls, dementia, ataxia, AFib, GERD, prostate CA, and h/o R hip IMN 2/17.     PT Comments    Pt presents this date with increased alertness/participation, but remains significantly limited by LLE pain. Pt was able to participate in supine TherEx as detailed below, and was assisted to sitting EOB with max A x2. Pt was requiring min-mod A to maintain balance sitting EOB, 2/2 strong rightward lean to offweight L hip. Pt with difficulty understanding the VAS for pain rating, but does endorse that it is "a lot" with any LLE ROM or mobility. RN notified.   Follow Up Recommendations  Supervision/Assistance - 24 hour;Home health PT     Equipment Recommendations   (will further assess with OOB)    Recommendations for Other Services       Precautions / Restrictions Precautions Precautions: Fall Restrictions Weight Bearing Restrictions: Yes LLE Weight Bearing: Weight bearing as tolerated    Mobility  Bed Mobility Overal bed mobility: Needs Assistance Bed Mobility: Rolling;Supine to Sit;Sit to Supine Rolling: Min assist (R x 3 trials) Supine to sit: Max assist;+2 for physical assistance Sit to supine: Max assist;+2 for physical assistance General bed mobility comments: Log roll R x3 trials. Sit <> supine with max A x2 2/2 L hip pain and generalized weakness. Pt with c/o "a lot" of L hip pain with all bed mobility, and sits EOB with rightward lean to offweight L hip. Pain decreases to "a little" when back in supine. Max A x2 for scoot up in bed.  Transfers General transfer comment: Unsafe to assess 2/2 assist levels and pain  Ambulation/Gait General Gait  Details: Unsafe to assess 2/2 assist levels and pain   Stairs    Wheelchair Mobility    Modified Rankin (Stroke Patients Only)       Balance Overall balance assessment: Needs assistance Sitting-balance support: Bilateral upper extremity supported;Feet supported Sitting balance-Leahy Scale: Poor Sitting balance - Comments: Pt requiring min-mod A to maintain balance sitting EOB.  Pt with rightward lean to offweight L hip.  Postural control: Right lateral lean    Cognition Arousal/Alertness: Lethargic (Increased alertness from prior session, though still letharg) Behavior During Therapy: Flat affect Overall Cognitive Status: No family/caregiver present to determine baseline cognitive functioning    Exercises General Exercises - Lower Extremity Ankle Circles/Pumps: AROM;Both;10 reps;Supine Quad Sets: AROM;Both;10 reps;Supine Short Arc Quad: AROM;AAROM;Both;10 reps;Supine (AROM RLE; AAROM LLE (75% assist)) Heel Slides: AROM;Right;10 reps;Supine (deferred LLE d/t pain) Hip ABduction/ADduction: AROM;AAROM;Both;10 reps;Supine (AROM RLE; AAROM LLE (50% assist)) Straight Leg Raises: AROM;AAROM;Both;10 reps;Supine (AROM RLE; AAROM LLE through small range (75% assist))    General Comments General comments (skin integrity, edema, etc.): Two small honeycomb bandages intact L hip, no drainage noted.      Pertinent Vitals/Pain Pain Assessment: 0-10 Pain Score:  ("A lot" of pain with mobility, "a little" pain at rest) Pain Location: L hip Pain Descriptors / Indicators: Aching;Sore Pain Intervention(s): Limited activity within patient's tolerance;Monitored during session (RN notified and to administer pain meds)  HR and O2 monitored throughout session and maintained WFL.            PT Goals (current goals  can now be found in the care plan section) Acute Rehab PT Goals PT Goal Formulation: With patient Time For Goal Achievement: 05/07/16 Potential to Achieve Goals: Fair Progress  towards PT goals: Progressing toward goals    Frequency  7X/week    PT Plan Current plan remains appropriate       End of Session Equipment Utilized During Treatment: Oxygen (2L O2) Activity Tolerance: Patient limited by lethargy;Patient limited by pain Patient left: in bed;with call bell/phone within reach;with bed alarm set;with SCD's reapplied (bilat heels elevated via pillow)  Nursing Communication: mobility status; precautions; pt pain levels     Time: [id number] PT Time Calculation (min) (ACUTE ONLY): 26 min  Charges:                       G Codes:      Kaymarie Wynn, SPT 04/24/2016, 9:39 AM

## 2016-04-24 NOTE — Progress Notes (Signed)
Patient is medically stable for D/C back to [facility] today with hospice care. Per [facility] RN will give patient the Lovenox injections once per day for 14 days. Per Tia at [facility] patient can return today. Per staff member at [facility] patient's oxygen is already set up. Clinical Education officer, museum (CSW) sent D/C orders to [facility]. Patient's daughter Judeth Porch is aware of above and in agreement with plan. [facility] liaison is aware of above. RN will arrange EMS for transport. Please reconsult if future social work needs arise. CSW signing off.   [employer], LCSW [phone]

## 2016-05-18 DEATH — deceased

## 2016-07-18 DEATH — deceased

## 2017-09-27 IMAGING — CR DG HIP (WITH PELVIS) OPERATIVE*R*
6 series · 6 of 6 positions shown · non-contrast
Comparison: preoperative radiographs 11/03/2015

CLINICAL DATA: 89-year-old male status post right hip fixation

EXAM:
OPERATIVE RIGHT HIP (WITH PELVIS IF PERFORMED) 6 VIEWS
TECHNIQUE: Fluoroscopic spot image(s) were submitted for interpretation
post-operatively.

[cont. (1 of 6)]
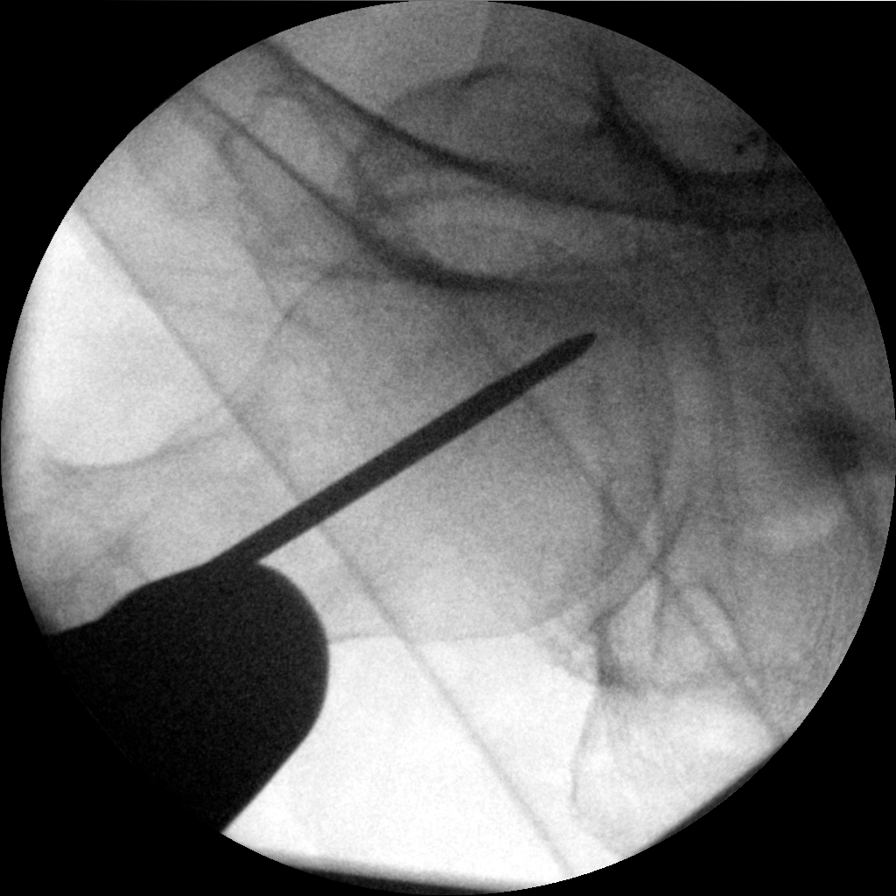

[cont. (2 of 6)]
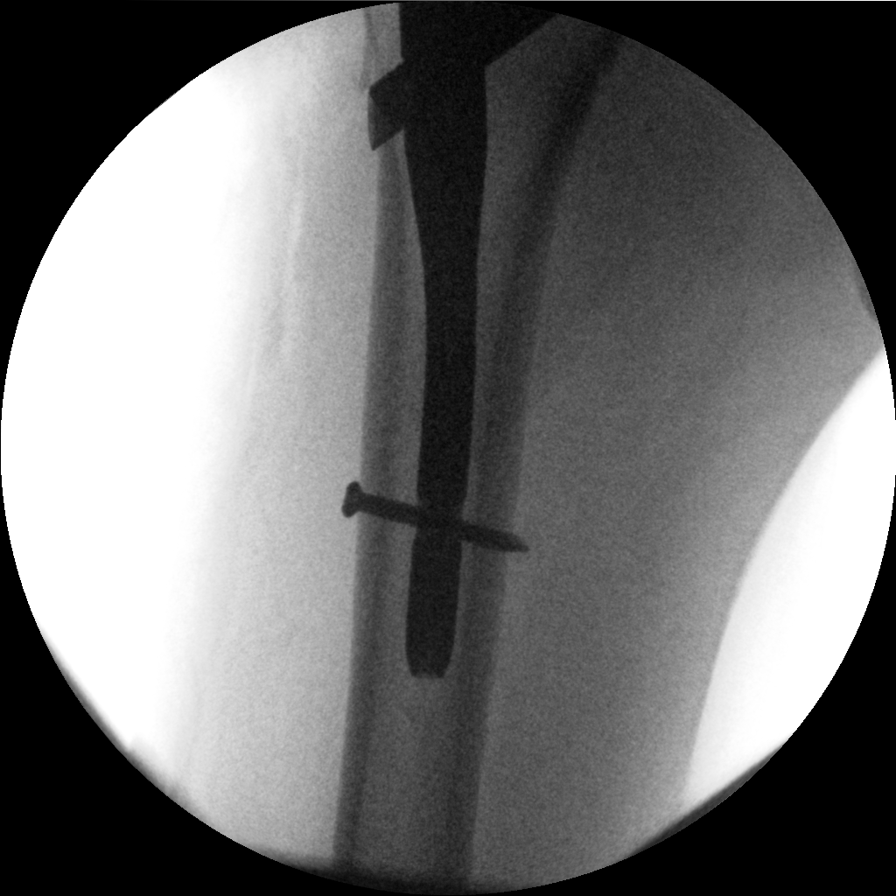

[cont. (3 of 6)]
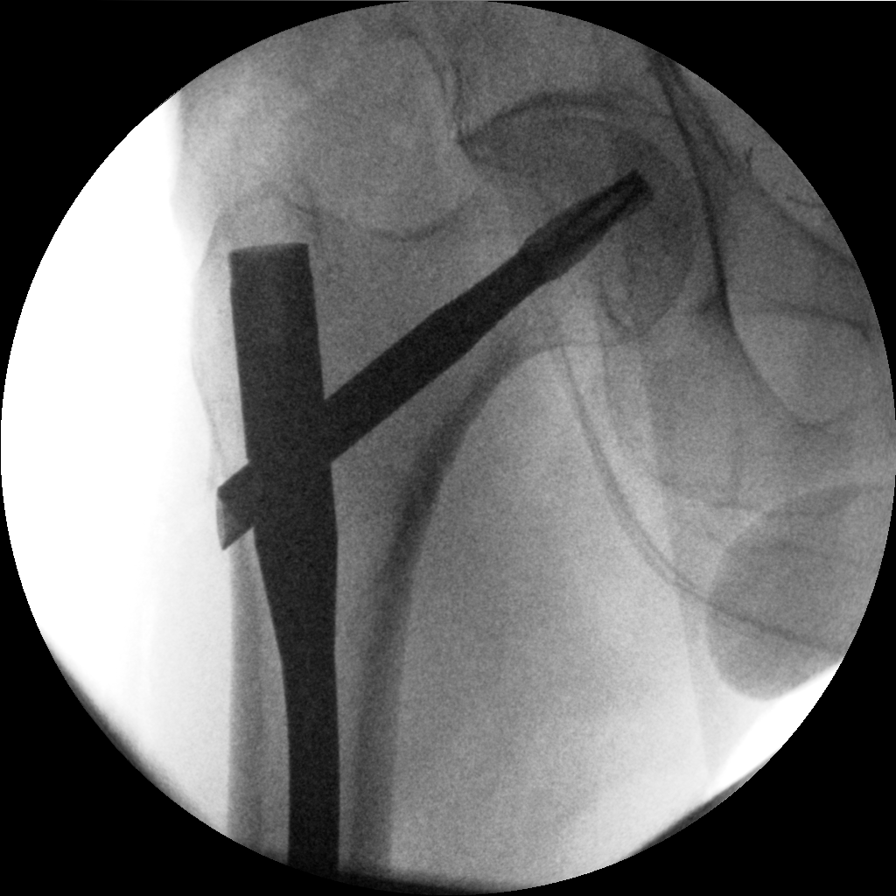

[cont. (4 of 6)]
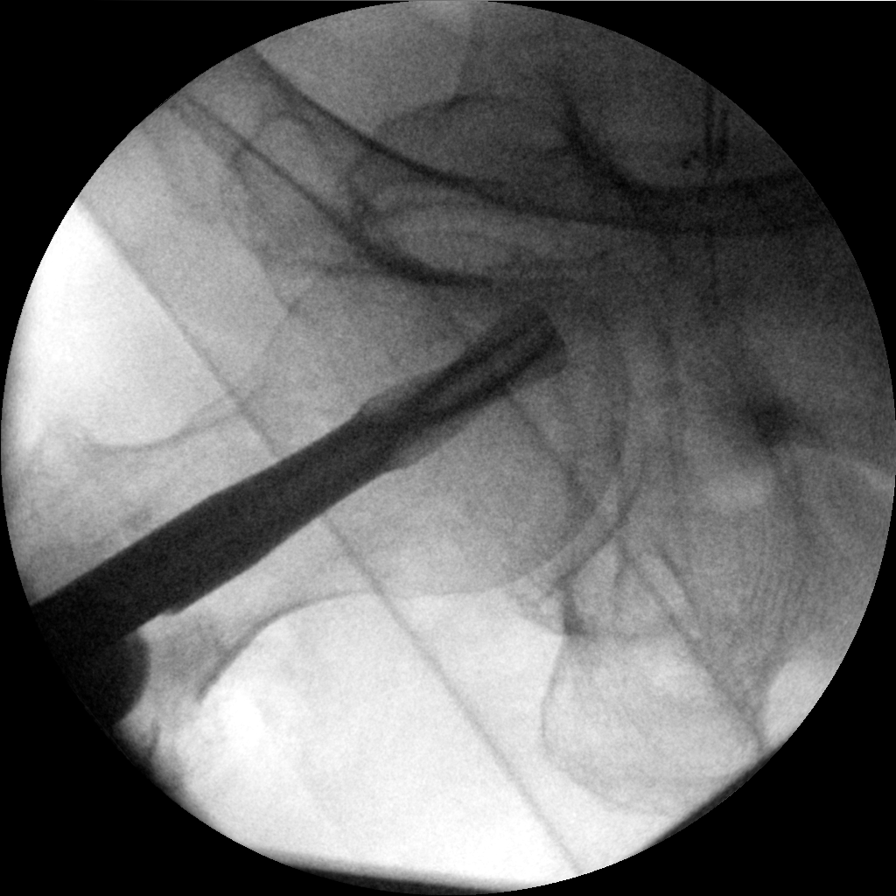

[cont. (5 of 6)]
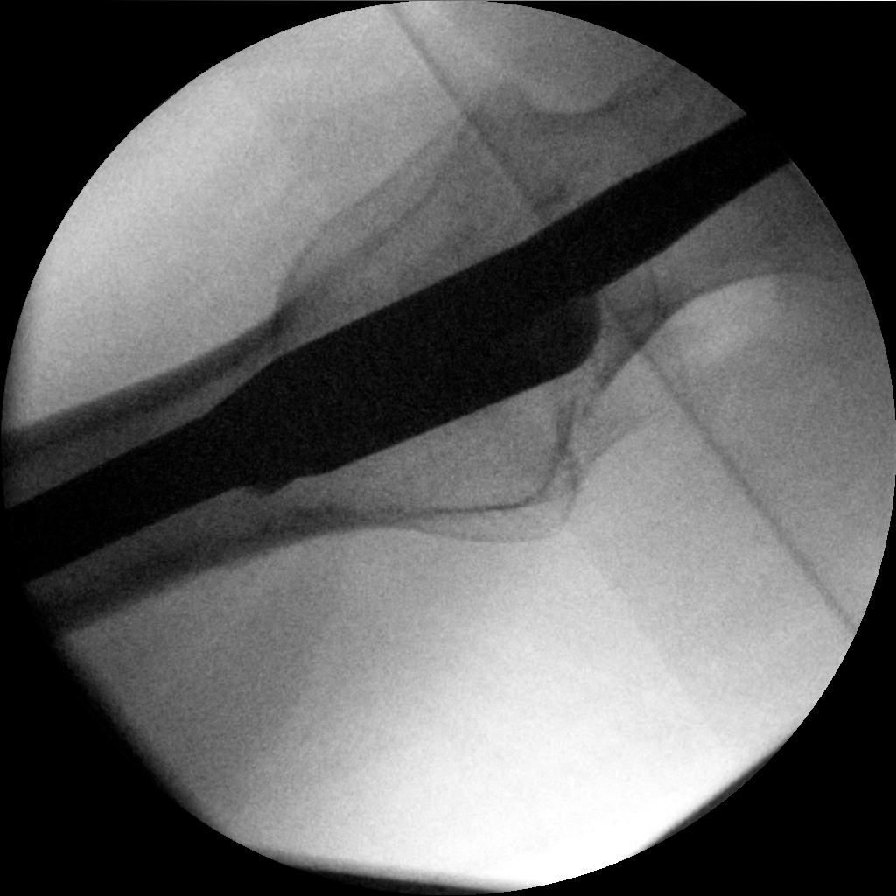

[cont. (6 of 6)]
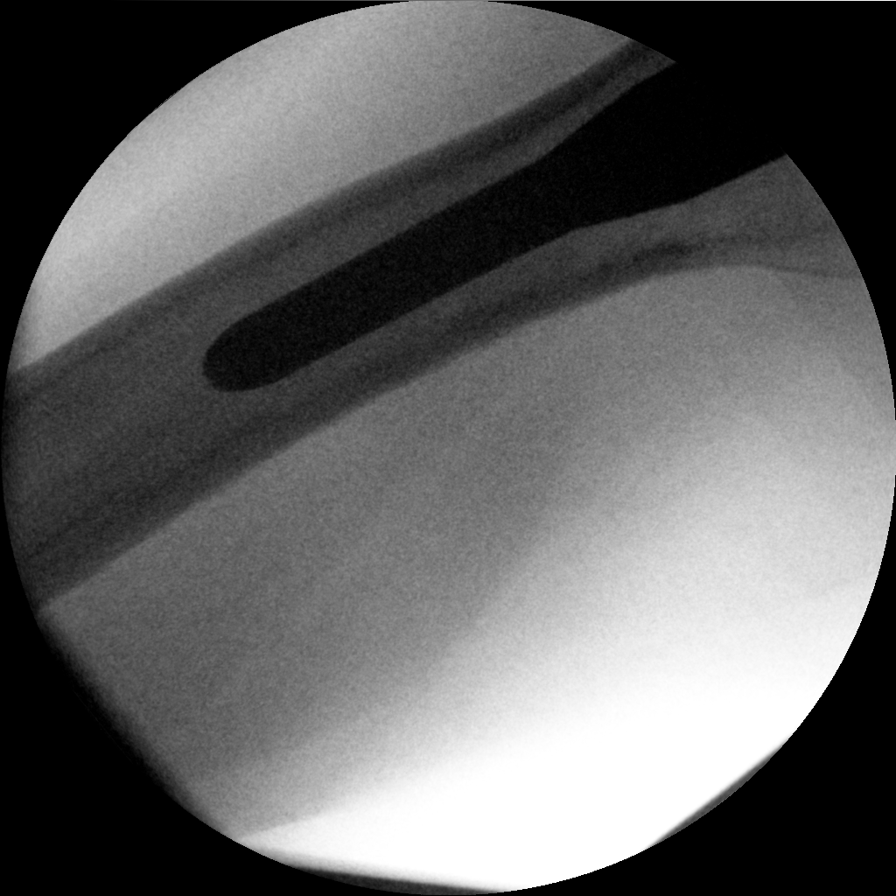

[6 of 6 positions shown; findings below may reference images not displayed]

FINDINGS: Six intraoperative spot radiographs demonstrate fixation of the
right intertrochanteric fracture with a proximal age medullary nail
including a single distal interlocking screw and a transfemoral neck
gamma nail. No evidence of immediate hardware complication.
Alignment appears anatomic.
IMPRESSION: ORIF right intertrochanteric fracture as described above without
evidence of acute hardware complication.
